# Patient Record
Sex: Male | Born: 1960 | Race: White | Hispanic: No | Marital: Single | Smoking: Former smoker
Health system: Southern US, Community
[De-identification: ages and names within clinical notes are randomized; demographics above are authoritative.]

## PROBLEM LIST (undated history)

## (undated) DIAGNOSIS — I4892 Unspecified atrial flutter: Secondary | ICD-10-CM

## (undated) DIAGNOSIS — C801 Malignant (primary) neoplasm, unspecified: Secondary | ICD-10-CM

## (undated) DIAGNOSIS — E039 Hypothyroidism, unspecified: Secondary | ICD-10-CM

## (undated) DIAGNOSIS — R0600 Dyspnea, unspecified: Secondary | ICD-10-CM

## (undated) DIAGNOSIS — E079 Disorder of thyroid, unspecified: Secondary | ICD-10-CM

## (undated) HISTORY — PX: HERNIA REPAIR: SHX51

## (undated) HISTORY — PX: APPENDECTOMY: SHX54

---

## 2005-10-11 ENCOUNTER — Ambulatory Visit: Payer: Self-pay | Admitting: Family Medicine

## 2005-10-25 ENCOUNTER — Ambulatory Visit: Payer: Self-pay | Admitting: Cardiology

## 2005-11-08 ENCOUNTER — Ambulatory Visit: Payer: Self-pay

## 2009-11-21 ENCOUNTER — Emergency Department (HOSPITAL_COMMUNITY): Admission: EM | Admit: 2009-11-21 | Discharge: 2009-11-22 | Payer: Self-pay | Admitting: Emergency Medicine

## 2010-07-24 LAB — DIFFERENTIAL
Basophils Relative: 0 % (ref 0–1)
Eosinophils Absolute: 0.4 10*3/uL (ref 0.0–0.7)
Eosinophils Relative: 4 % (ref 0–5)
Lymphocytes Relative: 20 % (ref 12–46)
Neutro Abs: 6.2 10*3/uL (ref 1.7–7.7)
Neutrophils Relative %: 68 % (ref 43–77)

## 2010-07-24 LAB — URINALYSIS, ROUTINE W REFLEX MICROSCOPIC
Bilirubin Urine: NEGATIVE
Glucose, UA: NEGATIVE mg/dL
Ketones, ur: NEGATIVE mg/dL
Protein, ur: NEGATIVE mg/dL
Specific Gravity, Urine: 1.025 (ref 1.005–1.030)

## 2010-07-24 LAB — CBC
MCH: 31.2 pg (ref 26.0–34.0)
MCHC: 34.4 g/dL (ref 30.0–36.0)
MCV: 90.7 fL (ref 78.0–100.0)
Platelets: 189 10*3/uL (ref 150–400)
RBC: 4.66 MIL/uL (ref 4.22–5.81)

## 2010-07-24 LAB — COMPREHENSIVE METABOLIC PANEL
AST: 19 U/L (ref 0–37)
BUN: 7 mg/dL (ref 6–23)
Calcium: 9.1 mg/dL (ref 8.4–10.5)
Chloride: 105 mEq/L (ref 96–112)
Creatinine, Ser: 0.94 mg/dL (ref 0.4–1.5)
Glucose, Bld: 112 mg/dL — ABNORMAL HIGH (ref 70–99)
Potassium: 3.5 mEq/L (ref 3.5–5.1)
Total Bilirubin: 0.4 mg/dL (ref 0.3–1.2)
Total Protein: 6.4 g/dL (ref 6.0–8.3)

## 2017-08-22 ENCOUNTER — Ambulatory Visit (HOSPITAL_COMMUNITY)
Admission: RE | Admit: 2017-08-22 | Discharge: 2017-08-22 | Disposition: A | Discharge status: Home / Self Care | Payer: Managed Care, Other (non HMO) | Source: Ambulatory Visit | Attending: Physician Assistant | Admitting: Physician Assistant

## 2017-08-22 ENCOUNTER — Other Ambulatory Visit (HOSPITAL_COMMUNITY): Payer: Self-pay | Admitting: Physician Assistant

## 2017-08-22 DIAGNOSIS — N50819 Testicular pain, unspecified: Secondary | ICD-10-CM

## 2017-08-22 DIAGNOSIS — N5089 Other specified disorders of the male genital organs: Secondary | ICD-10-CM

## 2017-08-24 ENCOUNTER — Other Ambulatory Visit: Payer: Self-pay

## 2017-08-24 ENCOUNTER — Encounter (HOSPITAL_COMMUNITY): Payer: Self-pay | Admitting: *Deleted

## 2017-08-24 ENCOUNTER — Emergency Department (HOSPITAL_COMMUNITY): Payer: Managed Care, Other (non HMO)

## 2017-08-24 ENCOUNTER — Observation Stay (HOSPITAL_COMMUNITY)
Admission: EM | Admit: 2017-08-24 | Discharge: 2017-08-28 | Disposition: A | Discharge status: Home / Self Care | Payer: Managed Care, Other (non HMO) | Attending: Urology | Admitting: Urology

## 2017-08-24 DIAGNOSIS — Z79899 Other long term (current) drug therapy: Secondary | ICD-10-CM | POA: Insufficient documentation

## 2017-08-24 DIAGNOSIS — L02215 Cutaneous abscess of perineum: Secondary | ICD-10-CM | POA: Diagnosis not present

## 2017-08-24 DIAGNOSIS — E039 Hypothyroidism, unspecified: Secondary | ICD-10-CM | POA: Diagnosis not present

## 2017-08-24 DIAGNOSIS — Z87891 Personal history of nicotine dependence: Secondary | ICD-10-CM | POA: Insufficient documentation

## 2017-08-24 DIAGNOSIS — B954 Other streptococcus as the cause of diseases classified elsewhere: Secondary | ICD-10-CM | POA: Diagnosis not present

## 2017-08-24 HISTORY — DX: Disorder of thyroid, unspecified: E07.9

## 2017-08-24 LAB — CBC WITH DIFFERENTIAL/PLATELET
BASOS ABS: 0 10*3/uL (ref 0.0–0.1)
BASOS PCT: 0 %
EOS ABS: 0.3 10*3/uL (ref 0.0–0.7)
EOS PCT: 2 %
HCT: 42.2 % (ref 39.0–52.0)
Hemoglobin: 14.2 g/dL (ref 13.0–17.0)
Lymphocytes Relative: 7 %
Lymphs Abs: 0.9 10*3/uL (ref 0.7–4.0)
MCH: 29.3 pg (ref 26.0–34.0)
MCHC: 33.6 g/dL (ref 30.0–36.0)
MCV: 87.2 fL (ref 78.0–100.0)
MONO ABS: 1.7 10*3/uL — AB (ref 0.1–1.0)
Monocytes Relative: 13 %
Neutro Abs: 10.6 10*3/uL — ABNORMAL HIGH (ref 1.7–7.7)
Neutrophils Relative %: 78 %
PLATELETS: 263 10*3/uL (ref 150–400)
RBC: 4.84 MIL/uL (ref 4.22–5.81)
RDW: 12.8 % (ref 11.5–15.5)
WBC: 13.5 10*3/uL — AB (ref 4.0–10.5)

## 2017-08-24 LAB — COMPREHENSIVE METABOLIC PANEL
ALBUMIN: 3.1 g/dL — AB (ref 3.5–5.0)
ALT: 43 U/L (ref 17–63)
AST: 25 U/L (ref 15–41)
Alkaline Phosphatase: 88 U/L (ref 38–126)
Anion gap: 12 (ref 5–15)
BUN: 15 mg/dL (ref 6–20)
CHLORIDE: 96 mmol/L — AB (ref 101–111)
CO2: 28 mmol/L (ref 22–32)
Calcium: 9 mg/dL (ref 8.9–10.3)
Creatinine, Ser: 0.95 mg/dL (ref 0.61–1.24)
GFR calc Af Amer: >60 mL/min (ref >60)
Glucose, Bld: 144 mg/dL — ABNORMAL HIGH (ref 65–99)
POTASSIUM: 4 mmol/L (ref 3.5–5.1)
SODIUM: 136 mmol/L (ref 135–145)
Total Bilirubin: 0.6 mg/dL (ref 0.3–1.2)
Total Protein: 7.6 g/dL (ref 6.5–8.1)

## 2017-08-24 MED ORDER — VANCOMYCIN HCL 1000 MG IV SOLR
INTRAVENOUS | Status: AC
  Filled 2017-08-24: qty 2000

## 2017-08-24 MED ORDER — MORPHINE SULFATE (PF) 4 MG/ML IV SOLN
4.0000 mg | Freq: Once | INTRAVENOUS | Status: AC
  Administered 2017-08-24: 4 mg via INTRAVENOUS
  Filled 2017-08-24: qty 1

## 2017-08-24 MED ORDER — SODIUM CHLORIDE 0.9 % IV BOLUS
1000.0000 mL | Freq: Once | INTRAVENOUS | Status: AC
  Administered 2017-08-24: 1000 mL via INTRAVENOUS

## 2017-08-24 MED ORDER — ONDANSETRON HCL 4 MG/2ML IJ SOLN
4.0000 mg | Freq: Once | INTRAMUSCULAR | Status: AC
  Administered 2017-08-24: 4 mg via INTRAVENOUS
  Filled 2017-08-24: qty 2

## 2017-08-24 MED ORDER — VANCOMYCIN HCL 10 G IV SOLR
1750.0000 mg | Freq: Once | INTRAVENOUS | Status: AC
  Administered 2017-08-24: 1750 mg via INTRAVENOUS
  Filled 2017-08-24: qty 1750

## 2017-08-24 MED ORDER — IOPAMIDOL (ISOVUE-300) INJECTION 61%
100.0000 mL | Freq: Once | INTRAVENOUS | Status: AC | PRN
  Administered 2017-08-24: 100 mL via INTRAVENOUS

## 2017-08-24 MED ORDER — VANCOMYCIN HCL 10 G IV SOLR
INTRAVENOUS | Status: AC
  Filled 2017-08-24: qty 1500

## 2017-08-24 MED ORDER — SODIUM CHLORIDE 0.9 % IV SOLN
1250.0000 mg | Freq: Two times a day (BID) | INTRAVENOUS | Status: DC
  Filled 2017-08-24: qty 1250

## 2017-08-24 MED ORDER — VANCOMYCIN HCL 500 MG IV SOLR
INTRAVENOUS | Status: AC
  Filled 2017-08-24: qty 500

## 2017-08-24 NOTE — Progress Notes (Signed)
[facility]  Abscess extending from base of penis along the scrotum and inferior to the prostate. This is more related to the penis/ scrotum and not really related to the anus/ rectum based on this CT.  Spoke with the radiologist. Spoke to the ED. Personally reviewed the images.   Curlene Labrum, MD [facility] [address] [place], [place] [address] [phone] (office)

## 2017-08-24 NOTE — Progress Notes (Signed)
[facility]  Called by ED. Patient with what appears to me to be a base of the penis and the testicles are not even on the film.   Need to see the testicles and the rectum, all of which are not on the film.  Spoke with the CT tech who is going to complete the film and call me back regarding the images. The images will be added to the prior CT and will be addendum from radiology.  Will speak with ED following this completion.   Curlene Labrum, MD [facility] [address] [place], [place] [address] [phone] (office)

## 2017-08-24 NOTE — ED Notes (Signed)
Pt transported back to ct,

## 2017-08-24 NOTE — ED Notes (Signed)
Pt returned from xray,  

## 2017-08-24 NOTE — ED Triage Notes (Signed)
Groin pain, onset a week  Ago, has been diagnosed with a hernia

## 2017-08-24 NOTE — Progress Notes (Signed)
Pharmacy Antibiotic Note  Paul Mclean is a 57 y.o. male admitted on 08/24/2017 with cellulitis and abscess.  Pharmacy has been consulted for Vancomycin dosing.  Plan: Vancomycin 1750 mg IV x 1 dose Vancomycin 1250 mg IV every 12 hours.  Goal trough 15-20 mcg/mL.  Monitor labs, c/s, and vanco trough as indicated.  Height: 5\' 7"  (170.2 cm) Weight: 210 lb (95.3 kg) IBW/kg (Calculated) : 66.1  Temp (24hrs), Avg:98.5 F (36.9 C), Min:98.5 F (36.9 C), Max:98.5 F (36.9 C)  Recent Labs  Lab 08/24/17 1634  WBC 13.5*  CREATININE 0.95    Estimated Creatinine Clearance: 96.7 mL/min (by C-G formula based on SCr of 0.95 mg/dL).    No Known Allergies  Antimicrobials this admission: Vanco 4/18 >>      Dose adjustments this admission: N/A  Microbiology results: No pending micro results  Thank you for allowing pharmacy to be a part of this patient's care.  Ramond Craver 08/24/2017 9:48 PM

## 2017-08-25 ENCOUNTER — Other Ambulatory Visit: Payer: Self-pay

## 2017-08-25 ENCOUNTER — Encounter (HOSPITAL_COMMUNITY): Payer: Self-pay | Admitting: *Deleted

## 2017-08-25 ENCOUNTER — Encounter (HOSPITAL_COMMUNITY)
Admission: EM | Disposition: A | Discharge status: Home / Self Care | Payer: Self-pay | Source: Home / Self Care | Attending: Emergency Medicine

## 2017-08-25 ENCOUNTER — Observation Stay (HOSPITAL_COMMUNITY): Payer: Managed Care, Other (non HMO) | Admitting: Certified Registered Nurse Anesthetist

## 2017-08-25 DIAGNOSIS — Z79899 Other long term (current) drug therapy: Secondary | ICD-10-CM | POA: Diagnosis not present

## 2017-08-25 DIAGNOSIS — L02215 Cutaneous abscess of perineum: Secondary | ICD-10-CM | POA: Diagnosis present

## 2017-08-25 DIAGNOSIS — E039 Hypothyroidism, unspecified: Secondary | ICD-10-CM | POA: Diagnosis not present

## 2017-08-25 DIAGNOSIS — B954 Other streptococcus as the cause of diseases classified elsewhere: Secondary | ICD-10-CM | POA: Diagnosis not present

## 2017-08-25 DIAGNOSIS — Z87891 Personal history of nicotine dependence: Secondary | ICD-10-CM | POA: Diagnosis not present

## 2017-08-25 HISTORY — PX: INCISION AND DRAINAGE ABSCESS: SHX5864

## 2017-08-25 LAB — URINALYSIS, ROUTINE W REFLEX MICROSCOPIC
BILIRUBIN URINE: NEGATIVE
Glucose, UA: NEGATIVE mg/dL
HGB URINE DIPSTICK: NEGATIVE
Ketones, ur: NEGATIVE mg/dL
Leukocytes, UA: NEGATIVE
NITRITE: NEGATIVE
PROTEIN: NEGATIVE mg/dL
pH: 5 (ref 5.0–8.0)

## 2017-08-25 SURGERY — INCISION AND DRAINAGE, ABSCESS
Anesthesia: General | Site: Perineum

## 2017-08-25 MED ORDER — DEXTROSE-NACL 5-0.45 % IV SOLN: INTRAVENOUS | Status: DC

## 2017-08-25 MED ORDER — FENTANYL CITRATE (PF) 100 MCG/2ML IJ SOLN
INTRAMUSCULAR | Status: AC
  Filled 2017-08-25: qty 2

## 2017-08-25 MED ORDER — VANCOMYCIN HCL IN DEXTROSE 1-5 GM/200ML-% IV SOLN
1000.0000 mg | Freq: Two times a day (BID) | INTRAVENOUS | Status: DC
  Administered 2017-08-25: 1000 mg via INTRAVENOUS
  Filled 2017-08-25: qty 200

## 2017-08-25 MED ORDER — OXYCODONE HCL 5 MG/5ML PO SOLN
5.0000 mg | Freq: Once | ORAL | Status: DC | PRN
  Filled 2017-08-25: qty 5

## 2017-08-25 MED ORDER — LACTATED RINGERS IV SOLN: INTRAVENOUS | Status: DC

## 2017-08-25 MED ORDER — PROPOFOL 10 MG/ML IV BOLUS
INTRAVENOUS | Status: AC
  Filled 2017-08-25: qty 20

## 2017-08-25 MED ORDER — MIDAZOLAM HCL 2 MG/2ML IJ SOLN
INTRAMUSCULAR | Status: AC
  Filled 2017-08-25: qty 2

## 2017-08-25 MED ORDER — PROMETHAZINE HCL 25 MG/ML IJ SOLN: 6.2500 mg | INTRAMUSCULAR | Status: DC | PRN

## 2017-08-25 MED ORDER — FENTANYL CITRATE (PF) 100 MCG/2ML IJ SOLN
25.0000 ug | INTRAMUSCULAR | Status: DC | PRN
  Administered 2017-08-25: 25 ug via INTRAVENOUS

## 2017-08-25 MED ORDER — SUCCINYLCHOLINE CHLORIDE 200 MG/10ML IV SOSY
PREFILLED_SYRINGE | INTRAVENOUS | Status: DC | PRN
  Administered 2017-08-25: 120 mg via INTRAVENOUS

## 2017-08-25 MED ORDER — MIDAZOLAM HCL 5 MG/5ML IJ SOLN
INTRAMUSCULAR | Status: DC | PRN
  Administered 2017-08-25: 2 mg via INTRAVENOUS

## 2017-08-25 MED ORDER — ONDANSETRON HCL 4 MG/2ML IJ SOLN: 4.0000 mg | INTRAMUSCULAR | Status: DC | PRN

## 2017-08-25 MED ORDER — SCOPOLAMINE 1 MG/3DAYS TD PT72
MEDICATED_PATCH | TRANSDERMAL | Status: AC
  Filled 2017-08-25: qty 1

## 2017-08-25 MED ORDER — DOCUSATE SODIUM 100 MG PO CAPS
100.0000 mg | ORAL_CAPSULE | Freq: Two times a day (BID) | ORAL | Status: DC
  Administered 2017-08-25 – 2017-08-27 (×6): 100 mg via ORAL
  Filled 2017-08-25 (×7): qty 1

## 2017-08-25 MED ORDER — LIDOCAINE 2% (20 MG/ML) 5 ML SYRINGE
INTRAMUSCULAR | Status: DC | PRN
  Administered 2017-08-25: 80 mg via INTRAVENOUS

## 2017-08-25 MED ORDER — LACTATED RINGERS IV SOLN
INTRAVENOUS | Status: DC | PRN
  Administered 2017-08-25: 05:00:00 via INTRAVENOUS

## 2017-08-25 MED ORDER — MORPHINE SULFATE (PF) 2 MG/ML IV SOLN
2.0000 mg | INTRAVENOUS | Status: DC | PRN
  Administered 2017-08-25 – 2017-08-27 (×3): 2 mg via INTRAVENOUS
  Filled 2017-08-25 (×3): qty 1

## 2017-08-25 MED ORDER — LIDOCAINE 2% (20 MG/ML) 5 ML SYRINGE
INTRAMUSCULAR | Status: AC
  Filled 2017-08-25: qty 5

## 2017-08-25 MED ORDER — ONDANSETRON HCL 4 MG/2ML IJ SOLN
INTRAMUSCULAR | Status: AC
  Filled 2017-08-25: qty 2

## 2017-08-25 MED ORDER — ACETAMINOPHEN 325 MG PO TABS: 650.0000 mg | ORAL_TABLET | ORAL | Status: DC | PRN

## 2017-08-25 MED ORDER — OXYCODONE HCL 5 MG PO TABS: 5.0000 mg | ORAL_TABLET | Freq: Once | ORAL | Status: DC | PRN

## 2017-08-25 MED ORDER — FENTANYL CITRATE (PF) 100 MCG/2ML IJ SOLN
INTRAMUSCULAR | Status: DC | PRN
  Administered 2017-08-25: 100 ug via INTRAVENOUS
  Administered 2017-08-25: 50 ug via INTRAVENOUS

## 2017-08-25 MED ORDER — PIPERACILLIN-TAZOBACTAM 3.375 G IVPB
3.3750 g | Freq: Three times a day (TID) | INTRAVENOUS | Status: DC
  Administered 2017-08-25 – 2017-08-28 (×8): 3.375 g via INTRAVENOUS
  Filled 2017-08-25 (×8): qty 50

## 2017-08-25 MED ORDER — SCOPOLAMINE 1 MG/3DAYS TD PT72
MEDICATED_PATCH | TRANSDERMAL | Status: DC | PRN
  Administered 2017-08-25: 1 via TRANSDERMAL

## 2017-08-25 MED ORDER — ONDANSETRON HCL 4 MG/2ML IJ SOLN
INTRAMUSCULAR | Status: DC | PRN
  Administered 2017-08-25: 4 mg via INTRAVENOUS

## 2017-08-25 MED ORDER — 0.9 % SODIUM CHLORIDE (POUR BTL) OPTIME
TOPICAL | Status: DC | PRN
  Administered 2017-08-25: 1000 mL

## 2017-08-25 MED ORDER — OXYCODONE HCL 5 MG PO TABS
5.0000 mg | ORAL_TABLET | ORAL | Status: DC | PRN
  Administered 2017-08-25 – 2017-08-28 (×3): 5 mg via ORAL
  Filled 2017-08-25 (×3): qty 1

## 2017-08-25 MED ORDER — SENNA 8.6 MG PO TABS
1.0000 | ORAL_TABLET | Freq: Two times a day (BID) | ORAL | Status: DC
  Administered 2017-08-25 – 2017-08-28 (×7): 8.6 mg via ORAL
  Filled 2017-08-25 (×7): qty 1

## 2017-08-25 MED ORDER — VANCOMYCIN HCL IN DEXTROSE 750-5 MG/150ML-% IV SOLN
750.0000 mg | Freq: Two times a day (BID) | INTRAVENOUS | Status: DC
  Administered 2017-08-25 – 2017-08-27 (×5): 750 mg via INTRAVENOUS
  Filled 2017-08-25 (×6): qty 150

## 2017-08-25 MED ORDER — PROPOFOL 10 MG/ML IV BOLUS
INTRAVENOUS | Status: DC | PRN
  Administered 2017-08-25: 150 mg via INTRAVENOUS
  Administered 2017-08-25: 50 mg via INTRAVENOUS

## 2017-08-25 SURGICAL SUPPLY — 23 items
BLADE HEX COATED 2.75 (ELECTRODE) ×2 IMPLANT
BLADE SURG 15 STRL LF DISP TIS (BLADE) ×1 IMPLANT
BLADE SURG 15 STRL SS (BLADE) ×2
COVER SURGICAL LIGHT HANDLE (MISCELLANEOUS) ×2 IMPLANT
ELECT PENCIL ROCKER SW 15FT (MISCELLANEOUS) ×2 IMPLANT
ELECT REM PT RETURN 15FT ADLT (MISCELLANEOUS) ×2 IMPLANT
GAUZE SPONGE 4X4 12PLY STRL (GAUZE/BANDAGES/DRESSINGS) ×2 IMPLANT
GAUZE SPONGE 4X4 16PLY XRAY LF (GAUZE/BANDAGES/DRESSINGS) ×2 IMPLANT
GLOVE BIOGEL PI IND STRL 7.0 (GLOVE) ×1 IMPLANT
GLOVE BIOGEL PI INDICATOR 7.0 (GLOVE) ×1
GOWN STRL REUS W/TWL LRG LVL3 (GOWN DISPOSABLE) ×2 IMPLANT
GOWN STRL REUS W/TWL XL LVL3 (GOWN DISPOSABLE) ×4 IMPLANT
KIT BASIN OR (CUSTOM PROCEDURE TRAY) ×2 IMPLANT
LUBRICANT JELLY K Y 4OZ (MISCELLANEOUS) IMPLANT
NEEDLE HYPO 25X1 1.5 SAFETY (NEEDLE) IMPLANT
PACK LITHOTOMY IV (CUSTOM PROCEDURE TRAY) ×2 IMPLANT
PAD ABD 7.5X8 STRL (GAUZE/BANDAGES/DRESSINGS) ×2 IMPLANT
SOL PREP PROV IODINE SCRUB 4OZ (MISCELLANEOUS) ×2 IMPLANT
SWAB COLLECTION DEVICE MRSA (MISCELLANEOUS) IMPLANT
SYR CONTROL 10ML LL (SYRINGE) IMPLANT
TOWEL OR 17X26 10 PK STRL BLUE (TOWEL DISPOSABLE) ×2 IMPLANT
UNDERPAD 30X30 (UNDERPADS AND DIAPERS) ×2 IMPLANT
YANKAUER SUCT BULB TIP 10FT TU (MISCELLANEOUS) ×2 IMPLANT

## 2017-08-25 NOTE — Progress Notes (Signed)
Day of Surgery Subjective: Patient reports mild perineal pain. Fever 101 overnight.   Objective: Vital signs in last 24 hours: Temp:  [97.9 F (36.6 C)-101.1 F (38.4 C)] 98.4 F (36.9 C) (04/19 1429) Pulse Rate:  [70-100] 70 (04/19 1429) Resp:  [16-26] 16 (04/19 1429) BP: (111-145)/(63-93) 111/63 (04/19 1429) SpO2:  [91 %-98 %] 98 % (04/19 1429)  Intake/Output from previous day: 04/18 0701 - 04/19 0700 In: 2750 [I.V.:1750; IV Piggyback:1000] Out: 1400 [Urine:1350; Blood:50] Intake/Output this shift: No intake/output data recorded.  Physical Exam:  General:alert, cooperative and appears stated age GI: soft, non tender, normal bowel sounds, no palpable masses, no organomegaly, no inguinal hernia Male genitalia: not done Extremities: extremities normal, atraumatic, no cyanosis or edema  Lab Results: Recent Labs    08/24/17 1634  HGB 14.2  HCT 42.2   BMET Recent Labs    08/24/17 1634  NA 136  K 4.0  CL 96*  CO2 28  GLUCOSE 144*  BUN 15  CREATININE 0.95  CALCIUM 9.0   No results for input(s): LABPT, INR in the last 72 hours. No results for input(s): LABURIN in the last 72 hours. Results for orders placed or performed during the hospital encounter of 08/24/17  Aerobic/Anaerobic Culture (surgical/deep wound)     Status: None (Preliminary result)   Collection Time: 08/25/17  5:34 AM  Result Value Ref Range Status   Specimen Description   Final    ABSCESS SCROTAL Performed at [facility] [address], [place], [place] [address]    Special Requests   Final    PATIENT ON FOLLOWING VANC/ZOSYN Performed at [facility] [address] [place], [place] [address]    Gram Stain   Final    ABUNDANT WBC PRESENT, PREDOMINANTLY PMN ABUNDANT GRAM NEGATIVE COCCOBACILLI ABUNDANT GRAM POSITIVE COCCI IN PAIRS IN CHAINS Performed at [facility], [place] [address], [place], [place] [address]    Culture PENDING  Incomplete    Report Status PENDING  Incomplete    Studies/Results: Ct Abdomen Pelvis W Contrast  Addendum Date: 08/24/2017   ADDENDUM REPORT: 08/24/2017 23:42 ADDENDUM: Additional images of the lower pelvis and scrotum were performed. These demonstrate that the abscess measures approximately 6.1 cm in craniocaudal dimension, extending along the upper aspect of the scrotum. The underlying testes appear grossly intact. Mild scrotal wall edema is noted. Electronically Signed   By: Garald Balding M.D.   On: 08/24/2017 23:42   Result Date: 08/24/2017 CLINICAL DATA:  57 y/o  M; suspected perineal abscess. EXAM: CT ABDOMEN AND PELVIS WITH CONTRAST TECHNIQUE: Multidetector CT imaging of the abdomen and pelvis was performed using the standard protocol following bolus administration of intravenous contrast. CONTRAST:  163mL ISOVUE-300 IOPAMIDOL (ISOVUE-300) INJECTION 61% COMPARISON:  None. FINDINGS: Lower chest: No acute abnormality. Hepatobiliary: No focal liver abnormality is seen. No gallstones, gallbladder wall thickening, or biliary dilatation. Pancreas: Unremarkable. No pancreatic ductal dilatation or surrounding inflammatory changes. Spleen: Normal in size without focal abnormality. Adrenals/Urinary Tract: Adrenal glands are unremarkable. Subcentimeter cysts within the upper pole of right kidney. Otherwise kidneys are normal, without renal calculi, focal lesion, or hydronephrosis. Bladder is unremarkable. Stomach/Bowel: Stomach is within normal limits. Appendix not identified, no pericecal inflammation. No evidence of bowel wall thickening, distention, or inflammatory changes. Vascular/Lymphatic: Aortic atherosclerosis. No enlarged abdominal or pelvic lymph nodes. Reproductive: Prostate is unremarkable. Other: Partially visualize multiloculated rim enhancing lesion within the left base of penis measuring 8.5 x 2.8 cm (AP by  ML series 2, image 95). Musculoskeletal: Minor scarring sutures within the right lower quadrant  anterior abdominal wall. Multiple endplate Schmorl's nodes of the spine. No acute fracture. IMPRESSION: 1. Partially visualized multiloculated rim enhancing lesion, likely abscess, within the left base of penis measuring 8.5 x 2.8 cm in axial plane. No intraperitoneal extension of inflammatory change. 2. Otherwise unremarkable CT of abdomen and pelvis. Electronically Signed: By: Kristine Garbe M.D. On: 08/24/2017 21:45    Assessment/Plan: 57yo with perineal abscess s/p I&D  1. Continue daily dressing changes 2. Voiding trial tomorrow  3. Await culture results prior to discharge   LOS: 0 days   Nicolette Bang 08/25/2017, 7:47 PM

## 2017-08-25 NOTE — Progress Notes (Signed)
Patient arrived to the floor at 0230 via [facility]. Admitting doctor was paged.

## 2017-08-25 NOTE — ED Notes (Signed)
[facility] here to transport pt,

## 2017-08-25 NOTE — Progress Notes (Signed)
RN received report from [place], [place] about patient.

## 2017-08-25 NOTE — ED Notes (Signed)
ED Provider at bedside. 

## 2017-08-25 NOTE — Transfer of Care (Signed)
Immediate Anesthesia Transfer of Care Note  Patient: Paul Mclean  Procedure(s) Performed: INCISION AND DRAINAGE perineal  ABSCESS (N/A Perineum)  Patient Location: PACU  Anesthesia Type:General  Level of Consciousness: drowsy and patient cooperative  Airway & Oxygen Therapy: Patient Spontanous Breathing and Patient connected to face mask oxygen  Post-op Assessment: Report given to RN and Post -op Vital signs reviewed and stable  Post vital signs: Reviewed and stable  Last Vitals:  Vitals Value Taken Time  BP 138/86 08/25/2017  6:04 AM  Temp    Pulse 90 08/25/2017  6:05 AM  Resp 31 08/25/2017  6:05 AM  SpO2 100 % 08/25/2017  6:05 AM  Vitals shown include unvalidated device data.  Last Pain:  Vitals:   08/25/17 0239  TempSrc: Oral  PainSc:          Complications: No apparent anesthesia complications

## 2017-08-25 NOTE — ED Provider Notes (Signed)
[facility] EMERGENCY DEPARTMENT Provider Note   CSN: [id number] Arrival date & time: 08/24/17  1527     History   Chief Complaint Chief Complaint  Patient presents with  . Inguinal Hernia    HPI Paul Mclean is a 57 y.o. male.  Level 5 caveat for urgent need for intervention.  Patient reports perineal tenderness and swelling for 1 week, getting worse.  No fever, sweats, chills, dysuria, hematuria, anterior abdominal pain.  An ultrasound was obtained approximately 1 week ago which was equivocal.  He was placed on doxycycline at that time.     History reviewed. No pertinent past medical history.  Patient Active Problem List   Diagnosis Date Noted  . Perineal abscess 08/25/2017    Past Surgical History:  Procedure Laterality Date  . APPENDECTOMY    . HERNIA REPAIR          Home Medications    Prior to Admission medications   Medication Sig Start Date End Date Taking? Authorizing Provider  acetaminophen (TYLENOL) 500 MG tablet Take 1,000 mg by mouth every 6 (six) hours as needed.   Yes [provider]  doxycycline (VIBRAMYCIN) 100 MG capsule Take 100 mg by mouth 2 (two) times daily. 10 day course starting on 08/21/2017 08/21/17  Yes [provider]  hydrOXYzine (ATARAX/VISTARIL) 10 MG tablet Take 10 mg by mouth daily. 08/10/17  Yes [provider]  ibuprofen (ADVIL,MOTRIN) 200 MG tablet Take 400 mg by mouth every 6 (six) hours as needed for moderate pain.   Yes [provider]  levothyroxine (SYNTHROID, LEVOTHROID) 100 MCG tablet Take 100 mcg by mouth daily. 06/22/17  Yes [provider]    Family History No family history on file.  Social History Social History   Tobacco Use  . Smoking status: Former Research scientist (life sciences)  . Smokeless tobacco: Never Used  Substance Use Topics  . Alcohol use: Yes  . Drug use: Never     Allergies   Patient has no known allergies.   Review of Systems Review of Systems  All other systems  reviewed and are negative.    Physical Exam Updated Vital Signs BP 135/90   Pulse 95   Temp 98.5 F (36.9 C) (Oral)   Resp 20   Ht 5\' 7"  (1.702 m)   Wt 95.3 kg (210 lb)   SpO2 96%   BMI 32.89 kg/m   Physical Exam  Constitutional: He is oriented to person, place, and time. He appears well-developed and well-nourished.  HENT:  Head: Normocephalic and atraumatic.  Eyes: Conjunctivae are normal.  Neck: Neck supple.  Cardiovascular: Normal rate and regular rhythm.  Pulmonary/Chest: Effort normal and breath sounds normal.  Abdominal: Soft. Bowel sounds are normal.  Genitourinary:  Genitourinary Comments: Firm indurated fluctuant area in the perineum adjacent to the scrotum.  No obvious testicular or scrotal involvement.  Musculoskeletal: Normal range of motion.  Neurological: He is alert and oriented to person, place, and time.  Skin: Skin is warm and dry.  Psychiatric: He has a normal mood and affect. His behavior is normal.  Nursing note and vitals reviewed.    ED Treatments / Results  Labs (all labs ordered are listed, but only abnormal results are displayed) Labs Reviewed  CBC WITH DIFFERENTIAL/PLATELET - Abnormal; Notable for the following components:      Result Value   WBC 13.5 (*)    Neutro Abs 10.6 (*)    Monocytes Absolute 1.7 (*)    All other components within normal  limits  COMPREHENSIVE METABOLIC PANEL - Abnormal; Notable for the following components:   Chloride 96 (*)    Glucose, Bld 144 (*)    Albumin 3.1 (*)    All other components within normal limits  URINALYSIS, ROUTINE W REFLEX MICROSCOPIC    EKG None  Radiology Ct Abdomen Pelvis W Contrast  Addendum Date: 08/24/2017   ADDENDUM REPORT: 08/24/2017 23:42 ADDENDUM: Additional images of the lower pelvis and scrotum were performed. These demonstrate that the abscess measures approximately 6.1 cm in craniocaudal dimension, extending along the upper aspect of the scrotum. The underlying testes appear  grossly intact. Mild scrotal wall edema is noted. Electronically Signed   By: Garald Balding M.D.   On: 08/24/2017 23:42   Result Date: 08/24/2017 CLINICAL DATA:  57 y/o  M; suspected perineal abscess. EXAM: CT ABDOMEN AND PELVIS WITH CONTRAST TECHNIQUE: Multidetector CT imaging of the abdomen and pelvis was performed using the standard protocol following bolus administration of intravenous contrast. CONTRAST:  141mL ISOVUE-300 IOPAMIDOL (ISOVUE-300) INJECTION 61% COMPARISON:  None. FINDINGS: Lower chest: No acute abnormality. Hepatobiliary: No focal liver abnormality is seen. No gallstones, gallbladder wall thickening, or biliary dilatation. Pancreas: Unremarkable. No pancreatic ductal dilatation or surrounding inflammatory changes. Spleen: Normal in size without focal abnormality. Adrenals/Urinary Tract: Adrenal glands are unremarkable. Subcentimeter cysts within the upper pole of right kidney. Otherwise kidneys are normal, without renal calculi, focal lesion, or hydronephrosis. Bladder is unremarkable. Stomach/Bowel: Stomach is within normal limits. Appendix not identified, no pericecal inflammation. No evidence of bowel wall thickening, distention, or inflammatory changes. Vascular/Lymphatic: Aortic atherosclerosis. No enlarged abdominal or pelvic lymph nodes. Reproductive: Prostate is unremarkable. Other: Partially visualize multiloculated rim enhancing lesion within the left base of penis measuring 8.5 x 2.8 cm (AP by ML series 2, image 95). Musculoskeletal: Minor scarring sutures within the right lower quadrant anterior abdominal wall. Multiple endplate Schmorl's nodes of the spine. No acute fracture. IMPRESSION: 1. Partially visualized multiloculated rim enhancing lesion, likely abscess, within the left base of penis measuring 8.5 x 2.8 cm in axial plane. No intraperitoneal extension of inflammatory change. 2. Otherwise unremarkable CT of abdomen and pelvis. Electronically Signed: By: Kristine Garbe M.D. On: 08/24/2017 21:45    Procedures Procedures (including critical care time)  Medications Ordered in ED Medications  vancomycin (VANCOCIN) 1,750 mg in sodium chloride 0.9 % 500 mL IVPB (1,750 mg Intravenous New Bag/Given 08/24/17 2209)  vancomycin (VANCOCIN) 1,250 mg in sodium chloride 0.9 % 250 mL IVPB (has no administration in time range)  ondansetron (ZOFRAN) injection 4 mg (4 mg Intravenous Given 08/24/17 2101)  sodium chloride 0.9 % bolus 1,000 mL (0 mLs Intravenous Stopped 08/24/17 2221)  morphine 4 MG/ML injection 4 mg (4 mg Intravenous Given 08/24/17 2101)  iopamidol (ISOVUE-300) 61 % injection 100 mL (100 mLs Intravenous Contrast Given 08/24/17 2121)  morphine 4 MG/ML injection 4 mg (4 mg Intravenous Given 08/24/17 2348)     Initial Impression / Assessment and Plan / ED Course  I have reviewed the triage vital signs and the nursing notes.  Pertinent labs & imaging results that were available during my care of the patient were reviewed by me and considered in my medical decision making (see chart for details).     Patient presents with perineal tenderness and inflammation.  CT scan reveals a perineal abscess.  IV vancomycin and pain management.  Discussed with both urology [Dr Eskridge] and general surgery [Dr Bridges].  Will admit to the urology service and transfer to  [facility].   CRITICAL CARE Performed by: Nat Christen Total critical care time: 40 minutes Critical care time was exclusive of separately billable procedures and treating other patients. Critical care was necessary to treat or prevent imminent or life-threatening deterioration. Critical care was time spent personally by me on the following activities: development of treatment plan with patient and/or surrogate as well as nursing, discussions with consultants, evaluation of patient's response to treatment, examination of patient, obtaining history from patient or surrogate, ordering and  performing treatments and interventions, ordering and review of laboratory studies, ordering and review of radiographic studies, pulse oximetry and re-evaluation of patient's condition.  Final Clinical Impressions(s) / ED Diagnoses   Final diagnoses:  Perineal abscess    ED Discharge Orders    None       Nat Christen, MD 08/25/17 [phone]

## 2017-08-25 NOTE — Anesthesia Procedure Notes (Signed)
Procedure Name: Intubation Date/Time: 08/25/2017 5:16 AM Performed by: Montel Clock, CRNA Pre-anesthesia Checklist: Patient identified, Emergency Drugs available, Suction available, Patient being monitored and Timeout performed Patient Re-evaluated:Patient Re-evaluated prior to induction Oxygen Delivery Method: Circle system utilized Preoxygenation: Pre-oxygenation with 100% oxygen Induction Type: IV induction, Rapid sequence and Cricoid Pressure applied Laryngoscope Size: Mac and 3 Grade View: Grade I Tube type: Oral Tube size: 7.5 mm Number of attempts: 1 Airway Equipment and Method: Stylet Placement Confirmation: ETT inserted through vocal cords under direct vision,  positive ETCO2 and breath sounds checked- equal and bilateral Secured at: 23 cm Tube secured with: Tape Dental Injury: Teeth and Oropharynx as per pre-operative assessment

## 2017-08-25 NOTE — ED Notes (Signed)
Pt's wife Kenney Houseman notified of pt's transfer Contact information  [phone]

## 2017-08-25 NOTE — Progress Notes (Signed)
Pharmacy Antibiotic Note  Paul Mclean is a 57 y.o. male presented to the ED on 08/24/2017 with c/o groin pain. He was found to have perineal abscess and subsequently underwent I&D on 4/19.  Vancomycin and zosyn started on admission for infection.  Today, 08/25/2017: - Day #1 abx - Tmax 101.1, wbc 13.5 on 4/18 -  scr 0.95 (crcl~95)   Plan: - continue  Zosyn 3.375 gm IV q8h (infuse over 4 hours).  With good renal function, will sign off for zosyn - Adjust Vancomycin to 750 mg IV q12h for est AUC 411 (goal 400-500) - monitor renal function closely - f/u cx  ______________________________________  Height: 5\' 7"  (170.2 cm) Weight: 210 lb (95.3 kg) IBW/kg (Calculated) : 66.1  Temp (24hrs), Avg:99 F (37.2 C), Min:97.9 F (36.6 C), Max:101.1 F (38.4 C)  Recent Labs  Lab 08/24/17 1634  WBC 13.5*  CREATININE 0.95    Estimated Creatinine Clearance: 96.7 mL/min (by C-G formula based on SCr of 0.95 mg/dL).    No Known Allergies  Antimicrobials this admission: 4/18 vancomycin >>  4/18 zosyn >>   Microbiology results: 4/19 abscess cx:   Thank you for allowing pharmacy to be a part of this patient's care.  Lynelle Doctor 08/25/2017 11:01 AM

## 2017-08-25 NOTE — Progress Notes (Signed)
Pharmacy Antibiotic Note  Paul Mclean is a 57 y.o. male admitted on 08/24/2017 with cellulitis and abscess.  Pharmacy has been consulted for Vancomycin and Zosyn.   Plan: ZEI Vancomycin 1750 mg IV x 1 dose Vancomycin 1 gm IV q12h for est AUC = 547 Goal AUC = 400-500  Monitor labs, c/s, and vanco trough as indicated. Daily Scr  Height: 5\' 7"  (170.2 cm) Weight: 210 lb (95.3 kg) IBW/kg (Calculated) : 66.1  Temp (24hrs), Avg:98.7 F (37.1 C), Min:98.4 F (36.9 C), Max:99.1 F (37.3 C)  Recent Labs  Lab 08/24/17 1634  WBC 13.5*  CREATININE 0.95    Estimated Creatinine Clearance: 96.7 mL/min (by C-G formula based on SCr of 0.95 mg/dL).    No Known Allergies  Antimicrobials this admission: Vanco 4/18 >>  Zosyn 4/17 >>     Dose adjustments this admission: N/A  Microbiology results: No pending micro results  Thank you for allowing pharmacy to be a part of this patient's care.  Dorrene German 08/25/2017 4:58 AM

## 2017-08-25 NOTE — Progress Notes (Signed)
Emotional and tearful wife of patient called inquiring about patient status. She states if possible she would like a home health nurse the first couple of days when he comes home to assist and teach dressing changes. Wife also would like doctors to keep her in the loop as the patient is "groggy" and not telling her how he is doing.

## 2017-08-25 NOTE — Progress Notes (Addendum)
Nutrition Brief Note  Patient identified on the Malnutrition Screening Tool (MST) Report. Pt reports having a good appetite currently but that his appetite was poor the day prior to admission due to pain. Pt states he thinks he has lost weight and reports UBW as 210 lbs. Pt unsure when he last weighed 210 lbs.  Pt declines any oral nutrition supplements at this time. Pt is eager to eat.  Addendum: NFPE performed. No subcutaneous fat or muscle wasting noted.  Wt Readings from Last 15 Encounters:  08/24/17 210 lb (95.3 kg)    Body mass index is 32.89 kg/m. Patient meets criteria for Obesity Class I based on current BMI.   Current diet order is NPO s/p I&D of perineal abscess. Labs and medications reviewed.   No nutrition interventions warranted at this time. If nutrition issues arise, please consult RD.   Gaynell Face, MS, RD, LDN Pager: [phone] Weekend/After Hours: [phone]

## 2017-08-25 NOTE — H&P (Signed)
Urology H+P Note   Admitting Attending: Dr. Junious Silk  Chief Complaint:  Perineal abscess  HPI: Paul Mclean is a 57 y.o. male who presents for evaluation of perineal pain.  This began about a week ago, at which point he noted swelling and tenderness in his perineum.  No active drainage or redness noted.  He presented to his PCP, who obtained a scrotal ultrasound, which revealed a possible hernia, but no fluid collection.  He was prescribed doxycycline, but his symptoms progressed until today, where he had worsening of his pain, swelling, and noted spreading to his scrotum.  No fevers, chills, nausea, vomiting.  No difficulty with bowel movements, though does endorse some straining with urination recently.  No baseline urinary symptoms.  He presented to the ED, where he was found to have normal labs and vital signs.  A CT was performed, which revealed a 6.1 cm abscess in the perineum, involving the posterior scrotum and proximal penis.  Surgery was called, who evaluated the patient, and did not believe that his rectum was involved.  The patient received vancomycin in the ED, and was transferred to [facility] for urologic evaluation.   Past Medical History: Past Medical History:  Diagnosis Date  . Thyroid disease     Past Surgical History:  Past Surgical History:  Procedure Laterality Date  . APPENDECTOMY    . HERNIA REPAIR      Medication: Current Facility-Administered Medications  Medication Dose Route Frequency Provider Last Rate Last Dose  . acetaminophen (TYLENOL) tablet 650 mg  650 mg Oral Q4H PRN Stasia Cavalier, MD      . dextrose 5 %-0.45 % sodium chloride infusion   Intravenous Continuous Stasia Cavalier, MD      . docusate sodium (COLACE) capsule 100 mg  100 mg Oral BID Stasia Cavalier, MD      . morphine 2 MG/ML injection 2 mg  2 mg Intravenous Q3H PRN Stasia Cavalier, MD      . ondansetron [facility]) injection 4 mg  4 mg Intravenous Q4H PRN  Stasia Cavalier, MD      . oxyCODONE (Oxy IR/ROXICODONE) immediate release tablet 5 mg  5 mg Oral Q4H PRN Stasia Cavalier, MD      . piperacillin-tazobactam (ZOSYN) IVPB 3.375 g  3.375 g Intravenous Q8H Stasia Cavalier, MD      . senna [facility]) tablet 8.6 mg  1 tablet Oral BID Stasia Cavalier, MD      . vancomycin (VANCOCIN) 1,250 mg in sodium chloride 0.9 % 250 mL IVPB  1,250 mg Intravenous Q12H Nat Christen, MD        Allergies: No Known Allergies  Social History: Social History   Tobacco Use  . Smoking status: Former Research scientist (life sciences)  . Smokeless tobacco: Never Used  Substance Use Topics  . Alcohol use: Yes  . Drug use: Never    Family History Family History  Problem Relation Age of Onset  . Cancer Mother     Review of Systems 10 systems were reviewed and are negative except as noted specifically in the HPI.  Objective   Vital signs in last 24 hours: BP (!) 145/79 (BP Location: Right Arm)   Pulse 94   Temp 98.4 F (36.9 C) (Oral)   Resp 20   Ht 5\' 7"  (1.702 m)   Wt 95.3 kg (210 lb)   SpO2 94%   BMI 32.89 kg/m   Physical Exam General: NAD, A&O, resting, appropriate HEENT:  [place]/AT, EOMI, MMM Pulmonary: Normal work of breathing Cardiovascular: HDS, adequate peripheral perfusion Abdomen: Soft, NTTP, nondistended. GU: Normal circumcised penis.  Scrotal wall edema present, more significantly posteriorly.  The perineum, there is a large, fluctuant, tender fluid collection, which does not appear to involve the rectum.  No drainage noted actively. Extremities: warm and well perfused Neuro: Appropriate, no focal neurological deficits  Most Recent Labs: Lab Results  Component Value Date   WBC 13.5 (H) 08/24/2017   HGB 14.2 08/24/2017   HCT 42.2 08/24/2017   PLT 263 08/24/2017    Lab Results  Component Value Date   NA 136 08/24/2017   K 4.0 08/24/2017   CL 96 (L) 08/24/2017   CO2 28 08/24/2017   BUN 15 08/24/2017   CREATININE 0.95 08/24/2017    CALCIUM 9.0 08/24/2017    No results found for: INR, APTT   IMAGING: Ct Abdomen Pelvis W Contrast  Addendum Date: 08/24/2017   ADDENDUM REPORT: 08/24/2017 23:42 ADDENDUM: Additional images of the lower pelvis and scrotum were performed. These demonstrate that the abscess measures approximately 6.1 cm in craniocaudal dimension, extending along the upper aspect of the scrotum. The underlying testes appear grossly intact. Mild scrotal wall edema is noted. Electronically Signed   By: Garald Balding M.D.   On: 08/24/2017 23:42   Result Date: 08/24/2017 CLINICAL DATA:  57 y/o  M; suspected perineal abscess. EXAM: CT ABDOMEN AND PELVIS WITH CONTRAST TECHNIQUE: Multidetector CT imaging of the abdomen and pelvis was performed using the standard protocol following bolus administration of intravenous contrast. CONTRAST:  166mL ISOVUE-300 IOPAMIDOL (ISOVUE-300) INJECTION 61% COMPARISON:  None. FINDINGS: Lower chest: No acute abnormality. Hepatobiliary: No focal liver abnormality is seen. No gallstones, gallbladder wall thickening, or biliary dilatation. Pancreas: Unremarkable. No pancreatic ductal dilatation or surrounding inflammatory changes. Spleen: Normal in size without focal abnormality. Adrenals/Urinary Tract: Adrenal glands are unremarkable. Subcentimeter cysts within the upper pole of right kidney. Otherwise kidneys are normal, without renal calculi, focal lesion, or hydronephrosis. Bladder is unremarkable. Stomach/Bowel: Stomach is within normal limits. Appendix not identified, no pericecal inflammation. No evidence of bowel wall thickening, distention, or inflammatory changes. Vascular/Lymphatic: Aortic atherosclerosis. No enlarged abdominal or pelvic lymph nodes. Reproductive: Prostate is unremarkable. Other: Partially visualize multiloculated rim enhancing lesion within the left base of penis measuring 8.5 x 2.8 cm (AP by ML series 2, image 95). Musculoskeletal: Minor scarring sutures within the right  lower quadrant anterior abdominal wall. Multiple endplate Schmorl's nodes of the spine. No acute fracture. IMPRESSION: 1. Partially visualized multiloculated rim enhancing lesion, likely abscess, within the left base of penis measuring 8.5 x 2.8 cm in axial plane. No intraperitoneal extension of inflammatory change. 2. Otherwise unremarkable CT of abdomen and pelvis. Electronically Signed: By: Kristine Garbe M.D. On: 08/24/2017 21:45    ------  Assessment:  57 y.o. male with a 6cm perineal abscess involving the posterior scrotum and base of penis, developing for about 1 week despite doxycycline.  I discussed with the patient the etiology, natural history, and treatment options for this abscess.  We discussed the need for drainage.  We discussed how this is done and the risks of the procedure including bleeding, re-formation of the abscess, and injury to other structures in the perineum including the urethra.  The patient understands the risks and is eager to proceed.  We discussed the need for packing of the abscess cavity postoperatively, and he believes his wife will be able to do this.   Plan: - NPO  for I+D of perineal abscess - Continue vancomycin and Zosyn postop. Transition to PO abx depending on cultures.

## 2017-08-25 NOTE — Op Note (Addendum)
Preoperative Diagnosis: Perineal abscess  Postoperative Diagnosis: Same  Procedure(s): INCISION AND DRAINAGE PERINEAL ABSCESS  Surgeon:  Surgeon(s): Festus Aloe, MD  Resident Surgeon: Stasia Cavalier, MD  Anesthesia:  General  Fluids:  See anesthesia record  Estimated blood loss:  Minimal   Specimens:   ID Type Source Tests Collected by Time Destination  A : scrotal abcess aerobic anaerobic culture gram stain Abscess Abscess GRAM STAIN, AEROBIC/ANAEROBIC CULTURE (SURGICAL/DEEP WOUND) Festus Aloe, MD 08/25/2017 0534     Drains: 56 French two-way Foley catheter  Complications:  None  Indications:  This is a 57 y.o. patient with a 8.5 cm perineal abscess, appearing on CT to involve the perineum and posterior scrotum and track along the bulbar urethra/corpora. After discussion of the risks & benefits and alternatives to surgical approach, the patient wishes to proceed with I+D of this abscess. Risks & benefits of the procedure discussed with the patient, who wishes to proceed.   Findings: 3 cm midline perineal incision made, with evacuation of about 100 cc of purulent fluid, sent for culture.  Wound thoroughly explored about 1 cm inferior and 5 cm superiorly to the left and right of the bulb of the penis. The tissues were viable and healthy. No necrotizing infection. The cavity was evacuated and thoroughly irrigated. Did not appear to involve the scrotum, rectum, or urethra. The scrotum was spared with only simple edema. Wound packed with Kerlix.  Procedure:  The patient was brought to the OR, and general anesthesia was induced.  He was placed in the dorsal lithotomy position, and prepped and draped in the normal sterile fashion.  A timeout was performed, and a 16 French catheter was placed with 10 cc in the balloon.Marland Kitchen  He was given vancomycin and Zosyn as perioperative antibiotics.  A 3 cm midline perineal incision was made, and we dissected down through subcutaneous  tissue until we encountered the abscess cavity.  We evacuated about 100 cc of purulent fluid, which was sent for culture.  We thoroughly explored the abscess cavity.  The abscess did not appear to involve the rectum, urethra, or scrotum.  No evidence of necrosis within the abscess cavity to indicate a necrotizing infection. The bulbospongiosus appeared normal and the catheter was palpable. The wound was thoroughly irrigated with about 1 L of normal saline.  The cavity was packed with moist Kerlix, ensuring to thoroughly fill the wound.  This was covered with an ABD pad and mesh underwear.  His catheter was placed to a drainage bag.  There were no apparent intraoperative complications.  Hemostasis was excellent at the end of the case.  The patient tolerated the procedure well, and was extubated in the OR.  He was transported to the PACU in stable condition.

## 2017-08-25 NOTE — ED Notes (Signed)
Bladder scan performed, results of >976ml shown, pt was able to urinate, emptied 400cc from bladder, repeat bladder scan revealed 750 ml.

## 2017-08-25 NOTE — ED Notes (Signed)
Patient transported to CT 

## 2017-08-25 NOTE — Anesthesia Preprocedure Evaluation (Addendum)
Anesthesia Evaluation  Patient identified by MRN, date of birth, ID band Patient awake    Reviewed: Allergy & Precautions, NPO status , Patient's Chart, lab work & pertinent test results  Airway Mallampati: II   Neck ROM: Full    Dental  (+) Dental Advisory Given, Teeth Intact   Pulmonary former smoker,    breath sounds clear to auscultation       Cardiovascular negative cardio ROS   Rhythm:Regular Rate:Tachycardia     Neuro/Psych negative neurological ROS  negative psych ROS   GI/Hepatic negative GI ROS, Neg liver ROS,   Endo/Other  Hypothyroidism Obesity  Renal/GU negative Renal ROS  negative genitourinary   Musculoskeletal negative musculoskeletal ROS (+)   Abdominal   Peds  Hematology negative hematology ROS (+)   Anesthesia Other Findings   Reproductive/Obstetrics                            Anesthesia Physical Anesthesia Plan  ASA: II and emergent  Anesthesia Plan: General   Post-op Pain Management:    Induction: Intravenous, Rapid sequence and Cricoid pressure planned  PONV Risk Score and Plan: 2 and Treatment may vary due to age or medical condition, Ondansetron, Midazolam and Scopolamine patch - Pre-op  Airway Management Planned: Oral ETT  Additional Equipment: None  Intra-op Plan:   Post-operative Plan: Extubation in OR  Informed Consent: I have reviewed the patients History and Physical, chart, labs and discussed the procedure including the risks, benefits and alternatives for the proposed anesthesia with the patient or authorized representative who has indicated his/her understanding and acceptance.   Dental advisory given  Plan Discussed with: CRNA and Anesthesiologist  Anesthesia Plan Comments: (Will place OGT to suction stomach after intubation given recent ingestion)        Anesthesia Quick Evaluation

## 2017-08-26 LAB — CBC
HCT: 35.5 % — ABNORMAL LOW (ref 39.0–52.0)
Hemoglobin: 11.8 g/dL — ABNORMAL LOW (ref 13.0–17.0)
MCH: 29.4 pg (ref 26.0–34.0)
MCHC: 33.2 g/dL (ref 30.0–36.0)
MCV: 88.3 fL (ref 78.0–100.0)
PLATELETS: 278 10*3/uL (ref 150–400)
RBC: 4.02 MIL/uL — AB (ref 4.22–5.81)
RDW: 13.2 % (ref 11.5–15.5)
WBC: 10.7 10*3/uL — ABNORMAL HIGH (ref 4.0–10.5)

## 2017-08-26 LAB — BASIC METABOLIC PANEL
Anion gap: 10 (ref 5–15)
BUN: 10 mg/dL (ref 6–20)
CALCIUM: 8.4 mg/dL — AB (ref 8.9–10.3)
CO2: 28 mmol/L (ref 22–32)
CREATININE: 1.03 mg/dL (ref 0.61–1.24)
Chloride: 99 mmol/L — ABNORMAL LOW (ref 101–111)
GLUCOSE: 115 mg/dL — AB (ref 65–99)
Potassium: 4.1 mmol/L (ref 3.5–5.1)
Sodium: 137 mmol/L (ref 135–145)

## 2017-08-26 LAB — HIV ANTIBODY (ROUTINE TESTING W REFLEX): HIV Screen 4th Generation wRfx: NONREACTIVE

## 2017-08-26 NOTE — Progress Notes (Signed)
1 Day Post-Op Subjective: No acute events overnight.  Pain improved this morning.  Tolerating dressing change last night.  He has been afebrile over the past shift.  Pain with ambulation and needs assistance.   Objective: Vital signs in last 24 hours: Temp:  [97.7 F (36.5 C)-98.4 F (36.9 C)] 97.7 F (36.5 C) (04/20 0520) Pulse Rate:  [66-70] 69 (04/20 0520) Resp:  [16] 16 (04/20 0520) BP: (109-131)/(59-78) 109/59 (04/20 0520) SpO2:  [94 %-98 %] 94 % (04/20 0520)  Intake/Output from previous day: 04/19 0701 - 04/20 0700 In: 490 [P.O.:240; IV Piggyback:250] Out: 4100 [Urine:4100]  Intake/Output this shift: No intake/output data recorded.  Physical Exam:  General: Alert and oriented CV: RRR, palpable distal pulses Lungs: CTAB, equal chest rise Abdomen: Soft, NTND, no rebound or guarding GU: perineal wound packed with intact dressings.  No evidence of spreading erythema, cellulitis or necrosis. Ext: NT, No erythema  Lab Results: Recent Labs    08/24/17 1634 08/26/17 0357  HGB 14.2 11.8*  HCT 42.2 35.5*   BMET Recent Labs    08/24/17 1634 08/26/17 0357  NA 136 137  K 4.0 4.1  CL 96* 99*  CO2 28 28  GLUCOSE 144* 115*  BUN 15 10  CREATININE 0.95 1.03  CALCIUM 9.0 8.4*     Studies/Results: Ct Abdomen Pelvis W Contrast  Addendum Date: 08/24/2017   ADDENDUM REPORT: 08/24/2017 23:42 ADDENDUM: Additional images of the lower pelvis and scrotum were performed. These demonstrate that the abscess measures approximately 6.1 cm in craniocaudal dimension, extending along the upper aspect of the scrotum. The underlying testes appear grossly intact. Mild scrotal wall edema is noted. Electronically Signed   By: Garald Balding M.D.   On: 08/24/2017 23:42   Result Date: 08/24/2017 CLINICAL DATA:  57 y/o  M; suspected perineal abscess. EXAM: CT ABDOMEN AND PELVIS WITH CONTRAST TECHNIQUE: Multidetector CT imaging of the abdomen and pelvis was performed using the standard protocol  following bolus administration of intravenous contrast. CONTRAST:  163mL ISOVUE-300 IOPAMIDOL (ISOVUE-300) INJECTION 61% COMPARISON:  None. FINDINGS: Lower chest: No acute abnormality. Hepatobiliary: No focal liver abnormality is seen. No gallstones, gallbladder wall thickening, or biliary dilatation. Pancreas: Unremarkable. No pancreatic ductal dilatation or surrounding inflammatory changes. Spleen: Normal in size without focal abnormality. Adrenals/Urinary Tract: Adrenal glands are unremarkable. Subcentimeter cysts within the upper pole of right kidney. Otherwise kidneys are normal, without renal calculi, focal lesion, or hydronephrosis. Bladder is unremarkable. Stomach/Bowel: Stomach is within normal limits. Appendix not identified, no pericecal inflammation. No evidence of bowel wall thickening, distention, or inflammatory changes. Vascular/Lymphatic: Aortic atherosclerosis. No enlarged abdominal or pelvic lymph nodes. Reproductive: Prostate is unremarkable. Other: Partially visualize multiloculated rim enhancing lesion within the left base of penis measuring 8.5 x 2.8 cm (AP by ML series 2, image 95). Musculoskeletal: Minor scarring sutures within the right lower quadrant anterior abdominal wall. Multiple endplate Schmorl's nodes of the spine. No acute fracture. IMPRESSION: 1. Partially visualized multiloculated rim enhancing lesion, likely abscess, within the left base of penis measuring 8.5 x 2.8 cm in axial plane. No intraperitoneal extension of inflammatory change. 2. Otherwise unremarkable CT of abdomen and pelvis. Electronically Signed: By: Kristine Garbe M.D. On: 08/24/2017 21:45    Assessment/Plan:  POD1 s/p perineal abscess I&D  -Wound cxs pending-- continue vanc and zosyn.  Improving clinically -Continue daily dressing changes/wound packing -Keep Foley in place until the patient is more mobile. -H/H consulted for wound care at home -Will continue to monitor  [facility]  Lovena Neighbours,  [facility] Pager: [phone]  08/26/2017, 10:23 AM

## 2017-08-27 LAB — CBC
HCT: 40.1 % (ref 39.0–52.0)
Hemoglobin: 13.4 g/dL (ref 13.0–17.0)
MCH: 29.3 pg (ref 26.0–34.0)
MCHC: 33.4 g/dL (ref 30.0–36.0)
MCV: 87.7 fL (ref 78.0–100.0)
PLATELETS: 359 10*3/uL (ref 150–400)
RBC: 4.57 MIL/uL (ref 4.22–5.81)
RDW: 12.9 % (ref 11.5–15.5)
WBC: 8 10*3/uL (ref 4.0–10.5)

## 2017-08-27 LAB — BASIC METABOLIC PANEL
Anion gap: 11 (ref 5–15)
BUN: 9 mg/dL (ref 6–20)
CALCIUM: 8.6 mg/dL — AB (ref 8.9–10.3)
CHLORIDE: 100 mmol/L — AB (ref 101–111)
CO2: 26 mmol/L (ref 22–32)
CREATININE: 0.89 mg/dL (ref 0.61–1.24)
Glucose, Bld: 117 mg/dL — ABNORMAL HIGH (ref 65–99)
Potassium: 3.8 mmol/L (ref 3.5–5.1)
SODIUM: 137 mmol/L (ref 135–145)

## 2017-08-27 NOTE — Anesthesia Postprocedure Evaluation (Signed)
Anesthesia Post Note  Patient: Paul Mclean  Procedure(s) Performed: INCISION AND DRAINAGE perineal  ABSCESS (N/A Perineum)     Patient location during evaluation: PACU Anesthesia Type: General Level of consciousness: awake and alert Pain management: pain level controlled Vital Signs Assessment: post-procedure vital signs reviewed and stable Respiratory status: spontaneous breathing, nonlabored ventilation and respiratory function stable Cardiovascular status: blood pressure returned to baseline and stable Postop Assessment: no apparent nausea or vomiting Anesthetic complications: no    Last Vitals:  Vitals:   08/27/17 0513 08/27/17 1441  BP: 115/75 136/69  Pulse: 70 68  Resp: 14   Temp: 36.7 C 36.7 C  SpO2: 95% 99%    Last Pain:  Vitals:   08/27/17 1613  TempSrc:   PainSc: 0-No pain                 Audry Pili

## 2017-08-27 NOTE — Progress Notes (Signed)
2 Days Post-Op Subjective: No acute events over the past shift.  Perineal pain improved.    Objective: Vital signs in last 24 hours: Temp:  [97.5 F (36.4 C)-98.3 F (36.8 C)] 98 F (36.7 C) (04/21 0513) Pulse Rate:  [66-72] 70 (04/21 0513) Resp:  [14-16] 14 (04/21 0513) BP: (115-124)/(75-82) 115/75 (04/21 0513) SpO2:  [95 %-96 %] 95 % (04/21 0513)  Intake/Output from previous day: 04/20 0701 - 04/21 0700 In: 860 [P.O.:860] Out: 3350 [Urine:3350]  Intake/Output this shift: Total I/O In: 740 [P.O.:240; IV Piggyback:500] Out: 600 [Urine:600]  Physical Exam:  General: Alert and oriented CV: RRR, palpable distal pulses Lungs: CTAB, equal chest rise Abdomen: Soft, NTND, no rebound or guarding GU: perineal wound packed with intact dressings.  No evidence of spreading erythema, cellulitis or necrosis. Ext: NT, No erythema    Lab Results: Recent Labs    08/24/17 1634 08/26/17 0357 08/27/17 0449  HGB 14.2 11.8* 13.4  HCT 42.2 35.5* 40.1   BMET Recent Labs    08/26/17 0357 08/27/17 0449  NA 137 137  K 4.1 3.8  CL 99* 100*  CO2 28 26  GLUCOSE 115* 117*  BUN 10 9  CREATININE 1.03 0.89  CALCIUM 8.4* 8.6*     Studies/Results: No results found.  Assessment/Plan: POD2 s/p perineal abscess I&D  -d/c Foley for VT  -Wound cxs pending-- continue vanc and zosyn.  Improving clinically -Continue daily dressing changes/wound packing -Keep Foley in place until the patient is more mobile. -Home health consulted for wound care at home -Will continue to monitor    LOS: 0 days   Ellison Hughs, MD [facility] Pager: [phone]  08/27/2017, 11:24 AM

## 2017-08-27 NOTE — Plan of Care (Signed)
Plan of care discussed.   

## 2017-08-28 LAB — BASIC METABOLIC PANEL
Anion gap: 9 (ref 5–15)
BUN: 11 mg/dL (ref 6–20)
CHLORIDE: 104 mmol/L (ref 101–111)
CO2: 29 mmol/L (ref 22–32)
CREATININE: 0.97 mg/dL (ref 0.61–1.24)
Calcium: 8.7 mg/dL — ABNORMAL LOW (ref 8.9–10.3)
GFR calc non Af Amer: >60 mL/min (ref >60)
Glucose, Bld: 113 mg/dL — ABNORMAL HIGH (ref 65–99)
POTASSIUM: 3.9 mmol/L (ref 3.5–5.1)
SODIUM: 142 mmol/L (ref 135–145)

## 2017-08-28 LAB — CBC
HEMATOCRIT: 37.1 % — AB (ref 39.0–52.0)
Hemoglobin: 12.2 g/dL — ABNORMAL LOW (ref 13.0–17.0)
MCH: 29 pg (ref 26.0–34.0)
MCHC: 32.9 g/dL (ref 30.0–36.0)
MCV: 88.3 fL (ref 78.0–100.0)
PLATELETS: 342 10*3/uL (ref 150–400)
RBC: 4.2 MIL/uL — AB (ref 4.22–5.81)
RDW: 13 % (ref 11.5–15.5)
WBC: 6.1 10*3/uL (ref 4.0–10.5)

## 2017-08-28 MED ORDER — TRAMADOL HCL 50 MG PO TABS: 50.0000 mg | ORAL_TABLET | Freq: Four times a day (QID) | ORAL | 0 refills | Status: DC | PRN

## 2017-08-28 MED ORDER — AMOXICILLIN-POT CLAVULANATE 875-125 MG PO TABS: 1.0000 | ORAL_TABLET | Freq: Two times a day (BID) | ORAL | 0 refills | Status: AC

## 2017-08-28 NOTE — Discharge Instructions (Signed)
Wound Care:  What to Expect at [place] your dressing once a day or more often if the gauze is drying out.  As the wound heals, you should not need as much gauze or packing gauze. Removing the Old Dressing Follow these steps to remove your dressing: Wash your hands thoroughly with soap and warm water before and after each dressing change.  Put on a pair of non-sterile gloves.  Carefully remove the tape.  Remove the old dressing. If it is sticking to your skin, wet it with warm water to loosen it.  Remove the gauze pads or packing tape from inside your wound.  Put the old dressing, packing material, and your gloves in a plastic bag. Set the bag aside.  Cleaning Your Wound Follow these steps to clean your wound: Put on a new pair of non-sterile gloves.  Use a clean, soft washcloth to gently clean your wound with warm water and soap. Your wound should not bleed much when you are cleaning it. A small amount of blood is OK.  Rinse your wound with water. Gently pat it dry with a clean towel. DO NOT rub it dry. In some cases, you can even rinse the wound while showering.  Check the wound for increased redness, swelling, or a bad odor.  Pay attention to the color and amount of drainage from your wound. Look for drainage that has become darker or thicker.  After cleaning your wound, remove your gloves and put them in the plastic bag with the old dressing and gloves.  Wash your hands again.  Changing Your Dressing Follow these steps to put a new dressing on: Put on a new pair of non-sterile gloves.  Pour saline into a clean bowl. Place gauze pads and any packing tape you will use in the bowl.  Squeeze the saline from the gauze pads or packing tape until it is no longer dripping.  Place the gauze pads or packing tape in your wound. Carefully fill in the wound and any spaces under the skin.  Cover the wet gauze or packing tape with a large dry dressing pad. Use tape or rolled gauze to hold this  dressing in place.  Put all used supplies in the plastic bag. Close it securely, then put it in a second plastic bag, and close that bag securely. Put it in the trash.  Wash your hands again when you are finished.  When to Call the Doctor Call your doctor if you have any of these changes around your wound: Worsening redness  More pain  Swelling  Bleeding  It is larger or deeper  It looks dried out or dark  The drainage is increasing  The drainage has a bad smell  Also call your doctor if: Your temperature is 100.69F (38C), or higher   Drainage is coming from or around the wound  Drainage is not decreasing after 3 to 5 days  Drainage is increasing  Drainage becomes thick, tan, yellow, or smells bad

## 2017-08-28 NOTE — Progress Notes (Signed)
Discharge planning, spoke with patient at beside. Chose [facility] for [facility] services, RN to assist with wound care. Wife will be teachable caregiver. Contacted [facility] for referral.  [phone]

## 2017-08-28 NOTE — Progress Notes (Signed)
Discharge instructions reviewed with patient. All questions answered. Wound care taught to patient, dressing supplies given to patient. All questions answered. Patient ambulated to vehicle with belongings by nurse tech.

## 2017-08-28 NOTE — Discharge Summary (Signed)
Physician Discharge Summary  Patient ID: Paul Mclean MRN: [id number] DOB/AGE: January 11, 1961 57 y.o.  Admit date: 08/24/2017 Discharge date: 08/28/2017  Admission Diagnoses: Perineal abscess  Discharge Diagnoses:  Active Problems:   Perineal abscess   Discharged Condition: good  Hospital Course: Patient was admitted following incision and drainage of about an 8 cm perineal abscess.  He is undergone wet-to-dry dressing changes and remained afebrile with stable vital signs.  The wound is contracting appropriately and he is ready for discharge.  Consults: None  Significant Diagnostic Studies: none  Treatments: surgery: incision and drainage perineal abscess  Discharge Exam: Blood pressure (!) 137/99, pulse 70, temperature 98.2 F (36.8 C), temperature source Oral, resp. rate 13, height 5\' 7"  (1.702 m), weight 95.3 kg (210 lb), SpO2 98 %. No acute distress-he is watching TV Neurologic- no focal deficits Respiratory-regular effort and depth Abdomen-soft and nontender Extremities-no swelling or calf pain GU- penis normal without mass or lesion.  Mild scrotal edema.  Perineal incision is clean with wet gauze.  Look at the edges shows healthy granulation tissue.  No fluctuance about the penis, scrotum or perineum.  No erythema.  Disposition: Discharge disposition: 01-Home or Self Care        Allergies as of 08/28/2017   No Known Allergies     Medication List    STOP taking these medications   doxycycline 100 MG capsule Commonly known as:  VIBRAMYCIN     TAKE these medications   acetaminophen 500 MG tablet Commonly known as:  TYLENOL Take 1,000 mg by mouth every 6 (six) hours as needed.   amoxicillin-clavulanate 875-125 MG tablet Commonly known as:  AUGMENTIN Take 1 tablet by mouth 2 (two) times daily for 7 days.   hydrOXYzine 10 MG tablet Commonly known as:  ATARAX/VISTARIL Take 10 mg by mouth daily.   ibuprofen 200 MG tablet Commonly known as:  ADVIL,MOTRIN Take  400 mg by mouth every 6 (six) hours as needed for moderate pain.   levothyroxine 100 MCG tablet Commonly known as:  SYNTHROID, LEVOTHROID Take 100 mcg by mouth daily.   traMADol 50 MG tablet Commonly known as:  ULTRAM Take 1 tablet (50 mg total) by mouth every 6 (six) hours as needed.      Follow-up Information    Festus Aloe, MD Follow up in 1 week(s).   Specialty:  Urology Contact information: [place] [address] [phone]           Signed: Festus Aloe 08/28/2017, 8:55 AM

## 2017-08-29 LAB — AEROBIC/ANAEROBIC CULTURE W GRAM STAIN (SURGICAL/DEEP WOUND)

## 2017-08-29 LAB — AEROBIC/ANAEROBIC CULTURE (SURGICAL/DEEP WOUND)

## 2017-09-05 ENCOUNTER — Ambulatory Visit: Payer: Managed Care, Other (non HMO) | Admitting: General Surgery

## 2018-04-26 ENCOUNTER — Encounter: Payer: Self-pay | Admitting: General Surgery

## 2018-04-26 ENCOUNTER — Ambulatory Visit: Payer: Managed Care, Other (non HMO) | Admitting: General Surgery

## 2018-04-26 VITALS — BP 149/92 | HR 67 | Temp 98.2°F | Resp 18 | Wt 208.0 lb

## 2018-04-26 DIAGNOSIS — K4090 Unilateral inguinal hernia, without obstruction or gangrene, not specified as recurrent: Secondary | ICD-10-CM

## 2018-04-26 NOTE — Progress Notes (Signed)
Paul Mclean; [id number]; [birth year]   HPI Patient is a 57 year old white male who was referred to my care by Lars Mage for evaluation treatment of a possible left inguinal hernia.  Patient states that this was found on physical examination several months ago.  He denies any pain or swelling in the left inguinal region.  His only complaint is that his left testicle seems somewhat numb.  He does not have numbness in the left inner thigh or scrotum.  He also states that the testicles do not seem to be level.  His left testicle is slightly more elevated than his right testicle.  Is not made worse with straining.  He denies any nausea or vomiting.  He has 0 out of 10 pain in the left groin region.  It is not made worse with straining.  He is status post left inguinal herniorrhaphy over 25 years ago. Past Medical History:  Diagnosis Date  . Thyroid disease     Past Surgical History:  Procedure Laterality Date  . APPENDECTOMY    . HERNIA REPAIR    . INCISION AND DRAINAGE ABSCESS N/A 08/25/2017   Procedure: INCISION AND DRAINAGE perineal  ABSCESS;  Surgeon: Festus Aloe, MD;  Location: [facility];  Service: Urology;  Laterality: N/A;    Family History  Problem Relation Age of Onset  . Cancer Mother     Current Outpatient Medications on File Prior to Visit  Medication Sig Dispense Refill  . levothyroxine (SYNTHROID, LEVOTHROID) 100 MCG tablet Take 100 mcg by mouth daily.     No current facility-administered medications on file prior to visit.     No Known Allergies  Social History   Substance and Sexual Activity  Alcohol Use Yes    Social History   Tobacco Use  Smoking Status Former Smoker  Smokeless Tobacco Never Used    Review of Systems  Constitutional: Negative.   HENT: Negative.   Eyes: Negative.   Respiratory: Negative.   Cardiovascular: Negative.   Gastrointestinal: Negative.   Genitourinary: Negative.   Musculoskeletal: Negative.   Skin: Negative.    Neurological: Negative.   Endo/Heme/Allergies: Negative.   Psychiatric/Behavioral: Negative.     Objective   Vitals:   04/26/18 0941  BP: (!) 149/92  Pulse: 67  Resp: 18  Temp: 98.2 F (36.8 C)    Physical Exam Vitals signs reviewed.  Constitutional:      Appearance: Normal appearance. He is normal weight. He is not ill-appearing.  HENT:     Head: Normocephalic and atraumatic.  Cardiovascular:     Rate and Rhythm: Normal rate and regular rhythm.     Heart sounds: Normal heart sounds. No murmur. No gallop.   Pulmonary:     Effort: Pulmonary effort is normal. No respiratory distress.     Breath sounds: Normal breath sounds. No stridor. No wheezing, rhonchi or rales.  Abdominal:     General: Abdomen is flat. Bowel sounds are normal. There is no distension.     Palpations: Abdomen is soft. There is no mass.     Tenderness: There is no abdominal tenderness. There is no guarding.     Hernia: A hernia is present.     Comments: Surgical scar present in the left groin region.  Small left inguinal hernia present at pubic tubercle.  Easily reducible.  Genitourinary:    Penis: Normal.      Scrotum/Testes: Normal.  Skin:    General: Skin is warm and dry.  Neurological:  Mental Status: He is alert and oriented to person, place, and time.    Primary care notes reviewed Assessment  Recurrent left inguinal hernia, asymptomatic. I did explain to the patient that his symptoms of numbness in the left testicle are probably not related to the hernia.  This would be more explained by age and scarring from the previous surgery.  The testicles are otherwise normal. Plan   No need for surgical intervention at this time.  The signs and symptoms of incarceration were explained to the patient.  He was instructed to follow-up with my office should he develop the symptoms.  Follow-up here as needed.

## 2018-04-26 NOTE — Patient Instructions (Signed)

## 2020-08-10 ENCOUNTER — Encounter (HOSPITAL_COMMUNITY): Payer: Self-pay | Admitting: Emergency Medicine

## 2020-08-10 ENCOUNTER — Other Ambulatory Visit: Payer: Self-pay

## 2020-08-10 ENCOUNTER — Emergency Department (HOSPITAL_COMMUNITY): Payer: Managed Care, Other (non HMO)

## 2020-08-10 ENCOUNTER — Emergency Department (HOSPITAL_COMMUNITY)
Admission: EM | Admit: 2020-08-10 | Discharge: 2020-08-11 | Disposition: A | Discharge status: Home / Self Care | Payer: Managed Care, Other (non HMO) | Attending: Emergency Medicine | Admitting: Emergency Medicine

## 2020-08-10 DIAGNOSIS — J189 Pneumonia, unspecified organism: Secondary | ICD-10-CM | POA: Insufficient documentation

## 2020-08-10 DIAGNOSIS — Z87891 Personal history of nicotine dependence: Secondary | ICD-10-CM | POA: Insufficient documentation

## 2020-08-10 DIAGNOSIS — R0789 Other chest pain: Secondary | ICD-10-CM

## 2020-08-10 DIAGNOSIS — R079 Chest pain, unspecified: Secondary | ICD-10-CM | POA: Diagnosis present

## 2020-08-10 LAB — BASIC METABOLIC PANEL
Anion gap: 10 (ref 5–15)
BUN: 14 mg/dL (ref 6–20)
CO2: 23 mmol/L (ref 22–32)
Calcium: 8.3 mg/dL — ABNORMAL LOW (ref 8.9–10.3)
Chloride: 100 mmol/L (ref 98–111)
Creatinine, Ser: 0.86 mg/dL (ref 0.61–1.24)
GFR, Estimated: >60 mL/min (ref >60)
Glucose, Bld: 99 mg/dL (ref 70–99)
Potassium: 3.4 mmol/L — ABNORMAL LOW (ref 3.5–5.1)
Sodium: 133 mmol/L — ABNORMAL LOW (ref 135–145)

## 2020-08-10 LAB — CBC
HCT: 40.7 % (ref 39.0–52.0)
Hemoglobin: 13.6 g/dL (ref 13.0–17.0)
MCH: 29.8 pg (ref 26.0–34.0)
MCHC: 33.4 g/dL (ref 30.0–36.0)
MCV: 89.1 fL (ref 80.0–100.0)
Platelets: 216 10*3/uL (ref 150–400)
RBC: 4.57 MIL/uL (ref 4.22–5.81)
RDW: 12.3 % (ref 11.5–15.5)
WBC: 10.6 10*3/uL — ABNORMAL HIGH (ref 4.0–10.5)
nRBC: 0 % (ref 0.0–0.2)

## 2020-08-10 LAB — TROPONIN I (HIGH SENSITIVITY): Troponin I (High Sensitivity): 24 ng/L — ABNORMAL HIGH (ref <18)

## 2020-08-10 LAB — D-DIMER, QUANTITATIVE: D-Dimer, Quant: 0.42 ug/mL-FEU (ref 0.00–0.50)

## 2020-08-10 MED ORDER — DOXYCYCLINE HYCLATE 100 MG PO TABS
100.0000 mg | ORAL_TABLET | Freq: Once | ORAL | Status: AC
  Administered 2020-08-11: 100 mg via ORAL
  Filled 2020-08-10: qty 1

## 2020-08-10 MED ORDER — IOHEXOL 350 MG/ML SOLN
100.0000 mL | Freq: Once | INTRAVENOUS | Status: AC | PRN
  Administered 2020-08-10: 100 mL via INTRAVENOUS

## 2020-08-10 MED ORDER — MORPHINE SULFATE (PF) 4 MG/ML IV SOLN
4.0000 mg | Freq: Once | INTRAVENOUS | Status: AC
  Administered 2020-08-10: 4 mg via INTRAVENOUS
  Filled 2020-08-10: qty 1

## 2020-08-10 MED ORDER — ONDANSETRON HCL 4 MG/2ML IJ SOLN
4.0000 mg | Freq: Once | INTRAMUSCULAR | Status: AC
  Administered 2020-08-10: 4 mg via INTRAVENOUS
  Filled 2020-08-10: qty 2

## 2020-08-10 MED ORDER — KETOROLAC TROMETHAMINE 30 MG/ML IJ SOLN
30.0000 mg | Freq: Once | INTRAMUSCULAR | Status: AC
  Administered 2020-08-10: 30 mg via INTRAVENOUS
  Filled 2020-08-10: qty 1

## 2020-08-10 NOTE — ED Notes (Signed)
Pt transported to CT at this time.

## 2020-08-10 NOTE — ED Notes (Signed)
Pt returned from CT °

## 2020-08-10 NOTE — ED Provider Notes (Addendum)
[facility] EMERGENCY DEPARTMENT Provider Note   CSN: [id number] Arrival date & time: 08/10/20  1909     History Chief Complaint  Patient presents with  . Chest Pain    Paul Mclean is a 60 y.o. male.  Pt presents to the ED today with cp.  The pt said his pain is when he takes a deep breath.  Sx started today around 2 pm.  He was given asa and nitro by EMS and said it did not change his sx.        Past Medical History:  Diagnosis Date  . Thyroid disease     Patient Active Problem List   Diagnosis Date Noted  . Perineal abscess 08/25/2017    Past Surgical History:  Procedure Laterality Date  . APPENDECTOMY    . HERNIA REPAIR    . INCISION AND DRAINAGE ABSCESS N/A 08/25/2017   Procedure: INCISION AND DRAINAGE perineal  ABSCESS;  Surgeon: Festus Aloe, MD;  Location: [facility];  Service: Urology;  Laterality: N/A;       Family History  Problem Relation Age of Onset  . Cancer Mother     Social History   Tobacco Use  . Smoking status: Former Research scientist (life sciences)  . Smokeless tobacco: Never Used  Vaping Use  . Vaping Use: Never used  Substance Use Topics  . Alcohol use: Yes    Comment: few beers everyday  . Drug use: Never    Home Medications Prior to Admission medications   Medication Sig Start Date End Date Taking? Authorizing Provider  doxycycline (VIBRAMYCIN) 100 MG capsule Take 1 capsule (100 mg total) by mouth 2 (two) times daily. 08/11/20  Yes Isla Pence, MD  HYDROcodone-acetaminophen (NORCO/VICODIN) 5-325 MG tablet Take 1 tablet by mouth every 4 (four) hours as needed. 08/11/20  Yes Isla Pence, MD  levothyroxine (SYNTHROID, LEVOTHROID) 100 MCG tablet Take 100 mcg by mouth daily. 06/22/17   [provider]    Allergies    Patient has no known allergies.  Review of Systems   Review of Systems  Respiratory: Positive for shortness of breath.   Cardiovascular: Positive for chest pain.  All other systems reviewed and are negative.   Physical  Exam Updated Vital Signs BP 112/90 (BP Location: Left Arm)   Pulse 79   Temp 98.4 F (36.9 C) (Oral)   Resp 18   Ht 5\' 8"  (1.727 m)   Wt 89.8 kg   SpO2 95%   BMI 30.11 kg/m   Physical Exam Vitals and nursing note reviewed.  Constitutional:      Appearance: He is well-developed.  HENT:     Head: Normocephalic and atraumatic.  Eyes:     Extraocular Movements: Extraocular movements intact.     Pupils: Pupils are equal, round, and reactive to light.  Cardiovascular:     Rate and Rhythm: Normal rate and regular rhythm.     Heart sounds: Normal heart sounds.  Pulmonary:     Effort: Pulmonary effort is normal.     Breath sounds: Normal breath sounds.  Abdominal:     General: Bowel sounds are normal.     Palpations: Abdomen is soft.  Musculoskeletal:        General: Normal range of motion.     Cervical back: Normal range of motion and neck supple.  Skin:    General: Skin is warm.     Capillary Refill: Capillary refill takes less than 2 seconds.  Neurological:     General: No focal  deficit present.     Mental Status: He is alert. He is disoriented.     ED Results / Procedures / Treatments   Labs (all labs ordered are listed, but only abnormal results are displayed) Labs Reviewed  BASIC METABOLIC PANEL - Abnormal; Notable for the following components:      Result Value   Sodium 133 (*)    Potassium 3.4 (*)    Calcium 8.3 (*)    All other components within normal limits  CBC - Abnormal; Notable for the following components:   WBC 10.6 (*)    All other components within normal limits  TROPONIN I (HIGH SENSITIVITY) - Abnormal; Notable for the following components:   Troponin I (High Sensitivity) 24 (*)    All other components within normal limits  D-DIMER, QUANTITATIVE  TROPONIN I (HIGH SENSITIVITY)    EKG EKG Interpretation  Date/Time:  Monday August 10 2020 19:19:59 EDT Ventricular Rate:  74 PR Interval:  154 QRS Duration: 93 QT Interval:  399 QTC  Calculation: 443 R Axis:   73 Text Interpretation: Sinus rhythm ST elevation, consider anterior injury No significant change since last tracing Confirmed by Isla Pence [phone]) on 08/10/2020 8:28:27 PM   Radiology CT Angio Chest PE W and/or Wo Contrast  Result Date: 08/10/2020 CLINICAL DATA:  Chest pain EXAM: CT ANGIOGRAPHY CHEST WITH CONTRAST TECHNIQUE: Multidetector CT imaging of the chest was performed using the standard protocol during bolus administration of intravenous contrast. Multiplanar CT image reconstructions and MIPs were obtained to evaluate the vascular anatomy. CONTRAST:  147mL OMNIPAQUE IOHEXOL 350 MG/ML SOLN COMPARISON:  None. FINDINGS: Cardiovascular: There is a optimal opacification of the pulmonary arteries. There is no central,segmental, or subsegmental filling defects within the pulmonary arteries. There is mild cardiomegaly. No pericardial effusion or thickening. No evidence right heart strain. There is normal three-vessel brachiocephalic anatomy without proximal stenosis. The thoracic aorta is normal in appearance. Mediastinum/Nodes: Ill-defined mass seen within the right hila at the right upper lobe anteriorly measuring approximately 6.2 x 2.7 cm. The mass obstructs the anterior right upper lobe bronchials Lungs/Pleura: As described above anterior right hilar/upper lobe numbness is seen. There is thickening along the right minor fissure. A small right pleural effusion is seen. There appears to be adjacent reticulonodular opacities and interlobular septal thickening within the right upper lobe. Upper Abdomen: No acute abnormalities present in the visualized portions of the upper abdomen. Musculoskeletal: No chest wall abnormality. No acute or significant osseous findings. Review of the MIP images confirms the above findings. IMPRESSION: 1. No central, segmental, or subsegmental pulmonary embolism. 2. Anterior right upper lobe mass extending into the hilum measuring 6.2 x 2.7 cm  concerning for a pneumonia, and cannot exclude underlying pulmonary neoplasm. 3. Adjacent reticulonodular opacities which could be due to postobstructive infectious or inflammatory process 4. Small right pleural effusion Electronically Signed   By: Prudencio Pair M.D.   On: 08/10/2020 23:44   DG Chest Port 1 View  Result Date: 08/10/2020 CLINICAL DATA:  Shortness of breath and chest pain EXAM: PORTABLE CHEST 1 VIEW COMPARISON:  None. FINDINGS: The heart size and mediastinal contours are within normal limits. Prominence of the central pulmonary vasculature is seen. The visualized skeletal structures are unremarkable. IMPRESSION: Mild pulmonary vascular congestion Electronically Signed   By: Prudencio Pair M.D.   On: 08/10/2020 20:07    Procedures Procedures   Medications Ordered in ED Medications  doxycycline (VIBRA-TABS) tablet 100 mg (has no administration in time range)  morphine  4 MG/ML injection 4 mg (4 mg Intravenous Given 08/10/20 1941)  ondansetron (ZOFRAN) injection 4 mg (4 mg Intravenous Given 08/10/20 1941)  ketorolac (TORADOL) 30 MG/ML injection 30 mg (30 mg Intravenous Given 08/10/20 2317)  iohexol (OMNIPAQUE) 350 MG/ML injection 100 mL (100 mLs Intravenous Contrast Given 08/10/20 2325)    ED Course  I have reviewed the triage vital signs and the nursing notes.  Pertinent labs & imaging results that were available during my care of the patient were reviewed by me and considered in my medical decision making (see chart for details).    MDM Rules/Calculators/A&P                          CP is only when he takes a deep breath.  CXR does show some mild vascular congestion.  CT chest shows a pneumonia vs lung mass.  Pt does have a hx of smoking, but quit 10 years ago.  Pt will be referred to pulmonology.  Final Clinical Impression(s) / ED Diagnoses Final diagnoses:  Atypical chest pain  Community acquired pneumonia of right lung, unspecified part of lung    Rx / DC Orders ED Discharge  Orders         Ordered    Ambulatory referral to Pulmonology        08/11/20 0001    doxycycline (VIBRAMYCIN) 100 MG capsule  2 times daily        08/11/20 0003    HYDROcodone-acetaminophen (NORCO/VICODIN) 5-325 MG tablet  Every 4 hours PRN        08/11/20 0003           Isla Pence, MD 08/10/20 1191    Isla Pence, MD 08/11/20 0003

## 2020-08-10 NOTE — ED Triage Notes (Signed)
Pt has had gradual chest pain all day with sob. Ems gave pt 324mg  aspirin and 2 sl nitro pain has not changed. Ems placed pt on o2 at 4 lpm due to o2 sat 90% at home.

## 2020-08-11 LAB — TROPONIN I (HIGH SENSITIVITY): Troponin I (High Sensitivity): 17 ng/L (ref <18)

## 2020-08-11 MED ORDER — DOXYCYCLINE HYCLATE 100 MG PO CAPS: 100.0000 mg | ORAL_CAPSULE | Freq: Two times a day (BID) | ORAL | 0 refills | Status: DC

## 2020-08-11 MED ORDER — HYDROCODONE-ACETAMINOPHEN 5-325 MG PO TABS: 1.0000 | ORAL_TABLET | ORAL | 0 refills | Status: DC | PRN

## 2020-08-20 ENCOUNTER — Telehealth: Payer: Self-pay | Admitting: Pulmonary Disease

## 2020-08-20 DIAGNOSIS — R918 Other nonspecific abnormal finding of lung field: Secondary | ICD-10-CM

## 2020-08-20 NOTE — Telephone Encounter (Signed)
I have scheduled the following & the patient has been made aware:  PET @ [facility] 4/18 @ 12, check in by 11:30 COVID Test 4/22 @ [facility] in [place] EBUS @ [facility] - 4/26 @ 1 PM, check in by 10:30.

## 2020-08-20 NOTE — Telephone Encounter (Signed)
PCCM:  I called and spoke with patient regarding CT results.   We will plan for PET imaging and biopsy of the lesion.   I have ordered the PET, hopefully to be completed next week.   Tentative bronchoscopy for 09/01/2020.  [facility] 08/20/2020 11:20 AM

## 2020-08-24 ENCOUNTER — Ambulatory Visit (HOSPITAL_COMMUNITY): Payer: Managed Care, Other (non HMO)

## 2020-08-24 ENCOUNTER — Telehealth: Payer: Self-pay | Admitting: Pulmonary Disease

## 2020-08-24 NOTE — Telephone Encounter (Signed)
Pt's PET scan has been rescheduled to 4/27 for his insurance prior auth.  This was originally scheduled for 4/18.  Pt's EBUS was scheduled for 4/26 @ 1.  Pt has asked me to call Crista Luria at [phone] to discuss movement of appointments.  I had to leave VM.     Dr. Valeta Harms - does the EBUS need to be moved out to after the PET scan?

## 2020-08-25 NOTE — Telephone Encounter (Signed)
LVM on Paul Mclean' VM.

## 2020-08-25 NOTE — Telephone Encounter (Signed)
Okay to keep EBUS as ordered.  Discussed with Icard  Thanks, Erskine Emery

## 2020-08-25 NOTE — Telephone Encounter (Signed)
Provided all updates to pt & Crista Luria.  Nothing further needed at this time.

## 2020-08-28 ENCOUNTER — Other Ambulatory Visit (HOSPITAL_COMMUNITY)
Admission: RE | Admit: 2020-08-28 | Discharge: 2020-08-28 | Disposition: A | Discharge status: Home / Self Care | Payer: Managed Care, Other (non HMO) | Source: Ambulatory Visit | Attending: Pulmonary Disease | Admitting: Pulmonary Disease

## 2020-08-28 DIAGNOSIS — Z20822 Contact with and (suspected) exposure to covid-19: Secondary | ICD-10-CM | POA: Insufficient documentation

## 2020-08-28 DIAGNOSIS — Z01812 Encounter for preprocedural laboratory examination: Secondary | ICD-10-CM | POA: Insufficient documentation

## 2020-08-29 LAB — SARS CORONAVIRUS 2 (TAT 6-24 HRS): SARS Coronavirus 2: NEGATIVE

## 2020-08-30 ENCOUNTER — Encounter (HOSPITAL_COMMUNITY): Payer: Self-pay | Admitting: Pulmonary Disease

## 2020-08-30 NOTE — Progress Notes (Signed)
Pre-op instructions given to pt. Npo @ midnight.Denies chest/sob.

## 2020-09-01 ENCOUNTER — Other Ambulatory Visit: Payer: Self-pay

## 2020-09-01 ENCOUNTER — Ambulatory Visit (HOSPITAL_COMMUNITY)
Admission: RE | Admit: 2020-09-01 | Discharge: 2020-09-01 | Disposition: A | Discharge status: Home / Self Care | Payer: Managed Care, Other (non HMO) | Attending: Pulmonary Disease | Admitting: Pulmonary Disease

## 2020-09-01 ENCOUNTER — Ambulatory Visit (HOSPITAL_COMMUNITY): Payer: Managed Care, Other (non HMO) | Admitting: Anesthesiology

## 2020-09-01 ENCOUNTER — Ambulatory Visit (HOSPITAL_COMMUNITY): Payer: Managed Care, Other (non HMO)

## 2020-09-01 ENCOUNTER — Encounter (HOSPITAL_COMMUNITY): Payer: Self-pay | Admitting: Pulmonary Disease

## 2020-09-01 ENCOUNTER — Encounter (HOSPITAL_COMMUNITY)
Admission: RE | Disposition: A | Discharge status: Home / Self Care | Payer: Self-pay | Source: Home / Self Care | Attending: Pulmonary Disease

## 2020-09-01 DIAGNOSIS — R599 Enlarged lymph nodes, unspecified: Secondary | ICD-10-CM | POA: Diagnosis not present

## 2020-09-01 DIAGNOSIS — R918 Other nonspecific abnormal finding of lung field: Secondary | ICD-10-CM | POA: Diagnosis present

## 2020-09-01 DIAGNOSIS — C801 Malignant (primary) neoplasm, unspecified: Secondary | ICD-10-CM | POA: Diagnosis not present

## 2020-09-01 DIAGNOSIS — Z87891 Personal history of nicotine dependence: Secondary | ICD-10-CM | POA: Diagnosis not present

## 2020-09-01 DIAGNOSIS — Z9889 Other specified postprocedural states: Secondary | ICD-10-CM

## 2020-09-01 DIAGNOSIS — C771 Secondary and unspecified malignant neoplasm of intrathoracic lymph nodes: Secondary | ICD-10-CM | POA: Insufficient documentation

## 2020-09-01 HISTORY — PX: VIDEO BRONCHOSCOPY WITH ENDOBRONCHIAL ULTRASOUND: SHX6177

## 2020-09-01 HISTORY — PX: BRONCHIAL BRUSHINGS: SHX5108

## 2020-09-01 HISTORY — PX: BRONCHIAL BIOPSY: SHX5109

## 2020-09-01 HISTORY — DX: Hypothyroidism, unspecified: E03.9

## 2020-09-01 HISTORY — PX: BRONCHIAL NEEDLE ASPIRATION BIOPSY: SHX5106

## 2020-09-01 HISTORY — PX: BRONCHIAL WASHINGS: SHX5105

## 2020-09-01 SURGERY — BRONCHOSCOPY, WITH EBUS
Anesthesia: General

## 2020-09-01 MED ORDER — FENTANYL CITRATE (PF) 100 MCG/2ML IJ SOLN
INTRAMUSCULAR | Status: DC | PRN
  Administered 2020-09-01: 100 ug via INTRAVENOUS

## 2020-09-01 MED ORDER — OXYCODONE HCL 5 MG/5ML PO SOLN: 5.0000 mg | Freq: Once | ORAL | Status: DC | PRN

## 2020-09-01 MED ORDER — OXYCODONE HCL 5 MG PO TABS: 5.0000 mg | ORAL_TABLET | Freq: Once | ORAL | Status: DC | PRN

## 2020-09-01 MED ORDER — PHENYLEPHRINE 40 MCG/ML (10ML) SYRINGE FOR IV PUSH (FOR BLOOD PRESSURE SUPPORT)
PREFILLED_SYRINGE | INTRAVENOUS | Status: DC | PRN
  Administered 2020-09-01: 80 ug via INTRAVENOUS

## 2020-09-01 MED ORDER — AMISULPRIDE (ANTIEMETIC) 5 MG/2ML IV SOLN: 10.0000 mg | Freq: Once | INTRAVENOUS | Status: DC | PRN

## 2020-09-01 MED ORDER — LIDOCAINE 2% (20 MG/ML) 5 ML SYRINGE
INTRAMUSCULAR | Status: DC | PRN
  Administered 2020-09-01: 100 mg via INTRAVENOUS

## 2020-09-01 MED ORDER — LACTATED RINGERS IV SOLN: INTRAVENOUS | Status: DC

## 2020-09-01 MED ORDER — ROCURONIUM BROMIDE 10 MG/ML (PF) SYRINGE
PREFILLED_SYRINGE | INTRAVENOUS | Status: DC | PRN
  Administered 2020-09-01: 60 mg via INTRAVENOUS

## 2020-09-01 MED ORDER — CHLORHEXIDINE GLUCONATE 0.12 % MT SOLN
OROMUCOSAL | Status: AC
  Administered 2020-09-01: 15 mL via OROMUCOSAL
  Filled 2020-09-01: qty 15

## 2020-09-01 MED ORDER — ONDANSETRON HCL 4 MG/2ML IJ SOLN
INTRAMUSCULAR | Status: DC | PRN
  Administered 2020-09-01: 4 mg via INTRAVENOUS

## 2020-09-01 MED ORDER — CHLORHEXIDINE GLUCONATE 0.12 % MT SOLN: 15.0000 mL | Freq: Once | OROMUCOSAL | Status: AC

## 2020-09-01 MED ORDER — DEXAMETHASONE SODIUM PHOSPHATE 10 MG/ML IJ SOLN
INTRAMUSCULAR | Status: DC | PRN
  Administered 2020-09-01: 10 mg via INTRAVENOUS

## 2020-09-01 MED ORDER — PROPOFOL 10 MG/ML IV BOLUS
INTRAVENOUS | Status: DC | PRN
  Administered 2020-09-01: 200 mg via INTRAVENOUS

## 2020-09-01 MED ORDER — ONDANSETRON HCL 4 MG/2ML IJ SOLN: 4.0000 mg | Freq: Once | INTRAMUSCULAR | Status: DC | PRN

## 2020-09-01 MED ORDER — SUGAMMADEX SODIUM 200 MG/2ML IV SOLN
INTRAVENOUS | Status: DC | PRN
  Administered 2020-09-01: 200 mg via INTRAVENOUS

## 2020-09-01 MED ORDER — MIDAZOLAM HCL 5 MG/5ML IJ SOLN
INTRAMUSCULAR | Status: DC | PRN
  Administered 2020-09-01: 2 mg via INTRAVENOUS

## 2020-09-01 MED ORDER — FENTANYL CITRATE (PF) 100 MCG/2ML IJ SOLN: 25.0000 ug | INTRAMUSCULAR | Status: DC | PRN

## 2020-09-01 SURGICAL SUPPLY — 29 items
BRUSH CYTOL CELLEBRITY 1.5X140 (MISCELLANEOUS) IMPLANT
CANISTER SUCT 3000ML PPV (MISCELLANEOUS) ×3 IMPLANT
CONT SPEC 4OZ CLIKSEAL STRL BL (MISCELLANEOUS) ×3 IMPLANT
COVER BACK TABLE 60X90IN (DRAPES) ×3 IMPLANT
COVER DOME SNAP 22 D (MISCELLANEOUS) ×3 IMPLANT
FORCEPS BIOP RJ4 1.8 (CUTTING FORCEPS) IMPLANT
GAUZE SPONGE 4X4 12PLY STRL (GAUZE/BANDAGES/DRESSINGS) ×3 IMPLANT
GLOVE BIO SURGEON STRL SZ7.5 (GLOVE) ×3 IMPLANT
GOWN STRL REUS W/ TWL LRG LVL3 (GOWN DISPOSABLE) ×2 IMPLANT
GOWN STRL REUS W/TWL LRG LVL3 (GOWN DISPOSABLE) ×3
KIT CLEAN ENDO COMPLIANCE (KITS) ×6 IMPLANT
KIT TURNOVER KIT B (KITS) ×3 IMPLANT
MARKER SKIN DUAL TIP RULER LAB (MISCELLANEOUS) ×3 IMPLANT
NEEDLE EBUS SONO TIP PENTAX (NEEDLE) ×3 IMPLANT
NS IRRIG 1000ML POUR BTL (IV SOLUTION) ×3 IMPLANT
OIL SILICONE PENTAX (PARTS (SERVICE/REPAIRS)) ×3 IMPLANT
PAD ARMBOARD 7.5X6 YLW CONV (MISCELLANEOUS) ×6 IMPLANT
SOL ANTI FOG 6CC (MISCELLANEOUS) ×2 IMPLANT
SOLUTION ANTI FOG 6CC (MISCELLANEOUS) ×1
SYR 20CC LL (SYRINGE) ×6 IMPLANT
SYR 20ML ECCENTRIC (SYRINGE) ×6 IMPLANT
SYR 50ML SLIP (SYRINGE) IMPLANT
SYR 5ML LUER SLIP (SYRINGE) ×3 IMPLANT
TOWEL OR 17X24 6PK STRL BLUE (TOWEL DISPOSABLE) ×3 IMPLANT
TRAP SPECIMEN MUCOUS 40CC (MISCELLANEOUS) IMPLANT
TUBE CONNECTING 20X1/4 (TUBING) ×6 IMPLANT
UNDERPAD 30X30 (UNDERPADS AND DIAPERS) ×3 IMPLANT
VALVE DISPOSABLE (MISCELLANEOUS) ×3 IMPLANT
WATER STERILE IRR 1000ML POUR (IV SOLUTION) ×3 IMPLANT

## 2020-09-01 NOTE — Op Note (Signed)
Video Bronchoscopy with Endobronchial Ultrasound Procedure Note Video bronchoscopy with endobronchial biopsies and brushing procedure note  Date of Operation: 09/01/2020  Pre-op Diagnosis: Right upper lobe lung mass, adenopathy  Post-op Diagnosis: Right upper lobe lung mass, adenopathy  Surgeon: Garner Nash, DO   Assistants: None   Anesthesia: General endotracheal anesthesia  Operation: Flexible video fiberoptic bronchoscopy with endobronchial ultrasound and biopsies.  Estimated Blood Loss: Minimal  Complications: None   Indications and History: Paul Mclean is a 60 y.o. male with right upper lobe lung mass, adenopathy.  The risks, benefits, complications, treatment options and expected outcomes were discussed with the patient.  The possibilities of pneumothorax, pneumonia, reaction to medication, pulmonary aspiration, perforation of a viscus, bleeding, failure to diagnose a condition and creating a complication requiring transfusion or operation were discussed with the patient who freely signed the consent.    Description of Procedure: The patient was examined in the preoperative area and history and data from the preprocedure consultation were reviewed. It was deemed appropriate to proceed.  The patient was taken to [facility] endoscopy room 2, identified as [name] and the procedure verified as Flexible Video Fiberoptic Bronchoscopy.  A Time Out was held and the above information confirmed. After being taken to the operating room general anesthesia was initiated and the patient  was orally intubated. The video fiberoptic bronchoscope was introduced via the endotracheal tube and a general inspection was performed which showed normal right and left lung anatomy, there was visible mounding irregular mucosa and submucosal tumor infiltration coming from the right upper lobe distal airway bifurcations between the anterior and posterior subsegments.  Tumor was visualized under narrowband  imaging. The standard scope was then withdrawn and the endobronchial ultrasound was used to identify and characterize the peritracheal, hilar and bronchial lymph nodes. Inspection showed enlarged station 10 R hilar lymph node. Using real-time ultrasound guidance Wang needle biopsies were take from Station 10 R nodes and were sent for cytology.  The endobronchial ultrasound component of the procedure we transition back to the therapeutic bronchoscope.  Using the therapeutic bronchoscope we passed cytology brushings into the right upper lobe endobronchial tumor visible within the right upper lobe.  We also used 2.8 mm [employer] forceps to obtain endobronchial biopsies.  Saline was used for irrigation and hemostasis and clot removal.  We then completed a large-volume right upper lobe BAL to be sent for cytology.  Using the standard therapeutic bronchoscope we completed aspiration of the bilateral mainstem's remove any remaining debris and secretions.  All distal subsegments were patent except for the right upper lobe anterior segment which had table clot formation. The patient tolerated the procedure well without apparent complications. There was no significant blood loss. The bronchoscope was withdrawn. Anesthesia was reversed and the patient was taken to the PACU for recovery.   Samples: 1. Wang needle biopsies from station 10R/Right hilar node 2.  Right upper lobe cytology brushings 3.  Right upper lobe endobronchial biopsies  Plans:  The patient will be discharged from the PACU to home when recovered from anesthesia. We will review the cytology, pathology results with the patient when they become available. Outpatient followup will be with Garner Nash, DO.   Garner Nash, DO [facility] 09/01/2020 2:24 PM

## 2020-09-01 NOTE — Consult Note (Signed)
Synopsis: Referred in April 2022 for lung mass by Dr. Earlie Server  Subjective:   PATIENT ID: Paul Paul GENDER: male DOB: [birth year], MRN: [id number]  Chief complaint: Abnormal CT imaging lung mass  This is a 60 year old gentleman past medical history of tobacco abuse, hypothyroidism patient initially seen in the emergency department on 08/10/2020 pulmonary was consulted for recommendations and management.  Patient was initially referred to thoracic surgery.  Called and spoke with patient regarding CT imaging and recommendations for tissue biopsy.  Patient had CT scan on 08/10/2020 which revealed a 6 x 2 cm right hilar mass concerning for malignancy.     Past Medical History:  Diagnosis Date  . Hypothyroidism   . Thyroid disease      Family History  Problem Relation Age of Onset  . Cancer Mother      Past Surgical History:  Procedure Laterality Date  . APPENDECTOMY    . HERNIA REPAIR    . INCISION AND DRAINAGE ABSCESS N/A 08/25/2017   Procedure: INCISION AND DRAINAGE perineal  ABSCESS;  Surgeon: Festus Aloe, MD;  Location: [facility];  Service: Urology;  Laterality: N/A;    Social History   Socioeconomic History  . Marital status: Single    Spouse name: Not on file  . Number of children: Not on file  . Years of education: Not on file  . Highest education level: Not on file  Occupational History  . Not on file  Tobacco Use  . Smoking status: Former Smoker    Packs/day: 2.00    Years: 30.00    Pack years: 60.00    Types: Cigarettes  . Smokeless tobacco: Never Used  Vaping Use  . Vaping Use: Never used  Substance and Sexual Activity  . Alcohol use: Yes    Alcohol/week: 1.0 - 2.0 standard drink    Types: 1 - 2 Cans of beer per week    Comment: few beers everyday  . Drug use: Never  . Sexual activity: Not on file  Other Topics Concern  . Not on file  Social History Narrative  . Not on file   Social Determinants of Health   Financial Resource Strain: Not on file   Food Insecurity: Not on file  Transportation Needs: Not on file  Physical Activity: Not on file  Stress: Not on file  Social Connections: Not on file  Intimate Partner Violence: Not on file     No Known Allergies    Review of Systems  Constitutional: Negative for chills, fever, malaise/fatigue and weight loss.  HENT: Negative for hearing loss, sore throat and tinnitus.   Eyes: Negative for blurred vision and double vision.  Respiratory: Positive for cough and shortness of breath. Negative for hemoptysis, sputum production, wheezing and stridor.   Cardiovascular: Negative for chest pain, palpitations, orthopnea, leg swelling and PND.  Gastrointestinal: Negative for abdominal pain, constipation, diarrhea, heartburn, nausea and vomiting.  Genitourinary: Negative for dysuria, hematuria and urgency.  Musculoskeletal: Negative for joint pain and myalgias.  Skin: Negative for itching and rash.  Neurological: Negative for dizziness, tingling, weakness and headaches.  Endo/Heme/Allergies: Negative for environmental allergies. Does not bruise/bleed easily.  Psychiatric/Behavioral: Negative for depression. The patient is not nervous/anxious and does not have insomnia.   All other systems reviewed and are negative.    Objective:  Physical Exam Vitals reviewed.  Constitutional:      General: He is not in acute distress.    Appearance: He is well-developed.  HENT:  Head: Normocephalic and atraumatic.  Eyes:     General: No scleral icterus.    Conjunctiva/sclera: Conjunctivae normal.     Pupils: Pupils are equal, round, and reactive to light.  Neck:     Vascular: No JVD.     Trachea: No tracheal deviation.  Cardiovascular:     Rate and Rhythm: Normal rate and regular rhythm.     Heart sounds: Normal heart sounds. No murmur heard.   Pulmonary:     Effort: Pulmonary effort is normal. No tachypnea, accessory muscle usage or respiratory distress.     Breath sounds: No stridor. No  wheezing, rhonchi or rales.  Abdominal:     General: Bowel sounds are normal. There is no distension.     Palpations: Abdomen is soft.     Tenderness: There is no abdominal tenderness.  Musculoskeletal:        General: No tenderness.     Cervical back: Neck supple.  Lymphadenopathy:     Cervical: No cervical adenopathy.  Skin:    General: Skin is warm and dry.     Capillary Refill: Capillary refill takes less than 2 seconds.     Findings: No rash.  Neurological:     Mental Status: He is alert and oriented to person, place, and time.  Psychiatric:        Behavior: Behavior normal.     Vitals:   08/30/20 0949 09/01/20 1055  BP:  (!) 185/110  Pulse:  70  Resp:  18  Temp:  97.7 F (36.5 C)  TempSrc:  Oral  SpO2:  97%  Weight: 86.2 kg 87.1 kg  Height: 5\' 9"  (1.753 m) 5\' 9"  (1.753 m)   97% on RA BMI Readings from Last 3 Encounters:  09/01/20 28.35 kg/m  08/10/20 30.11 kg/m  04/26/18 32.58 kg/m   Wt Readings from Last 3 Encounters:  09/01/20 87.1 kg  08/10/20 89.8 kg  04/26/18 94.3 kg     CBC    Component Value Date/Time   WBC 10.6 (H) 08/10/2020 2103   RBC 4.57 08/10/2020 2103   HGB 13.6 08/10/2020 2103   HCT 40.7 08/10/2020 2103   PLT 216 08/10/2020 2103   MCV 89.1 08/10/2020 2103   MCH 29.8 08/10/2020 2103   MCHC 33.4 08/10/2020 2103   RDW 12.3 08/10/2020 2103   LYMPHSABS 0.9 08/24/2017 1634   MONOABS 1.7 (H) 08/24/2017 1634   EOSABS 0.3 08/24/2017 1634   BASOSABS 0.0 08/24/2017 1634    Chest Imaging:  08/10/2020: Right upper lobe anterior lung mass. The patient's images have been independently reviewed by me.    Pulmonary Functions Testing Results: No flowsheet data found.  FeNO: na  Pathology: na  Echocardiogram: na  Heart Catheterization: na    Assessment & Plan:   Right Upper Lobe Mass Hilar mass lung mass  Concerned for an advanced staged lung cancer  Tobacco abuse   Plan:  Risk-benefit alternatives of proceeding with video  bronchoscopy with endobronchial ultrasound. Patient is agreeable to proceed. We discussed risk of bleeding and pneumothorax. We will ensure appropriate oncology follow-up once tissue diagnosis obtained.   Current Facility-Administered Medications:  .  lactated ringers infusion, , Intravenous, Continuous, Witman, Burnis Medin, MD   Garner Nash, DO [facility] 09/01/2020 11:19 AM

## 2020-09-01 NOTE — Anesthesia Procedure Notes (Signed)
Procedure Name: Intubation Date/Time: 09/01/2020 1:45 PM Performed by: Jenne Campus, CRNA Pre-anesthesia Checklist: Patient identified, Emergency Drugs available, Suction available and Patient being monitored Patient Re-evaluated:Patient Re-evaluated prior to induction Oxygen Delivery Method: Circle System Utilized Preoxygenation: Pre-oxygenation with 100% oxygen Induction Type: IV induction Ventilation: Mask ventilation without difficulty Laryngoscope Size: Miller and 3 Grade View: Grade I Tube type: Oral Tube size: 8.5 mm Number of attempts: 1 Airway Equipment and Method: Stylet and Oral airway Placement Confirmation: ETT inserted through vocal cords under direct vision,  positive ETCO2 and breath sounds checked- equal and bilateral Secured at: 21 cm Tube secured with: Tape Dental Injury: Teeth and Oropharynx as per pre-operative assessment

## 2020-09-01 NOTE — Interval H&P Note (Signed)
History and Physical Interval Note:  09/01/2020 2:33 PM  [name]  has presented today for surgery, with the diagnosis of lung mass.  The various methods of treatment have been discussed with the patient and family. After consideration of risks, benefits and other options for treatment, the patient has consented to  Procedure(s): VIDEO BRONCHOSCOPY WITH ENDOBRONCHIAL ULTRASOUND (N/A) BRONCHIAL NEEDLE ASPIRATION BIOPSIES BRONCHIAL WASHINGS BRONCHIAL BRUSHINGS BRONCHIAL BIOPSIES as a surgical intervention.  The patient's history has been reviewed, patient examined, no change in status, stable for surgery.  I have reviewed the patient's chart and labs.  Questions were answered to the patient's satisfaction.     Lu Verne

## 2020-09-01 NOTE — Anesthesia Preprocedure Evaluation (Addendum)
Anesthesia Evaluation  Patient identified by MRN, date of birth, ID band Patient awake    Reviewed: Allergy & Precautions, NPO status , Patient's Chart, lab work & pertinent test results  History of Anesthesia Complications Negative for: history of anesthetic complications  Airway Mallampati: II  TM Distance: >3 FB Neck ROM: Full    Dental  (+) Edentulous Upper, Dental Advisory Given   Pulmonary former smoker,  Lung mass   Pulmonary exam normal        Cardiovascular negative cardio ROS Normal cardiovascular exam     Neuro/Psych negative neurological ROS     GI/Hepatic negative GI ROS, Neg liver ROS,   Endo/Other  Hypothyroidism   Renal/GU negative Renal ROS  negative genitourinary   Musculoskeletal negative musculoskeletal ROS (+)   Abdominal   Peds  Hematology negative hematology ROS (+)   Anesthesia Other Findings   Reproductive/Obstetrics                          Anesthesia Physical Anesthesia Plan  ASA: II  Anesthesia Plan: General   Post-op Pain Management:    Induction: Intravenous  PONV Risk Score and Plan: 2 and Ondansetron, Dexamethasone, Treatment may vary due to age or medical condition and Midazolam  Airway Management Planned: Oral ETT  Additional Equipment: None  Intra-op Plan:   Post-operative Plan: Extubation in OR  Informed Consent: I have reviewed the patients History and Physical, chart, labs and discussed the procedure including the risks, benefits and alternatives for the proposed anesthesia with the patient or authorized representative who has indicated his/her understanding and acceptance.     Dental advisory given  Plan Discussed with:   Anesthesia Plan Comments:         Anesthesia Quick Evaluation

## 2020-09-01 NOTE — H&P (View-Only) (Signed)
Synopsis: Referred in April 2022 for lung mass by Dr. Earlie Server  Subjective:   PATIENT ID: [name] GENDER: male DOB: 1960/12/19, MRN: [id number]  Chief complaint: Abnormal CT imaging lung mass  This is a 60 year old gentleman past medical history of tobacco abuse, hypothyroidism patient initially seen in the emergency department on 08/10/2020 pulmonary was consulted for recommendations and management.  Patient was initially referred to thoracic surgery.  Called and spoke with patient regarding CT imaging and recommendations for tissue biopsy.  Patient had CT scan on 08/10/2020 which revealed a 6 x 2 cm right hilar mass concerning for malignancy.     Past Medical History:  Diagnosis Date  . Hypothyroidism   . Thyroid disease      Family History  Problem Relation Age of Onset  . Cancer Mother      Past Surgical History:  Procedure Laterality Date  . APPENDECTOMY    . HERNIA REPAIR    . INCISION AND DRAINAGE ABSCESS N/A 08/25/2017   Procedure: INCISION AND DRAINAGE perineal  ABSCESS;  Surgeon: Festus Aloe, MD;  Location: [facility];  Service: Urology;  Laterality: N/A;    Social History   Socioeconomic History  . Marital status: Single    Spouse name: Not on file  . Number of children: Not on file  . Years of education: Not on file  . Highest education level: Not on file  Occupational History  . Not on file  Tobacco Use  . Smoking status: Former Smoker    Packs/day: 2.00    Years: 30.00    Pack years: 60.00    Types: Cigarettes  . Smokeless tobacco: Never Used  Vaping Use  . Vaping Use: Never used  Substance and Sexual Activity  . Alcohol use: Yes    Alcohol/week: 1.0 - 2.0 standard drink    Types: 1 - 2 Cans of beer per week    Comment: few beers everyday  . Drug use: Never  . Sexual activity: Not on file  Other Topics Concern  . Not on file  Social History Narrative  . Not on file   Social Determinants of Health   Financial Resource Strain: Not on file   Food Insecurity: Not on file  Transportation Needs: Not on file  Physical Activity: Not on file  Stress: Not on file  Social Connections: Not on file  Intimate Partner Violence: Not on file     No Known Allergies    Review of Systems  Constitutional: Negative for chills, fever, malaise/fatigue and weight loss.  HENT: Negative for hearing loss, sore throat and tinnitus.   Eyes: Negative for blurred vision and double vision.  Respiratory: Positive for cough and shortness of breath. Negative for hemoptysis, sputum production, wheezing and stridor.   Cardiovascular: Negative for chest pain, palpitations, orthopnea, leg swelling and PND.  Gastrointestinal: Negative for abdominal pain, constipation, diarrhea, heartburn, nausea and vomiting.  Genitourinary: Negative for dysuria, hematuria and urgency.  Musculoskeletal: Negative for joint pain and myalgias.  Skin: Negative for itching and rash.  Neurological: Negative for dizziness, tingling, weakness and headaches.  Endo/Heme/Allergies: Negative for environmental allergies. Does not bruise/bleed easily.  Psychiatric/Behavioral: Negative for depression. The patient is not nervous/anxious and does not have insomnia.   All other systems reviewed and are negative.    Objective:  Physical Exam Vitals reviewed.  Constitutional:      General: He is not in acute distress.    Appearance: He is well-developed.  HENT:  Head: Normocephalic and atraumatic.  Eyes:     General: No scleral icterus.    Conjunctiva/sclera: Conjunctivae normal.     Pupils: Pupils are equal, round, and reactive to light.  Neck:     Vascular: No JVD.     Trachea: No tracheal deviation.  Cardiovascular:     Rate and Rhythm: Normal rate and regular rhythm.     Heart sounds: Normal heart sounds. No murmur heard.   Pulmonary:     Effort: Pulmonary effort is normal. No tachypnea, accessory muscle usage or respiratory distress.     Breath sounds: No stridor. No  wheezing, rhonchi or rales.  Abdominal:     General: Bowel sounds are normal. There is no distension.     Palpations: Abdomen is soft.     Tenderness: There is no abdominal tenderness.  Musculoskeletal:        General: No tenderness.     Cervical back: Neck supple.  Lymphadenopathy:     Cervical: No cervical adenopathy.  Skin:    General: Skin is warm and dry.     Capillary Refill: Capillary refill takes less than 2 seconds.     Findings: No rash.  Neurological:     Mental Status: He is alert and oriented to person, place, and time.  Psychiatric:        Behavior: Behavior normal.     Vitals:   08/30/20 0949 09/01/20 1055  BP:  (!) 185/110  Pulse:  70  Resp:  18  Temp:  97.7 F (36.5 C)  TempSrc:  Oral  SpO2:  97%  Weight: 86.2 kg 87.1 kg  Height: 5\' 9"  (1.753 m) 5\' 9"  (1.753 m)   97% on RA BMI Readings from Last 3 Encounters:  09/01/20 28.35 kg/m  08/10/20 30.11 kg/m  04/26/18 32.58 kg/m   Wt Readings from Last 3 Encounters:  09/01/20 87.1 kg  08/10/20 89.8 kg  04/26/18 94.3 kg     CBC    Component Value Date/Time   WBC 10.6 (H) 08/10/2020 2103   RBC 4.57 08/10/2020 2103   HGB 13.6 08/10/2020 2103   HCT 40.7 08/10/2020 2103   PLT 216 08/10/2020 2103   MCV 89.1 08/10/2020 2103   MCH 29.8 08/10/2020 2103   MCHC 33.4 08/10/2020 2103   RDW 12.3 08/10/2020 2103   LYMPHSABS 0.9 08/24/2017 1634   MONOABS 1.7 (H) 08/24/2017 1634   EOSABS 0.3 08/24/2017 1634   BASOSABS 0.0 08/24/2017 1634    Chest Imaging:  08/10/2020: Right upper lobe anterior lung mass. The patient's images have been independently reviewed by me.    Pulmonary Functions Testing Results: No flowsheet data found.  FeNO: na  Pathology: na  Echocardiogram: na  Heart Catheterization: na    Assessment & Plan:   Right Upper Lobe Mass Hilar mass lung mass  Concerned for an advanced staged lung cancer  Tobacco abuse   Plan:  Risk-benefit alternatives of proceeding with video  bronchoscopy with endobronchial ultrasound. Patient is agreeable to proceed. We discussed risk of bleeding and pneumothorax. We will ensure appropriate oncology follow-up once tissue diagnosis obtained.   Current Facility-Administered Medications:  .  lactated ringers infusion, , Intravenous, Continuous, Witman, Burnis Medin, MD   Garner Nash, DO [facility] 09/01/2020 11:19 AM

## 2020-09-01 NOTE — Discharge Instructions (Signed)
Flexible Bronchoscopy, Care After This sheet gives you information about how to care for yourself after your test. Your doctor may also give you more specific instructions. If you have problems or questions, contact your doctor. Follow these instructions at home: Eating and drinking  Do not eat or drink anything (not even water) for 2 hours after your test, or until your numbing medicine (local anesthetic) wears off.  When your numbness is gone and your cough and gag reflexes have come back, you may: ? Eat only soft foods. ? Slowly drink liquids.  The day after the test, go back to your normal diet. Driving  Do not drive for 24 hours if you were given a medicine to help you relax (sedative).  Do not drive or use heavy machinery while taking prescription pain medicine. General instructions   Take over-the-counter and prescription medicines only as told by your doctor.  Return to your normal activities as told. Ask what activities are safe for you.  Do not use any products that have nicotine or tobacco in them. This includes cigarettes and e-cigarettes. If you need help quitting, ask your doctor.  Keep all follow-up visits as told by your doctor. This is important. It is very important if you had a tissue sample (biopsy) taken. Get help right away if:  You have shortness of breath that gets worse.  You get light-headed.  You feel like you are going to pass out (faint).  You have chest pain.  You cough up: ? More than a little blood. ? More blood than before. Summary  Do not eat or drink anything (not even water) for 2 hours after your test, or until your numbing medicine wears off.  Do not use cigarettes. Do not use e-cigarettes.  Get help right away if you have chest pain.   This information is not intended to replace advice given to you by your health care provider. Make sure you discuss any questions you have with your health care provider. Document Released:  02/20/2009 Document Revised: 04/07/2017 Document Reviewed: 05/13/2016 Elsevier Patient Education  2020 [employer].

## 2020-09-01 NOTE — Anesthesia Postprocedure Evaluation (Signed)
Anesthesia Post Note  Patient: [name]  Procedure(s) Performed: VIDEO BRONCHOSCOPY WITH ENDOBRONCHIAL ULTRASOUND (N/A ) BRONCHIAL NEEDLE ASPIRATION BIOPSIES BRONCHIAL WASHINGS BRONCHIAL BRUSHINGS BRONCHIAL BIOPSIES     Patient location during evaluation: PACU Anesthesia Type: General Level of consciousness: awake and alert Pain management: pain level controlled Vital Signs Assessment: post-procedure vital signs reviewed and stable Respiratory status: spontaneous breathing, nonlabored ventilation and respiratory function stable Cardiovascular status: blood pressure returned to baseline and stable Postop Assessment: no apparent nausea or vomiting Anesthetic complications: no   No complications documented.  Last Vitals:  Vitals:   09/01/20 1533 09/01/20 1544  BP: (!) 143/87 (!) 137/96  Pulse: 71 70  Resp: 19 17  Temp:  36.7 C  SpO2: 94% 95%    Last Pain:  Vitals:   09/01/20 1544  TempSrc:   PainSc: 0-No pain                 Lidia Collum

## 2020-09-01 NOTE — Transfer of Care (Signed)
Immediate Anesthesia Transfer of Care Note  Patient: [name]  Procedure(s) Performed: VIDEO BRONCHOSCOPY WITH ENDOBRONCHIAL ULTRASOUND (N/A ) BRONCHIAL NEEDLE ASPIRATION BIOPSIES BRONCHIAL WASHINGS BRONCHIAL BRUSHINGS BRONCHIAL BIOPSIES  Patient Location: PACU  Anesthesia Type:General  Level of Consciousness: awake, oriented and patient cooperative  Airway & Oxygen Therapy: Patient Spontanous Breathing and Patient connected to nasal cannula oxygen  Post-op Assessment: Report given to RN and Post -op Vital signs reviewed and stable  Post vital signs: Reviewed  Last Vitals:  Vitals Value Taken Time  BP 138/85 09/01/20 1434  Temp    Pulse 84 09/01/20 1434  Resp 20 09/01/20 1434  SpO2 93 % 09/01/20 1434  Vitals shown include unvalidated device data.  Last Pain:  Vitals:   09/01/20 1102  TempSrc:   PainSc: 0-No pain      Patients Stated Pain Goal: 6 (76/22/63 [id number])  Complications: No complications documented.

## 2020-09-02 ENCOUNTER — Encounter (HOSPITAL_COMMUNITY): Payer: Self-pay | Admitting: Pulmonary Disease

## 2020-09-02 ENCOUNTER — Telehealth: Payer: Self-pay | Admitting: Internal Medicine

## 2020-09-02 ENCOUNTER — Encounter: Payer: Self-pay | Admitting: *Deleted

## 2020-09-02 ENCOUNTER — Ambulatory Visit (HOSPITAL_COMMUNITY)
Admission: RE | Admit: 2020-09-02 | Discharge: 2020-09-02 | Disposition: A | Discharge status: Home / Self Care | Payer: Managed Care, Other (non HMO) | Source: Ambulatory Visit | Attending: Pulmonary Disease | Admitting: Pulmonary Disease

## 2020-09-02 DIAGNOSIS — R918 Other nonspecific abnormal finding of lung field: Secondary | ICD-10-CM | POA: Diagnosis present

## 2020-09-02 LAB — CYTOLOGY - NON PAP

## 2020-09-02 LAB — GLUCOSE, CAPILLARY: Glucose-Capillary: 100 mg/dL — ABNORMAL HIGH (ref 70–99)

## 2020-09-02 MED ORDER — FLUDEOXYGLUCOSE F - 18 (FDG) INJECTION
9.5000 | Freq: Once | INTRAVENOUS | Status: AC
  Administered 2020-09-02: 9.85 via INTRAVENOUS

## 2020-09-02 NOTE — Progress Notes (Signed)
I received referral on Paul Mclean yesterday while the patient was in the hospital.  Today, new patient coordinator got a call and he would like an appt. I update her to call him for an appt with Dr. Julien Nordmann on 5/3 with labs.

## 2020-09-02 NOTE — Telephone Encounter (Signed)
Received a new pt referral from Dr. Valeta Harms for lung mass. Paul Mclean has been scheduled to see Dr. Julien Nordmann on 5/3 at 2:15pm w/labs at 1:45pm. I cld and lft the appt date and time on the pt's vm.

## 2020-09-08 ENCOUNTER — Inpatient Hospital Stay (HOSPITAL_BASED_OUTPATIENT_CLINIC_OR_DEPARTMENT_OTHER): Payer: Managed Care, Other (non HMO) | Admitting: Internal Medicine

## 2020-09-08 ENCOUNTER — Other Ambulatory Visit: Payer: Self-pay

## 2020-09-08 ENCOUNTER — Inpatient Hospital Stay: Payer: Managed Care, Other (non HMO)

## 2020-09-08 DIAGNOSIS — Z510 Encounter for antineoplastic radiation therapy: Secondary | ICD-10-CM | POA: Diagnosis present

## 2020-09-08 DIAGNOSIS — Z87891 Personal history of nicotine dependence: Secondary | ICD-10-CM | POA: Insufficient documentation

## 2020-09-08 DIAGNOSIS — C3411 Malignant neoplasm of upper lobe, right bronchus or lung: Secondary | ICD-10-CM | POA: Insufficient documentation

## 2020-09-08 DIAGNOSIS — Z5111 Encounter for antineoplastic chemotherapy: Secondary | ICD-10-CM | POA: Insufficient documentation

## 2020-09-08 DIAGNOSIS — C3490 Malignant neoplasm of unspecified part of unspecified bronchus or lung: Secondary | ICD-10-CM | POA: Diagnosis not present

## 2020-09-08 DIAGNOSIS — R918 Other nonspecific abnormal finding of lung field: Secondary | ICD-10-CM

## 2020-09-08 DIAGNOSIS — E039 Hypothyroidism, unspecified: Secondary | ICD-10-CM | POA: Insufficient documentation

## 2020-09-08 DIAGNOSIS — C3491 Malignant neoplasm of unspecified part of right bronchus or lung: Secondary | ICD-10-CM

## 2020-09-08 LAB — CBC WITH DIFFERENTIAL (CANCER CENTER ONLY)
Abs Immature Granulocytes: 0.03 10*3/uL (ref 0.00–0.07)
Basophils Absolute: 0 10*3/uL (ref 0.0–0.1)
Basophils Relative: 1 %
Eosinophils Absolute: 0.2 10*3/uL (ref 0.0–0.5)
Eosinophils Relative: 3 %
HCT: 42.7 % (ref 39.0–52.0)
Hemoglobin: 14.3 g/dL (ref 13.0–17.0)
Immature Granulocytes: 0 %
Lymphocytes Relative: 22 %
Lymphs Abs: 1.6 10*3/uL (ref 0.7–4.0)
MCH: 29.1 pg (ref 26.0–34.0)
MCHC: 33.5 g/dL (ref 30.0–36.0)
MCV: 87 fL (ref 80.0–100.0)
Monocytes Absolute: 0.7 10*3/uL (ref 0.1–1.0)
Monocytes Relative: 10 %
Neutro Abs: 4.6 10*3/uL (ref 1.7–7.7)
Neutrophils Relative %: 64 %
Platelet Count: 234 10*3/uL (ref 150–400)
RBC: 4.91 MIL/uL (ref 4.22–5.81)
RDW: 12.4 % (ref 11.5–15.5)
WBC Count: 7.2 10*3/uL (ref 4.0–10.5)
nRBC: 0 % (ref 0.0–0.2)

## 2020-09-08 LAB — BASIC METABOLIC PANEL - CANCER CENTER ONLY
Anion gap: 11 (ref 5–15)
BUN: 10 mg/dL (ref 6–20)
CO2: 26 mmol/L (ref 22–32)
Calcium: 9.4 mg/dL (ref 8.9–10.3)
Chloride: 103 mmol/L (ref 98–111)
Creatinine: 0.83 mg/dL (ref 0.61–1.24)
GFR, Estimated: >60 mL/min (ref >60)
Glucose, Bld: 102 mg/dL — ABNORMAL HIGH (ref 70–99)
Potassium: 4.4 mmol/L (ref 3.5–5.1)
Sodium: 140 mmol/L (ref 135–145)

## 2020-09-08 MED ORDER — PROCHLORPERAZINE MALEATE 10 MG PO TABS: 10.0000 mg | ORAL_TABLET | Freq: Four times a day (QID) | ORAL | 0 refills | Status: DC | PRN

## 2020-09-08 NOTE — Progress Notes (Signed)

## 2020-09-08 NOTE — Progress Notes (Signed)
[facility] [phone]   [phone]  CONSULT NOTE  REFERRING PHYSICIAN: Dr. Leory Plowman Icard  REASON FOR CONSULTATION:  61 years old white male recently diagnosed with lung cancer.  HPI Paul Mclean is a 60 y.o. male with past medical history significant for hypothyroidism and history of smoking but quit 10 years ago.  The patient mentioned that in early April 2022 he was complaining of chest pain with deep breaths.  He presented to the emergency department and had CT angiogram of the chest performed on 08/10/2020 and that showed no central, segmental or subsegmental pulmonary embolism but there was anterior right upper lobe mass extending into the hilum and measuring 6.2 x 2.7 cm concerning for pneumonia but underlying pulmonary neoplasm could not be excluded.  The patient was referred to Dr. Valeta Harms and on September 01, 2020 he underwent video bronchoscopy with endobronchial ultrasound procedure and biopsies.  The final pathology ([id number]) from the fine-needle aspiration of the 10 R lymph node as well as the brushing of the right upper lobe showed malignant cells consistent with non-small cell carcinoma, the morphology is consistent with squamous cell carcinoma. The patient had a PET scan on 09/02/2020 and it showed the right upper lobe perihilar lung mass was hypermetabolic with SUV max of 79.0.  There is also a small lower right paratracheal lymph node measuring 0.56 cm with SUV max of 3.28 and 0.8 cm subcarinal lymph node with SUV max of 3.88.  The scan also showed progressive postobstructive pneumonitis within the anterior and anteromedial right upper lobe and within this area there is a new nodular density measuring 1.2 cm with SUV max of 3.74.  Dr. Valeta Harms kindly referred the patient to me today for evaluation and recommendation regarding treatment of his condition. When seen today he continues to complain of the chest congestion with productive cough and mild  chest pain but no significant shortness of breath or hemoptysis.  He denied having any nausea, vomiting, diarrhea or constipation.  He denied having any fever or chills.  He has no headache or visual changes. Family history significant for mother with breast cancer and father died from old age at age 87. The patient is single and has 3 children.  He was accompanied today by his fiance Paul Mclean.  He works as a Futures trader.  He has a history of smoking up to 3 packs/day for around 30 years but quit 10 years ago.  He also drinks around 6 beers every day and no history of drug abuse.   HPI  Past Medical History:  Diagnosis Date  . Hypothyroidism   . Thyroid disease     Past Surgical History:  Procedure Laterality Date  . APPENDECTOMY    . BRONCHIAL BIOPSY  09/01/2020   Procedure: BRONCHIAL BIOPSIES;  Surgeon: Garner Nash, DO;  Location: [facility];  Service: Pulmonary;;  . BRONCHIAL BRUSHINGS  09/01/2020   Procedure: BRONCHIAL BRUSHINGS;  Surgeon: Garner Nash, DO;  Location: [facility];  Service: Pulmonary;;  . BRONCHIAL NEEDLE ASPIRATION BIOPSY  09/01/2020   Procedure: BRONCHIAL NEEDLE ASPIRATION BIOPSIES;  Surgeon: Garner Nash, DO;  Location: [facility];  Service: Pulmonary;;  . BRONCHIAL WASHINGS  09/01/2020   Procedure: BRONCHIAL WASHINGS;  Surgeon: Garner Nash, DO;  Location: [facility];  Service: Pulmonary;;  . HERNIA REPAIR    . INCISION AND DRAINAGE ABSCESS N/A 08/25/2017   Procedure: INCISION AND DRAINAGE perineal  ABSCESS;  Surgeon: Festus Aloe, MD;  Location: [facility];  Service: Urology;  Laterality: N/A;  . VIDEO BRONCHOSCOPY WITH ENDOBRONCHIAL ULTRASOUND N/A 09/01/2020   Procedure: VIDEO BRONCHOSCOPY WITH ENDOBRONCHIAL ULTRASOUND;  Surgeon: Garner Nash, DO;  Location: [facility];  Service: Pulmonary;  Laterality: N/A;    Family History  Problem Relation Age of Onset  . Cancer Mother     Social History Social History   Tobacco  Use  . Smoking status: Former Smoker    Packs/day: 2.00    Years: 30.00    Pack years: 60.00    Types: Cigarettes  . Smokeless tobacco: Never Used  Vaping Use  . Vaping Use: Never used  Substance Use Topics  . Alcohol use: Yes    Alcohol/week: 1.0 - 2.0 standard drink    Types: 1 - 2 Cans of beer per week    Comment: few beers everyday  . Drug use: Never    No Known Allergies  Current Outpatient Medications  Medication Sig Dispense Refill  . levothyroxine (SYNTHROID) 112 MCG tablet Take 112 mcg by mouth in the morning.     No current facility-administered medications for this visit.    Review of Systems  Constitutional: positive for fatigue Eyes: negative Ears, nose, mouth, throat, and face: negative Respiratory: positive for cough, pleurisy/chest pain and sputum Cardiovascular: negative Gastrointestinal: negative Genitourinary:negative Integument/breast: negative Hematologic/lymphatic: negative Musculoskeletal:negative Neurological: negative Behavioral/Psych: negative Endocrine: negative Allergic/Immunologic: negative  Physical Exam  YJE:HUDJS, healthy, no distress, well nourished and well developed SKIN: skin color, texture, turgor are normal, no rashes or significant lesions HEAD: Normocephalic, No masses, lesions, tenderness or abnormalities EYES: normal, PERRLA, Conjunctiva are pink and non-injected EARS: External ears normal, Canals clear OROPHARYNX:no exudate, no erythema and lips, buccal mucosa, and tongue normal  NECK: supple, no adenopathy, no JVD LYMPH:  no palpable lymphadenopathy, no hepatosplenomegaly LUNGS: clear to auscultation , and palpation HEART: regular rate & rhythm, no murmurs and no gallops ABDOMEN:abdomen soft, non-tender, normal bowel sounds and no masses or organomegaly BACK: No CVA tenderness, Range of motion is normal EXTREMITIES:no joint deformities, effusion, or inflammation, no edema  NEURO: alert & oriented x 3 with fluent  speech, no focal motor/sensory deficits  PERFORMANCE STATUS: ECOG 1  LABORATORY DATA: Lab Results  Component Value Date   WBC 10.6 (H) 08/10/2020   HGB 13.6 08/10/2020   HCT 40.7 08/10/2020   MCV 89.1 08/10/2020   PLT 216 08/10/2020      Chemistry      Component Value Date/Time   NA 133 (L) 08/10/2020 2103   K 3.4 (L) 08/10/2020 2103   CL 100 08/10/2020 2103   CO2 23 08/10/2020 2103   BUN 14 08/10/2020 2103   CREATININE 0.86 08/10/2020 2103      Component Value Date/Time   CALCIUM 8.3 (L) 08/10/2020 2103   ALKPHOS 88 08/24/2017 1634   AST 25 08/24/2017 1634   ALT 43 08/24/2017 1634   BILITOT 0.6 08/24/2017 1634       RADIOGRAPHIC STUDIES: CT Angio Chest PE W and/or Wo Contrast  Result Date: 08/10/2020 CLINICAL DATA:  Chest pain EXAM: CT ANGIOGRAPHY CHEST WITH CONTRAST TECHNIQUE: Multidetector CT imaging of the chest was performed using the standard protocol during bolus administration of intravenous contrast. Multiplanar CT image reconstructions and MIPs were obtained to evaluate the vascular anatomy. CONTRAST:  167mL OMNIPAQUE IOHEXOL 350 MG/ML SOLN COMPARISON:  None. FINDINGS: Cardiovascular: There is a optimal opacification of the pulmonary arteries. There is no central,segmental, or subsegmental filling defects within the pulmonary  arteries. There is mild cardiomegaly. No pericardial effusion or thickening. No evidence right heart strain. There is normal three-vessel brachiocephalic anatomy without proximal stenosis. The thoracic aorta is normal in appearance. Mediastinum/Nodes: Ill-defined mass seen within the right hila at the right upper lobe anteriorly measuring approximately 6.2 x 2.7 cm. The mass obstructs the anterior right upper lobe bronchials Lungs/Pleura: As described above anterior right hilar/upper lobe numbness is seen. There is thickening along the right minor fissure. A small right pleural effusion is seen. There appears to be adjacent reticulonodular opacities  and interlobular septal thickening within the right upper lobe. Upper Abdomen: No acute abnormalities present in the visualized portions of the upper abdomen. Musculoskeletal: No chest wall abnormality. No acute or significant osseous findings. Review of the MIP images confirms the above findings. IMPRESSION: 1. No central, segmental, or subsegmental pulmonary embolism. 2. Anterior right upper lobe mass extending into the hilum measuring 6.2 x 2.7 cm concerning for a pneumonia, and cannot exclude underlying pulmonary neoplasm. 3. Adjacent reticulonodular opacities which could be due to postobstructive infectious or inflammatory process 4. Small right pleural effusion Electronically Signed   By: Prudencio Pair M.D.   On: 08/10/2020 23:44   NM PET Image Initial (PI) Skull Base To Thigh  Result Date: 09/03/2020 CLINICAL DATA:  Initial treatment strategy for non-small cell lung cancer. EXAM: NUCLEAR MEDICINE PET SKULL BASE TO THIGH TECHNIQUE: 9.85 mCi F-18 FDG was injected intravenously. Full-ring PET imaging was performed from the skull base to thigh after the radiotracer. CT data was obtained and used for attenuation correction and anatomic localization. Fasting blood glucose: 100 mg/dl COMPARISON:  CT angio chest 08/10/2020 FINDINGS: Mediastinal blood pool activity: SUV max 2.38 Liver activity: SUV max NA NECK: Asymmetric increased uptake localizing to the inferior pole of left lobe of thyroid gland may reflect FDG avid thyroid nodule. The SUV max is equal to 6.37. No FDG avid lymph nodes identified within the soft tissues of the neck. Incidental CT findings: none CHEST: Right upper lobe perihilar lung mass is FDG avid measuring 5.47 with SUV max of 14.4. Signs of postobstructive pneumonitis is noted with increased ground-glass attenuation in the anterior and anteromedial right upper lobe. Within this area there is a 1.2 cm nodule with SUV max of 3.74, image 26/8. 5.6 mm low right paratracheal lymph node has an SUV  max of 3.28, image 67/4. 8 mm subcarinal lymph node has an SUV max of 3.88, image 72/4. Incidental CT findings: Centrilobular emphysema. Aortic atherosclerosis. No pericardial effusion. ABDOMEN/PELVIS: No abnormal hypermetabolic activity within the liver, pancreas, adrenal glands, or spleen. No hypermetabolic lymph nodes in the abdomen or pelvis. Incidental CT findings: Aortic atherosclerosis. No aneurysm. Small fat containing umbilical hernia. SKELETON: No focal hypermetabolic activity to suggest skeletal metastasis. Incidental CT findings: none IMPRESSION: 1. Intense FDG uptake is associated with the right upper lobe perihilar lung mass compatible with primary bronchogenic carcinoma. 2. Progressive postobstructive pneumonitis within the anterior and anteromedial right upper lobe. Within this area there is a new nodular density measuring 1.2 cm within SUV max of 3.74. 3. Small, less than 1 cm, low right paratracheal and subcarinal lymph nodes exhibit mild FDG uptake just above blood pool activity. 4. Asymmetric FDG uptake localizes to the inferior pole of left lobe of thyroid gland. Cannot exclude FDG avid thyroid nodule. Recommend thyroid US (ref: J Am Coll Radiol. 2015 Feb;12(2): 143-50). 5. Aortic Atherosclerosis (ICD10-I70.0) and Emphysema (ICD10-J43.9). Electronically Signed   By: Kerby Moors M.D.   On:  09/03/2020 08:51   DG CHEST PORT 1 VIEW  Result Date: 09/01/2020 CLINICAL DATA:  Patient status post bronchoscopy.  Right hilar mass. EXAM: PORTABLE CHEST 1 VIEW COMPARISON:  CT chest 08/10/2020 FINDINGS: There is no pneumothorax after bronchoscopy. Right hilar mass lesion is again seen. The left lung is clear. No pleural fluid. Heart size is normal. IMPRESSION: Negative for pneumothorax after bronchoscopy. Right hilar mass as seen on prior exam. Electronically Signed   By: Inge Rise M.D.   On: 09/01/2020 15:32   DG Chest Port 1 View  Result Date: 08/10/2020 CLINICAL DATA:  Shortness of breath  and chest pain EXAM: PORTABLE CHEST 1 VIEW COMPARISON:  None. FINDINGS: The heart size and mediastinal contours are within normal limits. Prominence of the central pulmonary vasculature is seen. The visualized skeletal structures are unremarkable. IMPRESSION: Mild pulmonary vascular congestion Electronically Signed   By: Prudencio Pair M.D.   On: 08/10/2020 20:07    ASSESSMENT: This is a very pleasant 60 years old white male recently diagnosed with stage IIIb (T3, N2, M0) non-small cell lung cancer, squamous cell carcinoma presented with large right upper lobe perihilar mass with right hilar and subcarinal lymphadenopathy diagnosed in April 2022.   PLAN: I had a lengthy discussion with the patient and his fiance today about his current disease stage, prognosis and treatment options.  I personally and independently reviewed the PET scan images and discussed the result and showed the images to the patient and his fiance today. I recommended for the patient to complete the staging work-up by ordering MRI of the brain to rule out brain metastasis. I discussed with the patient his treatment options and recommended for him a course of concurrent chemoradiation with weekly carboplatin for AUC of 2 and paclitaxel 45 Mg/M2 for 6-7 weeks.  This will be followed by consolidation treatment with immunotherapy with Imfinzi if the patient has no evidence for disease progression after the induction phase. I discussed with the patient the adverse effect of this treatment including but not limited to alopecia, myelosuppression, nausea and vomiting, peripheral neuropathy, liver or renal dysfunction. He is expected to start the first cycle of this treatment on Sep 21, 2020. I will refer the patient to radiation oncology for evaluation and discussion of the radiotherapy option. I will also request his tissue block to be sent for PD-L1 expression. The patient will come back for follow-up visit with the start of the first  cycle of his treatment. He will have a chemotherapy education class before starting his treatment. I will call his pharmacy with prescription for Compazine 10 mg p.o. every 6 hours as needed for nausea. For the alcohol drinking, I strongly encouraged the patient to quit or decrease the amount of alcohol he drinks on regular basis The patient was advised to call immediately if he has any concerning symptoms in the interval.  The patient voices understanding of current disease status and treatment options and is in agreement with the current care plan.  All questions were answered. The patient knows to call the clinic with any problems, questions or concerns. We can certainly see the patient much sooner if necessary.  Thank you so much for allowing me to participate in the care of [employer] I will continue to follow up the patient with you and assist in his care.  The total time spent in the appointment was 90 minutes.  Disclaimer: This note was dictated with voice recognition software. Similar sounding words can inadvertently be  transcribed and may not be corrected upon review.   Eilleen Kempf Sep 08, 2020, 1:36 PM

## 2020-09-11 ENCOUNTER — Telehealth: Payer: Self-pay | Admitting: Internal Medicine

## 2020-09-11 NOTE — Telephone Encounter (Signed)
Scheduled per los, patient has been called and notified of upcoming appointments.

## 2020-09-15 ENCOUNTER — Institutional Professional Consult (permissible substitution): Payer: Managed Care, Other (non HMO) | Admitting: Pulmonary Disease

## 2020-09-15 NOTE — Progress Notes (Signed)
Pharmacist Chemotherapy Monitoring - Initial Assessment    Anticipated start date: 09/22/20  Regimen:  . Are orders appropriate based on the patient's diagnosis, regimen, and cycle? Yes . Does the plan date match the patient's scheduled date? Yes . Is the sequencing of drugs appropriate? Yes . Are the premedications appropriate for the patient's regimen? Yes . Prior Authorization for treatment is: Pending o If applicable, is the correct biosimilar selected based on the patient's insurance? not applicable  Organ Function and Labs: Marland Kitchen Are dose adjustments needed based on the patient's renal function, hepatic function, or hematologic function? No . Are appropriate labs ordered prior to the start of patient's treatment? Yes . Other organ system assessment, if indicated: N/A . The following baseline labs, if indicated, have been ordered: N/A  Dose Assessment: . Are the drug doses appropriate? Yes . Are the following correct: o Drug concentrations Yes o IV fluid compatible with drug Yes o Administration routes Yes o Timing of therapy Yes . If applicable, does the patient have documented access for treatment and/or plans for port-a-cath placement? no . If applicable, have lifetime cumulative doses been properly documented and assessed? yes Lifetime Dose Tracking  No doses have been documented on this patient for the following tracked chemicals: Doxorubicin, Epirubicin, Idarubicin, Daunorubicin, Mitoxantrone, Bleomycin, Oxaliplatin, Carboplatin, Liposomal Doxorubicin  o   Toxicity Monitoring/Prevention: . The patient has the following take home antiemetics prescribed: Prochlorperazine . The patient has the following take home medications prescribed: N/A . Medication allergies and previous infusion related reactions, if applicable, have been reviewed and addressed. Yes . The patient's current medication list has been assessed for drug-drug interactions with their chemotherapy regimen. no  significant drug-drug interactions were identified on review.  Order Review: . Are the treatment plan orders signed? No . Is the patient scheduled to see a provider prior to their treatment? No  I verify that I have reviewed each item in the above checklist and answered each question accordingly.   Kennith Center, Pharm.D., CPP 09/15/2020@11 :52 AM

## 2020-09-15 NOTE — Progress Notes (Signed)
Location of tumor and Histology per Pathology Report: [facility] lung 09/01/2020   Past/Anticipated interventions by surgeon, if any:  Video Bronchoscopy with Endobronchial Ultrasound Procedure Note Video bronchoscopy with endobronchial biopsies and brushing procedure note  Date of Operation: 09/01/2020 Surgeon: Garner Nash, DO   Past/Anticipated interventions by medical oncology, if any: Dr Julien Nordmann    Pain issues, if any:  no   SAFETY ISSUES:  Prior radiation? no  Pacemaker/ICD? no  Possible current pregnancy?no  Is the patient on methotrexate? no  Current Complaints / other details: mild nausea, began chemotherapy 09/22/2020     Vitals:   09/23/20 1441  BP: (!) 134/92  Pulse: 67  Resp: 20  Temp: 97.9 F (36.6 C)  SpO2: 97%  Weight: 198 lb 9.6 oz (90.1 kg)  Height: 5' 8.5" (1.74 m)

## 2020-09-16 ENCOUNTER — Other Ambulatory Visit: Payer: Self-pay

## 2020-09-16 ENCOUNTER — Inpatient Hospital Stay: Payer: Managed Care, Other (non HMO)

## 2020-09-16 ENCOUNTER — Ambulatory Visit (HOSPITAL_COMMUNITY)
Admission: RE | Admit: 2020-09-16 | Discharge: 2020-09-16 | Disposition: A | Discharge status: Home / Self Care | Payer: Managed Care, Other (non HMO) | Source: Ambulatory Visit | Attending: Internal Medicine | Admitting: Internal Medicine

## 2020-09-16 DIAGNOSIS — C3490 Malignant neoplasm of unspecified part of unspecified bronchus or lung: Secondary | ICD-10-CM | POA: Insufficient documentation

## 2020-09-16 MED ORDER — GADOBUTROL 1 MMOL/ML IV SOLN
9.0000 mL | Freq: Once | INTRAVENOUS | Status: AC | PRN
  Administered 2020-09-16: 9 mL via INTRAVENOUS

## 2020-09-16 NOTE — Progress Notes (Signed)
Pt became nauseated post contrast. This resolved with in about 3 minutes - no other complaints ; no complaints at end of exam

## 2020-09-17 ENCOUNTER — Ambulatory Visit (HOSPITAL_COMMUNITY): Admission: RE | Admit: 2020-09-17 | Payer: Managed Care, Other (non HMO) | Source: Ambulatory Visit

## 2020-09-17 ENCOUNTER — Ambulatory Visit (INDEPENDENT_AMBULATORY_CARE_PROVIDER_SITE_OTHER): Payer: Managed Care, Other (non HMO) | Admitting: Pulmonary Disease

## 2020-09-17 ENCOUNTER — Other Ambulatory Visit: Payer: Self-pay

## 2020-09-17 ENCOUNTER — Encounter: Payer: Self-pay | Admitting: Pulmonary Disease

## 2020-09-17 VITALS — BP 140/80 | HR 73 | Temp 98.6°F | Ht 68.5 in | Wt 194.4 lb

## 2020-09-17 DIAGNOSIS — J432 Centrilobular emphysema: Secondary | ICD-10-CM | POA: Diagnosis not present

## 2020-09-17 DIAGNOSIS — Z87891 Personal history of nicotine dependence: Secondary | ICD-10-CM

## 2020-09-17 DIAGNOSIS — C3491 Malignant neoplasm of unspecified part of right bronchus or lung: Secondary | ICD-10-CM | POA: Diagnosis not present

## 2020-09-17 MED ORDER — ANORO ELLIPTA 62.5-25 MCG/INH IN AEPB: 1.0000 | INHALATION_SPRAY | Freq: Every day | RESPIRATORY_TRACT | 6 refills | Status: DC

## 2020-09-17 MED ORDER — ANORO ELLIPTA 62.5-25 MCG/INH IN AEPB: 1.0000 | INHALATION_SPRAY | Freq: Every day | RESPIRATORY_TRACT | 0 refills | Status: DC

## 2020-09-17 NOTE — Patient Instructions (Signed)
Thank you for visiting Dr. Valeta Harms at [facility]. Today we recommend the following:  Anoro samples New prescription for Anoro and Albuterol inhaler  Return in about 6 months (around 03/20/2021).    Please do your part to reduce the spread of COVID-19.

## 2020-09-17 NOTE — Progress Notes (Signed)
Synopsis: Referred in May 2022 for s/p bronch PCP: by Paul Salts, PA-C  Subjective:   PATIENT ID: Paul Paul GENDER: male DOB: 06/29/1960, MRN: [id number]  Chief Complaint  Patient presents with  . Follow-up    Follow up after bronch on 04/26.  Pt stated he is doing well. Starts chemo next week.    This is a 60 year old gentleman past medical history of hypothyroidism, works as a Chief Strategy Officer.  He longstanding history of smoking.  Quit 10 years ago.  Patient initially presented to [facility] emergency department.  Had a CT scan on 08/10/2020 with lung mass.  Patient was brought into the hospital for evaluation consultation on 09/01/2020 followed by bronchoscopy for tissue sampling.  Patient had a 4 x 6 x 2 cm right upper lobe hilar mass concerning for malignancy.  Patient ultimately underwent a nuclear medicine PET scan on 06/05/2020.  This nuclear medicine pet image revealed a perihilar right upper lobe mass with concern for postobstructive pneumonia and a 1 cm low right paratracheal and subcarinal node.  Patient had pathology reports that came back positive for malignancy.  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  Patient here today with complaints of dyspnea on exertion and shortness of breath.  CT imaging does have evidence of centrilobular emphysema.  Patient likely has a diagnosis of COPD.  He is not on any current maintenance inhalers.   Past Medical History:  Diagnosis Date  . Hypothyroidism   . Thyroid disease      Family History  Problem Relation Age of Onset  . Cancer Mother      Past Surgical History:  Procedure Laterality Date  . APPENDECTOMY    . BRONCHIAL BIOPSY  09/01/2020   Procedure: BRONCHIAL BIOPSIES;  Surgeon: Paul Nash, DO;  Location: [place] [facility];  Service: Pulmonary;;  . BRONCHIAL BRUSHINGS  09/01/2020   Procedure: BRONCHIAL BRUSHINGS;   Surgeon: Paul Nash, DO;  Location: [place] [facility];  Service: Pulmonary;;  . BRONCHIAL NEEDLE ASPIRATION BIOPSY  09/01/2020   Procedure: BRONCHIAL NEEDLE ASPIRATION BIOPSIES;  Surgeon: Paul Nash, DO;  Location: [place] [facility];  Service: Pulmonary;;  . BRONCHIAL WASHINGS  09/01/2020   Procedure: BRONCHIAL WASHINGS;  Surgeon: Paul Nash, DO;  Location: [facility];  Service: Pulmonary;;  . HERNIA REPAIR    . INCISION AND DRAINAGE ABSCESS N/A 08/25/2017   Procedure: INCISION AND DRAINAGE perineal  ABSCESS;  Surgeon: Paul Aloe, MD;  Location: [facility];  Service: Urology;  Laterality: N/A;  . VIDEO BRONCHOSCOPY WITH ENDOBRONCHIAL ULTRASOUND N/A 09/01/2020   Procedure: VIDEO BRONCHOSCOPY WITH ENDOBRONCHIAL ULTRASOUND;  Surgeon: Paul Nash, DO;  Location: [place];  Service: Pulmonary;  Laterality: N/A;    Social History   Socioeconomic History  . Marital status: Single    Spouse name: Not on file  . Number of children: Not on file  . Years of education: Not on file  . Highest education level: Not on file  Occupational History  . Not on file  Tobacco Use  . Smoking status: Former Smoker    Packs/day: 2.00    Years: 30.00    Pack years: 60.00    Types: Cigarettes  . Smokeless tobacco: Never Used  Vaping Use  . Vaping Use: Never used  Substance and Sexual Activity  . Alcohol use: Yes    Alcohol/week: 1.0 - 2.0 standard drink  Types: 1 - 2 Cans of beer per week    Comment: few beers everyday  . Drug use: Never  . Sexual activity: Not on file  Other Topics Concern  . Not on file  Social History Narrative  . Not on file   Social Determinants of Health   Financial Resource Strain: Not on file  Food Insecurity: Not on file  Transportation Needs: Not on file  Physical Activity: Not on file  Stress: Not on file  Social Connections: Not on file  Intimate Partner Violence: Not on file     No Known Allergies   Outpatient Medications Prior to Visit   Medication Sig Dispense Refill  . levothyroxine (SYNTHROID) 112 MCG tablet Take 112 mcg by mouth in the morning.    . prochlorperazine (COMPAZINE) 10 MG tablet Take 1 tablet (10 mg total) by mouth every 6 (six) hours as needed for nausea or vomiting. (Patient not taking: Reported on 09/17/2020) 30 tablet 0   No facility-administered medications prior to visit.    Review of Systems  Constitutional: Negative for chills, fever, malaise/fatigue and weight loss.  HENT: Negative for hearing loss, sore throat and tinnitus.   Eyes: Negative for blurred vision and double vision.  Respiratory: Negative for cough, hemoptysis, sputum production, shortness of breath, wheezing and stridor.   Cardiovascular: Negative for chest pain, palpitations, orthopnea, leg swelling and PND.  Gastrointestinal: Negative for abdominal pain, constipation, diarrhea, heartburn, nausea and vomiting.  Genitourinary: Negative for dysuria, hematuria and urgency.  Musculoskeletal: Negative for joint pain and myalgias.  Skin: Negative for itching and rash.  Neurological: Negative for dizziness, tingling, weakness and headaches.  Endo/Heme/Allergies: Negative for environmental allergies. Does not bruise/bleed easily.  Psychiatric/Behavioral: Negative for depression. The patient is not nervous/anxious and does not have insomnia.   All other systems reviewed and are negative.    Objective:  Physical Exam Vitals reviewed.  Constitutional:      General: He is not in acute distress.    Appearance: He is well-developed.  HENT:     Head: Normocephalic and atraumatic.  Eyes:     General: No scleral icterus.    Conjunctiva/sclera: Conjunctivae normal.     Pupils: Pupils are equal, round, and reactive to light.  Neck:     Vascular: No JVD.     Trachea: No tracheal deviation.  Cardiovascular:     Rate and Rhythm: Normal rate and regular rhythm.     Heart sounds: Normal heart sounds. No murmur heard.   Pulmonary:      Effort: Pulmonary effort is normal. No tachypnea, accessory muscle usage or respiratory distress.     Breath sounds: No stridor. No wheezing, rhonchi or rales.     Comments: Diminished breath sounds bilaterally Abdominal:     General: Bowel sounds are normal. There is no distension.     Palpations: Abdomen is soft.     Tenderness: There is no abdominal tenderness.  Musculoskeletal:        General: No tenderness.     Cervical back: Neck supple.  Lymphadenopathy:     Cervical: No cervical adenopathy.  Skin:    General: Skin is warm and dry.     Capillary Refill: Capillary refill takes less than 2 seconds.     Findings: No rash.  Neurological:     Mental Status: He is alert and oriented to person, place, and time.  Psychiatric:        Behavior: Behavior normal.      Vitals:  09/17/20 1346  BP: 140/80  Pulse: 73  Temp: 98.6 F (37 C)  TempSrc: Tympanic  SpO2: 97%  Weight: 194 lb 6 oz (88.2 kg)  Height: 5' 8.5" (1.74 m)   97% on RA BMI Readings from Last 3 Encounters:  09/17/20 29.12 kg/m  09/08/20 28.50 kg/m  09/01/20 28.35 kg/m   Wt Readings from Last 3 Encounters:  09/17/20 194 lb 6 oz (88.2 kg)  09/08/20 193 lb (87.5 kg)  09/01/20 192 lb (87.1 kg)     CBC    Component Value Date/Time   WBC 7.2 09/08/2020 1323   WBC 10.6 (H) 08/10/2020 2103   RBC 4.91 09/08/2020 1323   HGB 14.3 09/08/2020 1323   HCT 42.7 09/08/2020 1323   PLT 234 09/08/2020 1323   MCV 87.0 09/08/2020 1323   MCH 29.1 09/08/2020 1323   MCHC 33.5 09/08/2020 1323   RDW 12.4 09/08/2020 1323   LYMPHSABS 1.6 09/08/2020 1323   MONOABS 0.7 09/08/2020 1323   EOSABS 0.2 09/08/2020 1323   BASOSABS 0.0 09/08/2020 1323    Chest Imaging:  CT chest 08/10/2020: 6 cm right upper lobe hilar lesion concerning for bronchogenic carcinoma with associated adenopathy. The patient's images have been independently reviewed by me.    09/02/2020 nuclear medicine pet imaging: PET avid right upper lobe hilar  lesion concerning for an advanced age bronchogenic carcinoma with associated adenopathy. The patient's images have been independently reviewed by me.    Pulmonary Functions Testing Results: No flowsheet data found.  FeNO:   Pathology:   Echocardiogram:   Heart Catheterization:     Assessment & Plan:     ICD-10-CM   1. 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 Centrilobular emphysema ([place])  J43.2   3. Former smoker  Z87.891     Discussion:  This is a 60 year old gentleman, recent diagnosis of stage III squamous cell carcinoma of the lung, former smoker quit approximately 10 years ago, associated centrilobular emphysema on CT imaging.  Currently undergoing chemotherapy which is planned for next week.  He likely has COPD.  Has not had full pulmonary function test in the past.  Would consider getting these in the future.  Plan: Start patient on Anoro Ellipta Patient given samples and a new prescription New albuterol prescription as well.   Current Outpatient Medications:  .  levothyroxine (SYNTHROID) 112 MCG tablet, Take 112 mcg by mouth in the morning., Disp: , Rfl:  .  prochlorperazine (COMPAZINE) 10 MG tablet, Take 1 tablet (10 mg total) by mouth every 6 (six) hours as needed for nausea or vomiting. (Patient not taking: Reported on 09/17/2020), Disp: 30 tablet, Rfl: 0  I spent 32 minutes dedicated to the care of this patient on the date of this encounter to include pre-visit review of records, face-to-face time with the patient discussing conditions above, post visit ordering of testing, clinical documentation with the electronic health record, making appropriate referrals as documented, and communicating necessary findings to members of the patients care team.   Paul Paul, [facility] 09/17/2020 1:59 PM

## 2020-09-18 ENCOUNTER — Encounter: Payer: Self-pay | Admitting: *Deleted

## 2020-09-18 NOTE — Progress Notes (Signed)
I followed up on Mr. Kissoon schedule per treatment plan.  He is set up for all appts with 1st treatment on 5/17.

## 2020-09-21 ENCOUNTER — Other Ambulatory Visit: Payer: Self-pay | Admitting: Physician Assistant

## 2020-09-21 DIAGNOSIS — C3491 Malignant neoplasm of unspecified part of right bronchus or lung: Secondary | ICD-10-CM

## 2020-09-22 ENCOUNTER — Other Ambulatory Visit: Payer: Self-pay

## 2020-09-22 ENCOUNTER — Other Ambulatory Visit: Payer: Self-pay | Admitting: Internal Medicine

## 2020-09-22 ENCOUNTER — Inpatient Hospital Stay: Payer: Managed Care, Other (non HMO)

## 2020-09-22 VITALS — BP 126/81 | HR 70 | Temp 98.5°F | Resp 16

## 2020-09-22 DIAGNOSIS — Z510 Encounter for antineoplastic radiation therapy: Secondary | ICD-10-CM | POA: Diagnosis not present

## 2020-09-22 DIAGNOSIS — C3491 Malignant neoplasm of unspecified part of right bronchus or lung: Secondary | ICD-10-CM

## 2020-09-22 LAB — CBC WITH DIFFERENTIAL (CANCER CENTER ONLY)
Abs Immature Granulocytes: 0.02 10*3/uL (ref 0.00–0.07)
Basophils Absolute: 0 10*3/uL (ref 0.0–0.1)
Basophils Relative: 1 %
Eosinophils Absolute: 0.3 10*3/uL (ref 0.0–0.5)
Eosinophils Relative: 6 %
HCT: 41 % (ref 39.0–52.0)
Hemoglobin: 14.5 g/dL (ref 13.0–17.0)
Immature Granulocytes: 0 %
Lymphocytes Relative: 22 %
Lymphs Abs: 1.2 10*3/uL (ref 0.7–4.0)
MCH: 29.6 pg (ref 26.0–34.0)
MCHC: 35.4 g/dL (ref 30.0–36.0)
MCV: 83.7 fL (ref 80.0–100.0)
Monocytes Absolute: 0.6 10*3/uL (ref 0.1–1.0)
Monocytes Relative: 11 %
Neutro Abs: 3.3 10*3/uL (ref 1.7–7.7)
Neutrophils Relative %: 60 %
Platelet Count: 233 10*3/uL (ref 150–400)
RBC: 4.9 MIL/uL (ref 4.22–5.81)
RDW: 12.4 % (ref 11.5–15.5)
WBC Count: 5.5 10*3/uL (ref 4.0–10.5)
nRBC: 0 % (ref 0.0–0.2)

## 2020-09-22 LAB — CMP (CANCER CENTER ONLY)
ALT: 19 U/L (ref 0–44)
AST: 16 U/L (ref 15–41)
Albumin: 3.5 g/dL (ref 3.5–5.0)
Alkaline Phosphatase: 80 U/L (ref 38–126)
Anion gap: 7 (ref 5–15)
BUN: 16 mg/dL (ref 6–20)
CO2: 25 mmol/L (ref 22–32)
Calcium: 9.3 mg/dL (ref 8.9–10.3)
Chloride: 107 mmol/L (ref 98–111)
Creatinine: 0.81 mg/dL (ref 0.61–1.24)
GFR, Estimated: >60 mL/min (ref >60)
Glucose, Bld: 127 mg/dL — ABNORMAL HIGH (ref 70–99)
Potassium: 4.2 mmol/L (ref 3.5–5.1)
Sodium: 139 mmol/L (ref 135–145)
Total Bilirubin: 0.4 mg/dL (ref 0.3–1.2)
Total Protein: 7.9 g/dL (ref 6.5–8.1)

## 2020-09-22 MED ORDER — SODIUM CHLORIDE 0.9 % IV SOLN
287.2000 mg | Freq: Once | INTRAVENOUS | Status: AC
  Administered 2020-09-22: 290 mg via INTRAVENOUS
  Filled 2020-09-22: qty 29

## 2020-09-22 MED ORDER — DIPHENHYDRAMINE HCL 50 MG/ML IJ SOLN
INTRAMUSCULAR | Status: AC
  Filled 2020-09-22: qty 1

## 2020-09-22 MED ORDER — PALONOSETRON HCL INJECTION 0.25 MG/5ML
INTRAVENOUS | Status: AC
  Filled 2020-09-22: qty 5

## 2020-09-22 MED ORDER — FAMOTIDINE 20 MG IN NS 100 ML IVPB
20.0000 mg | Freq: Once | INTRAVENOUS | Status: AC
  Administered 2020-09-22: 20 mg via INTRAVENOUS

## 2020-09-22 MED ORDER — PALONOSETRON HCL INJECTION 0.25 MG/5ML
0.2500 mg | Freq: Once | INTRAVENOUS | Status: AC
  Administered 2020-09-22: 0.25 mg via INTRAVENOUS

## 2020-09-22 MED ORDER — SODIUM CHLORIDE 0.9 % IV SOLN
45.0000 mg/m2 | Freq: Once | INTRAVENOUS | Status: AC
  Administered 2020-09-22: 90 mg via INTRAVENOUS
  Filled 2020-09-22: qty 15

## 2020-09-22 MED ORDER — FAMOTIDINE 20 MG IN NS 100 ML IVPB
INTRAVENOUS | Status: AC
  Filled 2020-09-22: qty 100

## 2020-09-22 MED ORDER — DIPHENHYDRAMINE HCL 50 MG/ML IJ SOLN
50.0000 mg | Freq: Once | INTRAMUSCULAR | Status: AC
  Administered 2020-09-22: 50 mg via INTRAVENOUS

## 2020-09-22 MED ORDER — DEXAMETHASONE SODIUM PHOSPHATE 100 MG/10ML IJ SOLN
20.0000 mg | Freq: Once | INTRAMUSCULAR | Status: AC
  Administered 2020-09-22: 20 mg via INTRAVENOUS
  Filled 2020-09-22: qty 20

## 2020-09-22 MED ORDER — SODIUM CHLORIDE 0.9 % IV SOLN
Freq: Once | INTRAVENOUS | Status: AC
  Filled 2020-09-22: qty 250

## 2020-09-22 NOTE — Patient Instructions (Addendum)
[facility]    Discharge Instructions: Thank you for choosing [facility] to provide your oncology and hematology care.   If you have a lab appointment with the [facility], please go directly to the [facility] and check in at the registration area.   Wear comfortable clothing and clothing appropriate for easy access to any Portacath or PICC line.   We strive to give you quality time with your provider. You may need to reschedule your appointment if you arrive late (15 or more minutes).  Arriving late affects you and other patients whose appointments are after yours.  Also, if you miss three or more appointments without notifying the office, you may be dismissed from the clinic at the provider's discretion.      For prescription refill requests, have your pharmacy contact our office and allow 72 hours for refills to be completed.    Today you received the following chemotherapy and/or immunotherapy agents: paclitaxel and carboplatin.    To help prevent nausea and vomiting after your treatment, we encourage you to take your nausea medication as directed.  BELOW ARE SYMPTOMS THAT SHOULD BE REPORTED IMMEDIATELY: . *FEVER GREATER THAN 100.4 F (38 C) OR HIGHER . *CHILLS OR SWEATING . *NAUSEA AND VOMITING THAT IS NOT CONTROLLED WITH YOUR NAUSEA MEDICATION . *UNUSUAL SHORTNESS OF BREATH . *UNUSUAL BRUISING OR BLEEDING . *URINARY PROBLEMS (pain or burning when urinating, or frequent urination) . *BOWEL PROBLEMS (unusual diarrhea, constipation, pain near the anus) . TENDERNESS IN MOUTH AND THROAT WITH OR WITHOUT PRESENCE OF ULCERS (sore throat, sores in mouth, or a toothache) . UNUSUAL RASH, SWELLING OR PAIN  . UNUSUAL VAGINAL DISCHARGE OR ITCHING   Items with * indicate a potential emergency and should be followed up as soon as possible or go to the Emergency Department if any problems should occur.  Please show the CHEMOTHERAPY ALERT CARD or  IMMUNOTHERAPY ALERT CARD at check-in to the Emergency Department and triage nurse.  Should you have questions after your visit or need to cancel or reschedule your appointment, please contact [facility]  Dept: [phone]  and follow the prompts.  Office hours are 8:00 a.m. to 4:30 p.m. Monday - Friday. Please note that voicemails left after 4:00 p.m. may not be returned until the following business day.  We are closed weekends and major holidays. You have access to a nurse at all times for urgent questions. Please call the main number to the clinic Dept: [phone] and follow the prompts.   For any non-urgent questions, you may also contact your provider using MyChart. We now offer e-Visits for anyone 34 and older to request care online for non-urgent symptoms. For details visit [email or web address].   Also download the MyChart app! Go to the app store, search "MyChart", open the app, select [facility], and log in with your MyChart username and password.  Due to Covid, a mask is required upon entering the hospital/clinic. If you do not have a mask, one will be given to you upon arrival. For doctor visits, patients may have 1 support person aged 36 or older with them. For treatment visits, patients cannot have anyone with them due to current Covid guidelines and our immunocompromised population.   Paclitaxel injection What is this medicine? PACLITAXEL (PAK li TAX el) is a chemotherapy drug. It targets fast dividing cells, like cancer cells, and causes these cells to die. This medicine is used to treat ovarian cancer,  breast cancer, lung cancer, Kaposi's sarcoma, and other cancers. This medicine may be used for other purposes; ask your health care provider or pharmacist if you have questions. COMMON BRAND NAME(S): Onxol, Taxol What should I tell my health care provider before I take this medicine? They need to know if you have any of these conditions:  history  of irregular heartbeat  liver disease  low blood counts, like low white cell, platelet, or red cell counts  lung or breathing disease, like asthma  tingling of the fingers or toes, or other nerve disorder  an unusual or allergic reaction to paclitaxel, alcohol, polyoxyethylated castor oil, other chemotherapy, other medicines, foods, dyes, or preservatives  pregnant or trying to get pregnant  breast-feeding How should I use this medicine? This drug is given as an infusion into a vein. It is administered in a hospital or clinic by a specially trained health care professional. Talk to your pediatrician regarding the use of this medicine in children. Special care may be needed. Overdosage: If you think you have taken too much of this medicine contact a poison control center or emergency room at once. NOTE: This medicine is only for you. Do not share this medicine with others. What if I miss a dose? It is important not to miss your dose. Call your doctor or health care professional if you are unable to keep an appointment. What may interact with this medicine? Do not take this medicine with any of the following medications:  live virus vaccines This medicine may also interact with the following medications:  antiviral medicines for hepatitis, HIV or AIDS  certain antibiotics like erythromycin and clarithromycin  certain medicines for fungal infections like ketoconazole and itraconazole  certain medicines for seizures like carbamazepine, phenobarbital, phenytoin  gemfibrozil  nefazodone  rifampin  St. John's wort This list may not describe all possible interactions. Give your health care provider a list of all the medicines, herbs, non-prescription drugs, or dietary supplements you use. Also tell them if you smoke, drink alcohol, or use illegal drugs. Some items may interact with your medicine. What should I watch for while using this medicine? Your condition will be monitored  carefully while you are receiving this medicine. You will need important blood work done while you are taking this medicine. This medicine can cause serious allergic reactions. To reduce your risk you will need to take other medicine(s) before treatment with this medicine. If you experience allergic reactions like skin rash, itching or hives, swelling of the face, lips, or tongue, tell your doctor or health care professional right away. In some cases, you may be given additional medicines to help with side effects. Follow all directions for their use. This drug may make you feel generally unwell. This is not uncommon, as chemotherapy can affect healthy cells as well as cancer cells. Report any side effects. Continue your course of treatment even though you feel ill unless your doctor tells you to stop. Call your doctor or health care professional for advice if you get a fever, chills or sore throat, or other symptoms of a cold or flu. Do not treat yourself. This drug decreases your body's ability to fight infections. Try to avoid being around people who are sick. This medicine may increase your risk to bruise or bleed. Call your doctor or health care professional if you notice any unusual bleeding. Be careful brushing and flossing your teeth or using a toothpick because you may get an infection or bleed more easily.  If you have any dental work done, tell your dentist you are receiving this medicine. Avoid taking products that contain aspirin, acetaminophen, ibuprofen, naproxen, or ketoprofen unless instructed by your doctor. These medicines may hide a fever. Do not become pregnant while taking this medicine. Women should inform their doctor if they wish to become pregnant or think they might be pregnant. There is a potential for serious side effects to an unborn child. Talk to your health care professional or pharmacist for more information. Do not breast-feed an infant while taking this medicine. Men are  advised not to father a child while receiving this medicine. This product may contain alcohol. Ask your pharmacist or healthcare provider if this medicine contains alcohol. Be sure to tell all healthcare providers you are taking this medicine. Certain medicines, like metronidazole and disulfiram, can cause an unpleasant reaction when taken with alcohol. The reaction includes flushing, headache, nausea, vomiting, sweating, and increased thirst. The reaction can last from 30 minutes to several hours. What side effects may I notice from receiving this medicine? Side effects that you should report to your doctor or health care professional as soon as possible:  allergic reactions like skin rash, itching or hives, swelling of the face, lips, or tongue  breathing problems  changes in vision  fast, irregular heartbeat  high or low blood pressure  mouth sores  pain, tingling, numbness in the hands or feet  signs of decreased platelets or bleeding - bruising, pinpoint red spots on the skin, black, tarry stools, blood in the urine  signs of decreased red blood cells - unusually weak or tired, feeling faint or lightheaded, falls  signs of infection - fever or chills, cough, sore throat, pain or difficulty passing urine  signs and symptoms of liver injury like dark yellow or brown urine; general ill feeling or flu-like symptoms; light-colored stools; loss of appetite; nausea; right upper belly pain; unusually weak or tired; yellowing of the eyes or skin  swelling of the ankles, feet, hands  unusually slow heartbeat Side effects that usually do not require medical attention (report to your doctor or health care professional if they continue or are bothersome):  diarrhea  hair loss  loss of appetite  muscle or joint pain  nausea, vomiting  pain, redness, or irritation at site where injected  tiredness This list may not describe all possible side effects. Call your doctor for medical  advice about side effects. You may report side effects to FDA at 1-800-FDA-1088. Where should I keep my medicine? This drug is given in a hospital or clinic and will not be stored at home. NOTE: This sheet is a summary. It may not cover all possible information. If you have questions about this medicine, talk to your doctor, pharmacist, or health care provider.  2021 Elsevier/Gold Standard (2019-03-27 13:37:23)  Carboplatin injection What is this medicine? CARBOPLATIN (KAR boe pla tin) is a chemotherapy drug. It targets fast dividing cells, like cancer cells, and causes these cells to die. This medicine is used to treat ovarian cancer and many other cancers. This medicine may be used for other purposes; ask your health care provider or pharmacist if you have questions. COMMON BRAND NAME(S): Paraplatin What should I tell my health care provider before I take this medicine? They need to know if you have any of these conditions:  blood disorders  hearing problems  kidney disease  recent or ongoing radiation therapy  an unusual or allergic reaction to carboplatin, cisplatin, other chemotherapy, other  medicines, foods, dyes, or preservatives  pregnant or trying to get pregnant  breast-feeding How should I use this medicine? This drug is usually given as an infusion into a vein. It is administered in a hospital or clinic by a specially trained health care professional. Talk to your pediatrician regarding the use of this medicine in children. Special care may be needed. Overdosage: If you think you have taken too much of this medicine contact a poison control center or emergency room at once. NOTE: This medicine is only for you. Do not share this medicine with others. What if I miss a dose? It is important not to miss a dose. Call your doctor or health care professional if you are unable to keep an appointment. What may interact with this medicine?  medicines for seizures  medicines  to increase blood counts like filgrastim, pegfilgrastim, sargramostim  some antibiotics like amikacin, gentamicin, neomycin, streptomycin, tobramycin  vaccines Talk to your doctor or health care professional before taking any of these medicines:  acetaminophen  aspirin  ibuprofen  ketoprofen  naproxen This list may not describe all possible interactions. Give your health care provider a list of all the medicines, herbs, non-prescription drugs, or dietary supplements you use. Also tell them if you smoke, drink alcohol, or use illegal drugs. Some items may interact with your medicine. What should I watch for while using this medicine? Your condition will be monitored carefully while you are receiving this medicine. You will need important blood work done while you are taking this medicine. This drug may make you feel generally unwell. This is not uncommon, as chemotherapy can affect healthy cells as well as cancer cells. Report any side effects. Continue your course of treatment even though you feel ill unless your doctor tells you to stop. In some cases, you may be given additional medicines to help with side effects. Follow all directions for their use. Call your doctor or health care professional for advice if you get a fever, chills or sore throat, or other symptoms of a cold or flu. Do not treat yourself. This drug decreases your body's ability to fight infections. Try to avoid being around people who are sick. This medicine may increase your risk to bruise or bleed. Call your doctor or health care professional if you notice any unusual bleeding. Be careful brushing and flossing your teeth or using a toothpick because you may get an infection or bleed more easily. If you have any dental work done, tell your dentist you are receiving this medicine. Avoid taking products that contain aspirin, acetaminophen, ibuprofen, naproxen, or ketoprofen unless instructed by your doctor. These medicines  may hide a fever. Do not become pregnant while taking this medicine. Women should inform their doctor if they wish to become pregnant or think they might be pregnant. There is a potential for serious side effects to an unborn child. Talk to your health care professional or pharmacist for more information. Do not breast-feed an infant while taking this medicine. What side effects may I notice from receiving this medicine? Side effects that you should report to your doctor or health care professional as soon as possible:  allergic reactions like skin rash, itching or hives, swelling of the face, lips, or tongue  signs of infection - fever or chills, cough, sore throat, pain or difficulty passing urine  signs of decreased platelets or bleeding - bruising, pinpoint red spots on the skin, black, tarry stools, nosebleeds  signs of decreased red blood cells -  unusually weak or tired, fainting spells, lightheadedness  breathing problems  changes in hearing  changes in vision  chest pain  high blood pressure  low blood counts - This drug may decrease the number of white blood cells, red blood cells and platelets. You may be at increased risk for infections and bleeding.  nausea and vomiting  pain, swelling, redness or irritation at the injection site  pain, tingling, numbness in the hands or feet  problems with balance, talking, walking  trouble passing urine or change in the amount of urine Side effects that usually do not require medical attention (report to your doctor or health care professional if they continue or are bothersome):  hair loss  loss of appetite  metallic taste in the mouth or changes in taste This list may not describe all possible side effects. Call your doctor for medical advice about side effects. You may report side effects to FDA at 1-800-FDA-1088. Where should I keep my medicine? This drug is given in a hospital or clinic and will not be stored at  home. NOTE: This sheet is a summary. It may not cover all possible information. If you have questions about this medicine, talk to your doctor, pharmacist, or health care provider.  2021 Elsevier/Gold Standard (2007-07-31 14:38:05)

## 2020-09-23 ENCOUNTER — Telehealth: Payer: Self-pay | Admitting: *Deleted

## 2020-09-23 ENCOUNTER — Encounter: Payer: Self-pay | Admitting: Radiation Oncology

## 2020-09-23 ENCOUNTER — Ambulatory Visit
Admission: RE | Admit: 2020-09-23 | Discharge: 2020-09-23 | Disposition: A | Discharge status: Home / Self Care | Payer: Managed Care, Other (non HMO) | Source: Ambulatory Visit | Attending: Radiation Oncology | Admitting: Radiation Oncology

## 2020-09-23 VITALS — BP 134/92 | HR 67 | Temp 97.9°F | Resp 20 | Ht 68.5 in | Wt 198.6 lb

## 2020-09-23 DIAGNOSIS — Z79899 Other long term (current) drug therapy: Secondary | ICD-10-CM | POA: Diagnosis not present

## 2020-09-23 DIAGNOSIS — I700 Atherosclerosis of aorta: Secondary | ICD-10-CM | POA: Diagnosis not present

## 2020-09-23 DIAGNOSIS — E039 Hypothyroidism, unspecified: Secondary | ICD-10-CM | POA: Diagnosis not present

## 2020-09-23 DIAGNOSIS — J432 Centrilobular emphysema: Secondary | ICD-10-CM | POA: Diagnosis not present

## 2020-09-23 DIAGNOSIS — C3491 Malignant neoplasm of unspecified part of right bronchus or lung: Secondary | ICD-10-CM

## 2020-09-23 DIAGNOSIS — C3411 Malignant neoplasm of upper lobe, right bronchus or lung: Secondary | ICD-10-CM | POA: Diagnosis not present

## 2020-09-23 DIAGNOSIS — Z87891 Personal history of nicotine dependence: Secondary | ICD-10-CM | POA: Diagnosis not present

## 2020-09-23 NOTE — Telephone Encounter (Signed)
Called pt to discuss how he did with his treatment yest.  He reports doing well & denies any c/o's.  He has radiation appt today & thought he would get radiation treatment today.  Informed that this is the consult visit & plan of care will be addressed today.  He expressed understanding & states he knows how to reach [place] if needed.

## 2020-09-23 NOTE — Progress Notes (Signed)
Radiation Oncology         [phone] ________________________________  Initial Outpatient Consultation  Name: Paul Mclean MRN: [id number]  Date: 09/23/2020  DOB: 02/23/1961  YI:RSWNI, Carloyn Manner, MD  Curt Bears, MD   REFERRING PHYSICIAN: Curt Bears, MD  DIAGNOSIS: Stage III squamous cell carcinoma of right lung   HISTORY OF PRESENT ILLNESS::Paul Mclean is a 60 y.o. male who is accompanied by his fiance. He is seen as a courtesy of Dr. Julien Nordmann for an opinion concerning radiation therapy as part of management for his recently diagnosed squamous cell carcinoma. The patient presented to ED on 08/10/20 with atypical chest pain that started around 2:00pm that day, stating that he experienced pain while inhaling. Patient received chest CT showing anterior right upper lobe mass extending into the hilum measuring 6.2 cm, concerning for pneumonia, as well as adjacent reticulonodular opacities and small right plural effusion.   Patient underwent bronchoscopy on 09/01/20. Results from needle aspiration and brushing showed malignant cells consistent with non-small cell carcinoma, the morphology is consistent with that of squamous cell carcinoma.   Patient received PET scan on 09/02/20 showing progressive postobstructive pneumonitis within the anterior and anteromedial right upper lobe. Within this area there is a new nodular density measuring 1.2 cm. Intense FDG uptake was seen associated with right upper lobe perihilar lung mass, compatible with primary bronchogenic carcinoma. Small, less than 1 cm, low right paratracheal and subcarinal lymph nodes exhibit mild FDG uptake just above blood pool activity. Aortic Atherosclerosis and Emphysema were detected.   Patient met Dr. Julien Nordmann for consult on 09/08/20 and discussed chemotherapy treatment options and recommended for him a course of concurrent chemoradiation with weekly carboplatin for for 6-7 weeks. He received his first infusion on  09/22/20.  Patient completed his staging workup with MRI on 09/16/20 showing no evidence of intracranial metastatic disease.   PREVIOUS RADIATION THERAPY: No  PAST MEDICAL HISTORY:  Past Medical History:  Diagnosis Date  . Hypothyroidism   . Thyroid disease     PAST SURGICAL HISTORY: Past Surgical History:  Procedure Laterality Date  . APPENDECTOMY    . BRONCHIAL BIOPSY  09/01/2020   Procedure: BRONCHIAL BIOPSIES;  Surgeon: Garner Nash, DO;  Location: [facility];  Service: Pulmonary;;  . BRONCHIAL BRUSHINGS  09/01/2020   Procedure: BRONCHIAL BRUSHINGS;  Surgeon: Garner Nash, DO;  Location: [facility];  Service: Pulmonary;;  . BRONCHIAL NEEDLE ASPIRATION BIOPSY  09/01/2020   Procedure: BRONCHIAL NEEDLE ASPIRATION BIOPSIES;  Surgeon: Garner Nash, DO;  Location: [facility];  Service: Pulmonary;;  . BRONCHIAL WASHINGS  09/01/2020   Procedure: BRONCHIAL WASHINGS;  Surgeon: Garner Nash, DO;  Location: [facility];  Service: Pulmonary;;  . HERNIA REPAIR    . INCISION AND DRAINAGE ABSCESS N/A 08/25/2017   Procedure: INCISION AND DRAINAGE perineal  ABSCESS;  Surgeon: Festus Aloe, MD;  Location: [facility];  Service: Urology;  Laterality: N/A;  . VIDEO BRONCHOSCOPY WITH ENDOBRONCHIAL ULTRASOUND N/A 09/01/2020   Procedure: VIDEO BRONCHOSCOPY WITH ENDOBRONCHIAL ULTRASOUND;  Surgeon: Garner Nash, DO;  Location: [facility];  Service: Pulmonary;  Laterality: N/A;    FAMILY HISTORY:  Family History  Problem Relation Age of Onset  . Cancer Mother     SOCIAL HISTORY:  Social History   Tobacco Use  . Smoking status: Former Smoker    Packs/day: 2.00    Years: 30.00    Pack years: 60.00    Types: Cigarettes  . Smokeless tobacco: Never Used  Vaping  Use  . Vaping Use: Never used  Substance Use Topics  . Alcohol use: Yes    Alcohol/week: 1.0 - 2.0 standard drink    Types: 1 - 2 Cans of beer per week    Comment: few beers everyday  . Drug use: Never     ALLERGIES: No Known Allergies  MEDICATIONS:  Current Outpatient Medications  Medication Sig Dispense Refill  . levothyroxine (SYNTHROID) 112 MCG tablet Take 112 mcg by mouth in the morning.    . prochlorperazine (COMPAZINE) 10 MG tablet Take 1 tablet (10 mg total) by mouth every 6 (six) hours as needed for nausea or vomiting. 30 tablet 0  . umeclidinium-vilanterol (ANORO ELLIPTA) 62.5-25 MCG/INH AEPB Inhale 1 puff into the lungs daily. 2 each 0  . umeclidinium-vilanterol (ANORO ELLIPTA) 62.5-25 MCG/INH AEPB Inhale 1 puff into the lungs daily. 60 each 6   No current facility-administered medications for this encounter.    REVIEW OF SYSTEMS:  A 10+ POINT REVIEW OF SYSTEMS WAS OBTAINED including neurology, dermatology, psychiatry, cardiac, respiratory, lymph, extremities, GI, GU, musculoskeletal, constitutional, reproductive, HEENT.  He has occasional coughing but no hemoptysis.  His pain along the chest area has improved significantly.  He denies any new bony pain headaches dizziness or blurred vision.   PHYSICAL EXAM:  height is 5' 8.5" (1.74 m) and weight is 198 lb 9.6 oz (90.1 kg). His temperature is 97.9 F (36.6 C). His blood pressure is 134/92 (abnormal) and his pulse is 67. His respiration is 20 and oxygen saturation is 97%.   General: Alert and oriented, in no acute distress HEENT: Head is normocephalic. Extraocular movements are intact.  Dentures are in place along the maxillary region Neck: Neck is supple, no palpable cervical or supraclavicular lymphadenopathy. Heart: Regular in rate and rhythm with no murmurs, rubs, or gallops. Chest: Clear to auscultation bilaterally, with no rhonchi, wheezes, or rales. Abdomen: Soft, nontender, nondistended, with no rigidity or guarding. Extremities: No cyanosis or edema. Lymphatics: see Neck Exam Skin: No concerning lesions. Musculoskeletal: symmetric strength and muscle tone throughout. Neurologic: Cranial nerves II through XII are  grossly intact. No obvious focalities. Speech is fluent. Coordination is intact. Psychiatric: Judgment and insight are intact. Affect is appropriate.   ECOG = 1  0 - Asymptomatic (Fully active, able to carry on all predisease activities without restriction)  1 - Symptomatic but completely ambulatory (Restricted in physically strenuous activity but ambulatory and able to carry out work of a light or sedentary nature. For example, light housework, office work)  2 - Symptomatic, <50% in bed during the day (Ambulatory and capable of all self care but unable to carry out any work activities. Up and about more than 50% of waking hours)  3 - Symptomatic, >50% in bed, but not bedbound (Capable of only limited self-care, confined to bed or chair 50% or more of waking hours)  4 - Bedbound (Completely disabled. Cannot carry on any self-care. Totally confined to bed or chair)  5 - Death   Eustace Pen MM, Creech RH, Tormey DC, et al. [phone]). "Toxicity and response criteria of the [facility]". [place] Oncol. 5 (6): 649-55  LABORATORY DATA:  Lab Results  Component Value Date   WBC 5.5 09/22/2020   HGB 14.5 09/22/2020   HCT 41.0 09/22/2020   MCV 83.7 09/22/2020   PLT 233 09/22/2020   NEUTROABS 3.3 09/22/2020   Lab Results  Component Value Date   NA 139 09/22/2020   K 4.2 09/22/2020  CL 107 09/22/2020   CO2 25 09/22/2020   GLUCOSE 127 (H) 09/22/2020   CREATININE 0.81 09/22/2020   CALCIUM 9.3 09/22/2020      RADIOGRAPHY: MR BRAIN W WO CONTRAST  Result Date: 09/17/2020 CLINICAL DATA:  Lung cancer, staging EXAM: MRI HEAD WITHOUT AND WITH CONTRAST TECHNIQUE: Multiplanar, multiecho pulse sequences of the brain and surrounding structures were obtained without and with intravenous contrast. CONTRAST:  5m GADAVIST GADOBUTROL 1 MMOL/ML IV SOLN COMPARISON:  None. FINDINGS: Brain: There is no acute infarction or intracranial hemorrhage. There is no intracranial mass, mass  effect, or edema. There is no hydrocephalus or extra-axial fluid collection. Ventricles and sulci are within normal limits in size and configuration. A few small foci of T2 hyperintensity in the supratentorial white matter are nonspecific but may reflect minor chronic microvascular ischemic changes. No abnormal enhancement. Vascular: Major vessel flow voids at the skull base are preserved. Skull and upper cervical spine: Normal marrow signal is preserved. Sinuses/Orbits: Minor mucosal thickening.  Orbits are unremarkable. Other: Susceptibility artifact along the left anterior scalp of unknown origin. Sella is unremarkable. Mastoid air cells are clear. IMPRESSION: No evidence of intracranial metastatic disease. Electronically Signed   By: PMacy MisM.D.   On: 09/17/2020 12:29   NM PET Image Initial (PI) Skull Base To Thigh  Result Date: 09/03/2020 CLINICAL DATA:  Initial treatment strategy for non-small cell lung cancer. EXAM: NUCLEAR MEDICINE PET SKULL BASE TO THIGH TECHNIQUE: 9.85 mCi F-18 FDG was injected intravenously. Full-ring PET imaging was performed from the skull base to thigh after the radiotracer. CT data was obtained and used for attenuation correction and anatomic localization. Fasting blood glucose: 100 mg/dl COMPARISON:  CT angio chest 08/10/2020 FINDINGS: Mediastinal blood pool activity: SUV max 2.38 Liver activity: SUV max NA NECK: Asymmetric increased uptake localizing to the inferior pole of left lobe of thyroid gland may reflect FDG avid thyroid nodule. The SUV max is equal to 6.37. No FDG avid lymph nodes identified within the soft tissues of the neck. Incidental CT findings: none CHEST: Right upper lobe perihilar lung mass is FDG avid measuring 5.47 with SUV max of 14.4. Signs of postobstructive pneumonitis is noted with increased ground-glass attenuation in the anterior and anteromedial right upper lobe. Within this area there is a 1.2 cm nodule with SUV max of 3.74, image 26/8. 5.6  mm low right paratracheal lymph node has an SUV max of 3.28, image 67/4. 8 mm subcarinal lymph node has an SUV max of 3.88, image 72/4. Incidental CT findings: Centrilobular emphysema. Aortic atherosclerosis. No pericardial effusion. ABDOMEN/PELVIS: No abnormal hypermetabolic activity within the liver, pancreas, adrenal glands, or spleen. No hypermetabolic lymph nodes in the abdomen or pelvis. Incidental CT findings: Aortic atherosclerosis. No aneurysm. Small fat containing umbilical hernia. SKELETON: No focal hypermetabolic activity to suggest skeletal metastasis. Incidental CT findings: none IMPRESSION: 1. Intense FDG uptake is associated with the right upper lobe perihilar lung mass compatible with primary bronchogenic carcinoma. 2. Progressive postobstructive pneumonitis within the anterior and anteromedial right upper lobe. Within this area there is a new nodular density measuring 1.2 cm within SUV max of 3.74. 3. Small, less than 1 cm, low right paratracheal and subcarinal lymph nodes exhibit mild FDG uptake just above blood pool activity. 4. Asymmetric FDG uptake localizes to the inferior pole of left lobe of thyroid gland. Cannot exclude FDG avid thyroid nodule. Recommend thyroid [place](ref: J Am Coll Radiol. 2015 Feb;12(2): 143-50). 5. Aortic Atherosclerosis (ICD10-I70.0) and Emphysema (ICD10-J43.9). Electronically  Signed   By: Kerby Moors M.D.   On: 09/03/2020 08:51   DG CHEST PORT 1 VIEW  Result Date: 09/01/2020 CLINICAL DATA:  Patient status post bronchoscopy.  Right hilar mass. EXAM: PORTABLE CHEST 1 VIEW COMPARISON:  CT chest 08/10/2020 FINDINGS: There is no pneumothorax after bronchoscopy. Right hilar mass lesion is again seen. The left lung is clear. No pleural fluid. Heart size is normal. IMPRESSION: Negative for pneumothorax after bronchoscopy. Right hilar mass as seen on prior exam. Electronically Signed   By: Inge Rise M.D.   On: 09/01/2020 15:32      IMPRESSION: Stage III squamous  cell carcinoma of right lung   The patient will be a good candidate for a definitive course of radiation therapy along with radiosensitizing chemotherapy.  Today, I talked to the patient and his fiance about the findings and work-up thus far.  We discussed the natural history of non-small cell lung cancer and general treatment, highlighting the role of radiotherapy in the management.  We discussed the available radiation techniques, and focused on the details of logistics and delivery.  We reviewed the anticipated acute and late sequelae associated with radiation in this setting.  The patient was encouraged to ask questions that I answered to the best of my ability.  A patient consent form was discussed and signed.  We retained a copy for our records.  The patient would like to proceed with radiation and will be scheduled for CT simulation.  PLAN: Patient will return tomorrow for CT simulation with treatments to begin May 25 concomitant with his radiosensitizing chemotherapy.  Anticipate 6 weeks of radiation therapy.   60 minutes of total time was spent for this patient encounter, including preparation, face-to-face counseling with the patient and coordination of care, physical exam, and documentation of the encounter.   ------------------------------------------------  Blair Promise, PhD, MD  This document serves as a record of services personally performed by Gery Pray, MD. It was created on his behalf by Roney Mans, a trained medical scribe. The creation of this record is based on the scribe's personal observations and the provider's statements to them. This document has been checked and approved by the attending provider.

## 2020-09-23 NOTE — Progress Notes (Signed)
See MD note for nursing evaluation. °

## 2020-09-23 NOTE — Telephone Encounter (Signed)
-----   Message from Sinda Du, RN sent at 09/22/2020 11:06 AM EDT ----- Regarding: Dr. Julien Nordmann - 1st chemo f/u 1st chemo f/u

## 2020-09-24 ENCOUNTER — Telehealth: Payer: Self-pay

## 2020-09-24 ENCOUNTER — Encounter: Payer: Self-pay | Admitting: General Practice

## 2020-09-24 ENCOUNTER — Other Ambulatory Visit: Payer: Self-pay | Admitting: Radiation Oncology

## 2020-09-24 ENCOUNTER — Other Ambulatory Visit: Payer: Self-pay

## 2020-09-24 ENCOUNTER — Ambulatory Visit
Admission: RE | Admit: 2020-09-24 | Discharge: 2020-09-24 | Disposition: A | Discharge status: Home / Self Care | Payer: Managed Care, Other (non HMO) | Source: Ambulatory Visit | Attending: Radiation Oncology | Admitting: Radiation Oncology

## 2020-09-24 DIAGNOSIS — Z510 Encounter for antineoplastic radiation therapy: Secondary | ICD-10-CM | POA: Diagnosis not present

## 2020-09-24 DIAGNOSIS — C3491 Malignant neoplasm of unspecified part of right bronchus or lung: Secondary | ICD-10-CM

## 2020-09-24 MED ORDER — PROCHLORPERAZINE MALEATE 10 MG PO TABS: 10.0000 mg | ORAL_TABLET | Freq: Four times a day (QID) | ORAL | 0 refills | Status: DC | PRN

## 2020-09-24 NOTE — Progress Notes (Signed)
Error

## 2020-09-24 NOTE — Telephone Encounter (Addendum)
Received call patient requesting refill on compazine. Last ordered on 5/3.

## 2020-09-25 ENCOUNTER — Encounter: Payer: Self-pay | Admitting: General Practice

## 2020-09-25 NOTE — Progress Notes (Signed)
[facility] Psychosocial Distress Screening Clinical Social Work  Clinical Social Work was referred by distress screening protocol.  The patient scored a 6 on the Psychosocial Distress Thermometer which indicates moderate distress. Clinical Social Worker contacted patient by phone to assess for distress and other psychosocial needs. Currently working, is Insurance account manager.  Plans to work as long as possible but his company does offer him short and long term disability.  He has good communication with his supervisor who is supportive of his needs.  Lives w fiancee who is supportive.  No financial or practical concerns.  Keeping a positive attitude.  Minimal impact from chemotherapy.  Starts radiation next week. Mentioned outside sources of support including Duanne [place], [place] and [facility].  He will contact us with any needs.    ONCBCN DISTRESS SCREENING 09/23/2020  Screening Type Initial Screening  Distress experienced in past week (1-10) 6  Practical problem type Food  Emotional problem type Adjusting to illness  Physical Problem type Pain;Nausea/vomiting;Sleep/insomnia;Tingling hands/feet  Physician notified of physical symptoms Yes  Referral to clinical social work Yes    Clinical Social Worker follow up needed: No.  If yes, follow up plan:  Beverely Pace, [place], LCSW Clinical Social Worker Phone:  [phone]

## 2020-09-28 ENCOUNTER — Encounter: Payer: Self-pay | Admitting: Internal Medicine

## 2020-09-28 ENCOUNTER — Other Ambulatory Visit: Payer: Self-pay

## 2020-09-28 ENCOUNTER — Inpatient Hospital Stay: Payer: Managed Care, Other (non HMO)

## 2020-09-28 VITALS — BP 133/84 | HR 83 | Temp 97.7°F | Resp 20 | Wt 195.8 lb

## 2020-09-28 DIAGNOSIS — Z510 Encounter for antineoplastic radiation therapy: Secondary | ICD-10-CM | POA: Diagnosis not present

## 2020-09-28 DIAGNOSIS — C3491 Malignant neoplasm of unspecified part of right bronchus or lung: Secondary | ICD-10-CM

## 2020-09-28 LAB — CMP (CANCER CENTER ONLY)
ALT: 21 U/L (ref 0–44)
AST: 15 U/L (ref 15–41)
Albumin: 3.5 g/dL (ref 3.5–5.0)
Alkaline Phosphatase: 82 U/L (ref 38–126)
Anion gap: 5 (ref 5–15)
BUN: 15 mg/dL (ref 6–20)
CO2: 26 mmol/L (ref 22–32)
Calcium: 9.4 mg/dL (ref 8.9–10.3)
Chloride: 105 mmol/L (ref 98–111)
Creatinine: 0.88 mg/dL (ref 0.61–1.24)
GFR, Estimated: >60 mL/min (ref >60)
Glucose, Bld: 102 mg/dL — ABNORMAL HIGH (ref 70–99)
Potassium: 4.4 mmol/L (ref 3.5–5.1)
Sodium: 136 mmol/L (ref 135–145)
Total Bilirubin: 0.8 mg/dL (ref 0.3–1.2)
Total Protein: 7.6 g/dL (ref 6.5–8.1)

## 2020-09-28 LAB — CBC WITH DIFFERENTIAL (CANCER CENTER ONLY)
Abs Immature Granulocytes: 0.03 10*3/uL (ref 0.00–0.07)
Basophils Absolute: 0 10*3/uL (ref 0.0–0.1)
Basophils Relative: 1 %
Eosinophils Absolute: 0.3 10*3/uL (ref 0.0–0.5)
Eosinophils Relative: 5 %
HCT: 42.5 % (ref 39.0–52.0)
Hemoglobin: 15 g/dL (ref 13.0–17.0)
Immature Granulocytes: 1 %
Lymphocytes Relative: 25 %
Lymphs Abs: 1.5 10*3/uL (ref 0.7–4.0)
MCH: 29.6 pg (ref 26.0–34.0)
MCHC: 35.3 g/dL (ref 30.0–36.0)
MCV: 83.8 fL (ref 80.0–100.0)
Monocytes Absolute: 0.6 10*3/uL (ref 0.1–1.0)
Monocytes Relative: 9 %
Neutro Abs: 3.6 10*3/uL (ref 1.7–7.7)
Neutrophils Relative %: 59 %
Platelet Count: 235 10*3/uL (ref 150–400)
RBC: 5.07 MIL/uL (ref 4.22–5.81)
RDW: 12.3 % (ref 11.5–15.5)
WBC Count: 5.9 10*3/uL (ref 4.0–10.5)
nRBC: 0 % (ref 0.0–0.2)

## 2020-09-28 MED ORDER — SODIUM CHLORIDE 0.9 % IV SOLN
45.0000 mg/m2 | Freq: Once | INTRAVENOUS | Status: AC
  Administered 2020-09-28: 90 mg via INTRAVENOUS
  Filled 2020-09-28: qty 15

## 2020-09-28 MED ORDER — PALONOSETRON HCL INJECTION 0.25 MG/5ML
INTRAVENOUS | Status: AC
  Filled 2020-09-28: qty 5

## 2020-09-28 MED ORDER — FAMOTIDINE 20 MG IN NS 100 ML IVPB
INTRAVENOUS | Status: AC
  Filled 2020-09-28: qty 100

## 2020-09-28 MED ORDER — DIPHENHYDRAMINE HCL 50 MG/ML IJ SOLN
50.0000 mg | Freq: Once | INTRAMUSCULAR | Status: AC
  Administered 2020-09-28: 50 mg via INTRAVENOUS

## 2020-09-28 MED ORDER — SODIUM CHLORIDE 0.9 % IV SOLN
287.2000 mg | Freq: Once | INTRAVENOUS | Status: AC
  Administered 2020-09-28: 290 mg via INTRAVENOUS
  Filled 2020-09-28: qty 29

## 2020-09-28 MED ORDER — DIPHENHYDRAMINE HCL 50 MG/ML IJ SOLN
INTRAMUSCULAR | Status: AC
  Filled 2020-09-28: qty 1

## 2020-09-28 MED ORDER — SODIUM CHLORIDE 0.9 % IV SOLN
Freq: Once | INTRAVENOUS | Status: AC
Start: 2020-09-28 — End: 2020-09-28
  Filled 2020-09-28: qty 250

## 2020-09-28 MED ORDER — PALONOSETRON HCL INJECTION 0.25 MG/5ML
0.2500 mg | Freq: Once | INTRAVENOUS | Status: AC
  Administered 2020-09-28: 0.25 mg via INTRAVENOUS

## 2020-09-28 MED ORDER — FAMOTIDINE 20 MG IN NS 100 ML IVPB
20.0000 mg | Freq: Once | INTRAVENOUS | Status: AC
  Administered 2020-09-28: 20 mg via INTRAVENOUS

## 2020-09-28 MED ORDER — SODIUM CHLORIDE 0.9 % IV SOLN
20.0000 mg | Freq: Once | INTRAVENOUS | Status: AC
  Administered 2020-09-28: 20 mg via INTRAVENOUS
  Filled 2020-09-28: qty 20

## 2020-09-28 NOTE — Patient Instructions (Signed)
[facility]  Discharge Instructions: Thank you for choosing [facility] to provide your oncology and hematology care.   If you have a lab appointment with the Cancer Center, please go directly to the Cancer Center and check in at the registration area.   Wear comfortable clothing and clothing appropriate for easy access to any Portacath or PICC line.   We strive to give you quality time with your provider. You may need to reschedule your appointment if you arrive late (15 or more minutes).  Arriving late affects you and other patients whose appointments are after yours.  Also, if you miss three or more appointments without notifying the office, you may be dismissed from the clinic at the provider's discretion.      For prescription refill requests, have your pharmacy contact our office and allow 72 hours for refills to be completed.    Today you received the following chemotherapy and/or immunotherapy agents: Paclitaxel (Taxol) and Carboplatin.   To help prevent nausea and vomiting after your treatment, we encourage you to take your nausea medication as directed.  BELOW ARE SYMPTOMS THAT SHOULD BE REPORTED IMMEDIATELY: *FEVER GREATER THAN 100.4 F (38 C) OR HIGHER *CHILLS OR SWEATING *NAUSEA AND VOMITING THAT IS NOT CONTROLLED WITH YOUR NAUSEA MEDICATION *UNUSUAL SHORTNESS OF BREATH *UNUSUAL BRUISING OR BLEEDING *URINARY PROBLEMS (pain or burning when urinating, or frequent urination) *BOWEL PROBLEMS (unusual diarrhea, constipation, pain near the anus) TENDERNESS IN MOUTH AND THROAT WITH OR WITHOUT PRESENCE OF ULCERS (sore throat, sores in mouth, or a toothache) UNUSUAL RASH, SWELLING OR PAIN  UNUSUAL VAGINAL DISCHARGE OR ITCHING   Items with * indicate a potential emergency and should be followed up as soon as possible or go to the Emergency Department if any problems should occur.  Please show the CHEMOTHERAPY ALERT CARD or IMMUNOTHERAPY  ALERT CARD at check-in to the Emergency Department and triage nurse.  Should you have questions after your visit or need to cancel or reschedule your appointment, please contact [facility]  Dept: [phone]  and follow the prompts.  Office hours are 8:00 a.m. to 4:30 p.m. Monday - Friday. Please note that voicemails left after 4:00 p.m. may not be returned until the following business day.  We are closed weekends and major holidays. You have access to a nurse at all times for urgent questions. Please call the main number to the clinic Dept: [phone] and follow the prompts.   For any non-urgent questions, you may also contact your provider using MyChart. We now offer e-Visits for anyone 18 and older to request care online for non-urgent symptoms. For details visit [email or web address].   Also download the MyChart app! Go to the app store, search "MyChart", open the app, select [facility], and log in with your MyChart username and password.  Due to Covid, a mask is required upon entering the hospital/clinic. If you do not have a mask, one will be given to you upon arrival. For doctor visits, patients may have 1 support person aged 18 or older with them. For treatment visits, patients cannot have anyone with them due to current Covid guidelines and our immunocompromised population.   

## 2020-09-29 DIAGNOSIS — Z510 Encounter for antineoplastic radiation therapy: Secondary | ICD-10-CM | POA: Diagnosis not present

## 2020-09-30 ENCOUNTER — Ambulatory Visit
Admission: RE | Admit: 2020-09-30 | Discharge: 2020-09-30 | Disposition: A | Discharge status: Home / Self Care | Payer: Managed Care, Other (non HMO) | Source: Ambulatory Visit | Attending: Radiation Oncology | Admitting: Radiation Oncology

## 2020-09-30 ENCOUNTER — Other Ambulatory Visit: Payer: Self-pay

## 2020-09-30 DIAGNOSIS — Z510 Encounter for antineoplastic radiation therapy: Secondary | ICD-10-CM | POA: Diagnosis not present

## 2020-09-30 DIAGNOSIS — C3491 Malignant neoplasm of unspecified part of right bronchus or lung: Secondary | ICD-10-CM

## 2020-10-01 ENCOUNTER — Ambulatory Visit
Admission: RE | Admit: 2020-10-01 | Discharge: 2020-10-01 | Disposition: A | Discharge status: Home / Self Care | Payer: Managed Care, Other (non HMO) | Source: Ambulatory Visit | Attending: Radiation Oncology | Admitting: Radiation Oncology

## 2020-10-01 DIAGNOSIS — Z510 Encounter for antineoplastic radiation therapy: Secondary | ICD-10-CM | POA: Diagnosis not present

## 2020-10-02 ENCOUNTER — Ambulatory Visit
Admission: RE | Admit: 2020-10-02 | Discharge: 2020-10-02 | Disposition: A | Discharge status: Home / Self Care | Payer: Managed Care, Other (non HMO) | Source: Ambulatory Visit | Attending: Radiation Oncology | Admitting: Radiation Oncology

## 2020-10-02 ENCOUNTER — Other Ambulatory Visit: Payer: Self-pay

## 2020-10-02 DIAGNOSIS — Z510 Encounter for antineoplastic radiation therapy: Secondary | ICD-10-CM | POA: Diagnosis not present

## 2020-10-06 ENCOUNTER — Other Ambulatory Visit: Payer: Self-pay

## 2020-10-06 ENCOUNTER — Inpatient Hospital Stay (HOSPITAL_BASED_OUTPATIENT_CLINIC_OR_DEPARTMENT_OTHER): Payer: Managed Care, Other (non HMO) | Admitting: Internal Medicine

## 2020-10-06 ENCOUNTER — Ambulatory Visit
Admission: RE | Admit: 2020-10-06 | Discharge: 2020-10-06 | Disposition: A | Discharge status: Home / Self Care | Payer: Managed Care, Other (non HMO) | Source: Ambulatory Visit | Attending: Radiation Oncology | Admitting: Radiation Oncology

## 2020-10-06 ENCOUNTER — Inpatient Hospital Stay: Payer: Managed Care, Other (non HMO)

## 2020-10-06 VITALS — BP 140/83 | HR 84 | Temp 97.7°F | Resp 18 | Ht 68.5 in | Wt 187.5 lb

## 2020-10-06 DIAGNOSIS — Z5111 Encounter for antineoplastic chemotherapy: Secondary | ICD-10-CM

## 2020-10-06 DIAGNOSIS — C3491 Malignant neoplasm of unspecified part of right bronchus or lung: Secondary | ICD-10-CM

## 2020-10-06 DIAGNOSIS — C3411 Malignant neoplasm of upper lobe, right bronchus or lung: Secondary | ICD-10-CM | POA: Diagnosis not present

## 2020-10-06 DIAGNOSIS — Z87891 Personal history of nicotine dependence: Secondary | ICD-10-CM

## 2020-10-06 DIAGNOSIS — E039 Hypothyroidism, unspecified: Secondary | ICD-10-CM | POA: Diagnosis not present

## 2020-10-06 DIAGNOSIS — Z510 Encounter for antineoplastic radiation therapy: Secondary | ICD-10-CM | POA: Diagnosis not present

## 2020-10-06 LAB — CMP (CANCER CENTER ONLY)
ALT: 23 U/L (ref 0–44)
AST: 17 U/L (ref 15–41)
Albumin: 3.3 g/dL — ABNORMAL LOW (ref 3.5–5.0)
Alkaline Phosphatase: 86 U/L (ref 38–126)
Anion gap: 12 (ref 5–15)
BUN: 11 mg/dL (ref 6–20)
CO2: 23 mmol/L (ref 22–32)
Calcium: 9.4 mg/dL (ref 8.9–10.3)
Chloride: 103 mmol/L (ref 98–111)
Creatinine: 0.78 mg/dL (ref 0.61–1.24)
GFR, Estimated: >60 mL/min (ref >60)
Glucose, Bld: 105 mg/dL — ABNORMAL HIGH (ref 70–99)
Potassium: 4.4 mmol/L (ref 3.5–5.1)
Sodium: 138 mmol/L (ref 135–145)
Total Bilirubin: 0.4 mg/dL (ref 0.3–1.2)
Total Protein: 7.3 g/dL (ref 6.5–8.1)

## 2020-10-06 LAB — CBC WITH DIFFERENTIAL (CANCER CENTER ONLY)
Abs Immature Granulocytes: 0.02 10*3/uL (ref 0.00–0.07)
Basophils Absolute: 0 10*3/uL (ref 0.0–0.1)
Basophils Relative: 1 %
Eosinophils Absolute: 0.2 10*3/uL (ref 0.0–0.5)
Eosinophils Relative: 3 %
HCT: 39.5 % (ref 39.0–52.0)
Hemoglobin: 13.5 g/dL (ref 13.0–17.0)
Immature Granulocytes: 0 %
Lymphocytes Relative: 16 %
Lymphs Abs: 0.8 10*3/uL (ref 0.7–4.0)
MCH: 28.8 pg (ref 26.0–34.0)
MCHC: 34.2 g/dL (ref 30.0–36.0)
MCV: 84.4 fL (ref 80.0–100.0)
Monocytes Absolute: 0.8 10*3/uL (ref 0.1–1.0)
Monocytes Relative: 15 %
Neutro Abs: 3.2 10*3/uL (ref 1.7–7.7)
Neutrophils Relative %: 65 %
Platelet Count: 248 10*3/uL (ref 150–400)
RBC: 4.68 MIL/uL (ref 4.22–5.81)
RDW: 12.9 % (ref 11.5–15.5)
WBC Count: 5 10*3/uL (ref 4.0–10.5)
nRBC: 0 % (ref 0.0–0.2)

## 2020-10-06 MED ORDER — FAMOTIDINE 20 MG IN NS 100 ML IVPB
20.0000 mg | Freq: Once | INTRAVENOUS | Status: AC
  Administered 2020-10-06: 20 mg via INTRAVENOUS
  Filled 2020-10-06: qty 100

## 2020-10-06 MED ORDER — SODIUM CHLORIDE 0.9 % IV SOLN
45.0000 mg/m2 | Freq: Once | INTRAVENOUS | Status: AC
  Administered 2020-10-06: 90 mg via INTRAVENOUS
  Filled 2020-10-06: qty 15

## 2020-10-06 MED ORDER — DIPHENHYDRAMINE HCL 50 MG/ML IJ SOLN
50.0000 mg | Freq: Once | INTRAMUSCULAR | Status: AC
Start: 2020-10-06 — End: 2020-10-06
  Administered 2020-10-06: 50 mg via INTRAVENOUS

## 2020-10-06 MED ORDER — PALONOSETRON HCL INJECTION 0.25 MG/5ML
0.2500 mg | Freq: Once | INTRAVENOUS | Status: AC
  Administered 2020-10-06: 0.25 mg via INTRAVENOUS

## 2020-10-06 MED ORDER — SODIUM CHLORIDE 0.9 % IV SOLN
Freq: Once | INTRAVENOUS | Status: AC
  Filled 2020-10-06: qty 250

## 2020-10-06 MED ORDER — DIPHENHYDRAMINE HCL 50 MG/ML IJ SOLN
INTRAMUSCULAR | Status: AC
  Filled 2020-10-06: qty 1

## 2020-10-06 MED ORDER — SODIUM CHLORIDE 0.9 % IV SOLN
20.0000 mg | Freq: Once | INTRAVENOUS | Status: AC
  Administered 2020-10-06: 20 mg via INTRAVENOUS
  Filled 2020-10-06: qty 20

## 2020-10-06 MED ORDER — PALONOSETRON HCL INJECTION 0.25 MG/5ML
INTRAVENOUS | Status: AC
  Filled 2020-10-06: qty 5

## 2020-10-06 MED ORDER — SODIUM CHLORIDE 0.9 % IV SOLN
290.0000 mg | Freq: Once | INTRAVENOUS | Status: AC
  Administered 2020-10-06: 290 mg via INTRAVENOUS
  Filled 2020-10-06: qty 29

## 2020-10-06 MED ORDER — FAMOTIDINE 20 MG IN NS 100 ML IVPB
INTRAVENOUS | Status: AC
  Filled 2020-10-06: qty 100

## 2020-10-06 NOTE — Progress Notes (Signed)
[facility] [phone]   Fax:([phone]  OFFICE PROGRESS NOTE  [name], MD [address] [place] [address]  DIAGNOSIS: Stage IIIb (T3, N2, M0) non-small cell lung cancer, squamous cell carcinoma presented with large right upper lobe perihilar mass with right hilar and subcarinal lymphadenopathy diagnosed in April 2022.  PRIOR THERAPY: None  CURRENT THERAPY: Concurrent chemoradiation with weekly carboplatin for AUC of 2 and paclitaxel 45 Mg/M2.  Status post 2 cycles.  INTERVAL HISTORY: Paul Mclean 60 y.o. male returns to the clinic today for follow-up visit.  The patient is feeling fine today with no concerning complaints.  He tolerated the first 2 doses of his concurrent chemoradiation fairly well.  He denied having any current chest pain, shortness of breath, cough or hemoptysis.  He denied having any fever or chills.  He has no nausea, vomiting, diarrhea or constipation.  He denied having any headache or visual changes.  He is here today for evaluation before starting cycle #3.  MEDICAL HISTORY: Past Medical History:  Diagnosis Date  . Hypothyroidism   . Thyroid disease     ALLERGIES:  has No Known Allergies.  MEDICATIONS:  Current Outpatient Medications  Medication Sig Dispense Refill  . levothyroxine (SYNTHROID) 112 MCG tablet Take 112 mcg by mouth in the morning.    . prochlorperazine (COMPAZINE) 10 MG tablet Take 1 tablet (10 mg total) by mouth every 6 (six) hours as needed for nausea or vomiting. 30 tablet 0  . umeclidinium-vilanterol (ANORO ELLIPTA) 62.5-25 MCG/INH AEPB Inhale 1 puff into the lungs daily. 60 each 6   No current facility-administered medications for this visit.   Facility-Administered Medications Ordered in Other Visits  Medication Dose Route Frequency Provider Last Rate Last Admin  . CARBOplatin (PARAPLATIN) 290 mg in sodium chloride 0.9 % 250 mL chemo infusion  290 mg Intravenous Once Curt Bears, MD      . famotidine (PEPCID) IVPB 20 mg in NS 100 mL IVPB  20 mg Intravenous Once Curt Bears, MD 400 mL/hr at 10/06/20 0914 20 mg at 10/06/20 0914  . PACLitaxel (TAXOL) 90 mg in sodium chloride 0.9 % 250 mL chemo infusion (</= 80mg /m2)  45 mg/m2 (Treatment Plan Recorded) Intravenous Once Curt Bears, MD        SURGICAL HISTORY:  Past Surgical History:  Procedure Laterality Date  . APPENDECTOMY    . BRONCHIAL BIOPSY  09/01/2020   Procedure: BRONCHIAL BIOPSIES;  Surgeon: Garner Nash, DO;  Location: [facility];  Service: Pulmonary;;  . BRONCHIAL BRUSHINGS  09/01/2020   Procedure: BRONCHIAL BRUSHINGS;  Surgeon: Garner Nash, DO;  Location: [facility];  Service: Pulmonary;;  . BRONCHIAL NEEDLE ASPIRATION BIOPSY  09/01/2020   Procedure: BRONCHIAL NEEDLE ASPIRATION BIOPSIES;  Surgeon: Garner Nash, DO;  Location: [facility];  Service: Pulmonary;;  . BRONCHIAL WASHINGS  09/01/2020   Procedure: BRONCHIAL WASHINGS;  Surgeon: Garner Nash, DO;  Location: [facility];  Service: Pulmonary;;  . HERNIA REPAIR    . INCISION AND DRAINAGE ABSCESS N/A 08/25/2017   Procedure: INCISION AND DRAINAGE perineal  ABSCESS;  Surgeon: Festus Aloe, MD;  Location: [facility];  Service: Urology;  Laterality: N/A;  . VIDEO BRONCHOSCOPY WITH ENDOBRONCHIAL ULTRASOUND N/A 09/01/2020   Procedure: VIDEO BRONCHOSCOPY WITH ENDOBRONCHIAL ULTRASOUND;  Surgeon: Garner Nash, DO;  Location: [facility];  Service: Pulmonary;  Laterality: N/A;    REVIEW OF SYSTEMS:  A comprehensive review of systems was negative.  PHYSICAL EXAMINATION: General appearance: alert, cooperative and no distress Head: Normocephalic, without obvious abnormality, atraumatic Neck: no adenopathy, no JVD, supple, symmetrical, trachea midline and thyroid not enlarged, symmetric, no tenderness/mass/nodules Lymph nodes: Cervical, supraclavicular, and axillary nodes normal. Resp: clear to auscultation  bilaterally Back: symmetric, no curvature. ROM normal. No CVA tenderness. Cardio: regular rate and rhythm, S1, S2 normal, no murmur, click, rub or gallop GI: soft, non-tender; bowel sounds normal; no masses,  no organomegaly Extremities: extremities normal, atraumatic, no cyanosis or edema  ECOG PERFORMANCE STATUS: 1 - Symptomatic but completely ambulatory  There were no vitals taken for this visit.  LABORATORY DATA: Lab Results  Component Value Date   WBC 5.0 10/06/2020   HGB 13.5 10/06/2020   HCT 39.5 10/06/2020   MCV 84.4 10/06/2020   PLT 248 10/06/2020      Chemistry      Component Value Date/Time   NA 138 10/06/2020 0721   K 4.4 10/06/2020 0721   CL 103 10/06/2020 0721   CO2 23 10/06/2020 0721   BUN 11 10/06/2020 0721   CREATININE 0.78 10/06/2020 0721      Component Value Date/Time   CALCIUM 9.4 10/06/2020 0721   ALKPHOS 86 10/06/2020 0721   AST 17 10/06/2020 0721   ALT 23 10/06/2020 0721   BILITOT 0.4 10/06/2020 0721       RADIOGRAPHIC STUDIES: MR BRAIN W WO CONTRAST  Result Date: 09/17/2020 CLINICAL DATA:  Lung cancer, staging EXAM: MRI HEAD WITHOUT AND WITH CONTRAST TECHNIQUE: Multiplanar, multiecho pulse sequences of the brain and surrounding structures were obtained without and with intravenous contrast. CONTRAST:  67mL GADAVIST GADOBUTROL 1 MMOL/ML IV SOLN COMPARISON:  None. FINDINGS: Brain: There is no acute infarction or intracranial hemorrhage. There is no intracranial mass, mass effect, or edema. There is no hydrocephalus or extra-axial fluid collection. Ventricles and sulci are within normal limits in size and configuration. A few small foci of T2 hyperintensity in the supratentorial white matter are nonspecific but may reflect minor chronic microvascular ischemic changes. No abnormal enhancement. Vascular: Major vessel flow voids at the skull base are preserved. Skull and upper cervical spine: Normal marrow signal is preserved. Sinuses/Orbits: Minor mucosal  thickening.  Orbits are unremarkable. Other: Susceptibility artifact along the left anterior scalp of unknown origin. Sella is unremarkable. Mastoid air cells are clear. IMPRESSION: No evidence of intracranial metastatic disease. Electronically Signed   By: Macy Mis M.D.   On: 09/17/2020 12:29    ASSESSMENT AND PLAN: This is a very pleasant 60 years old white male recently diagnosed with stage IIIb (T3, N2, M0) non-small cell lung cancer, squamous cell carcinoma presented with large right upper lobe perihilar mass with right hilar and subcarinal lymphadenopathy diagnosed in April 2022. The patient is currently undergoing a course of concurrent chemoradiation with weekly carboplatin and paclitaxel status post 2 cycles.  He tolerated the first 2 weeks of his treatment fairly well with no concerning adverse effects. I recommended for him to proceed with cycle #3 today as planned. I will see him back for follow-up visit in 2 weeks for evaluation before starting cycle #5. The patient was advised to call immediately if he has any concerning symptoms in the interval. The patient voices understanding of current disease status and treatment options and is in agreement with the current care plan.  All questions were answered. The patient knows to call the clinic with any problems, questions or concerns. We can certainly see the patient much sooner if necessary.  The total time spent in the appointment was 20 minutes.  Disclaimer: This note was dictated with voice recognition software. Similar sounding words can inadvertently be transcribed and may not be corrected upon review.

## 2020-10-06 NOTE — Patient Instructions (Addendum)
[facility]  Discharge Instructions: Thank you for choosing [facility] to provide your oncology and hematology care.   If you have a lab appointment with the Cancer Center, please go directly to the Cancer Center and check in at the registration area.   Wear comfortable clothing and clothing appropriate for easy access to any Portacath or PICC line.   We strive to give you quality time with your provider. You may need to reschedule your appointment if you arrive late (15 or more minutes).  Arriving late affects you and other patients whose appointments are after yours.  Also, if you miss three or more appointments without notifying the office, you may be dismissed from the clinic at the provider's discretion.      For prescription refill requests, have your pharmacy contact our office and allow 72 hours for refills to be completed.    Today you received the following chemotherapy and/or immunotherapy agents: Paclitaxel (Taxol) and Carboplatin.   To help prevent nausea and vomiting after your treatment, we encourage you to take your nausea medication as directed.  BELOW ARE SYMPTOMS THAT SHOULD BE REPORTED IMMEDIATELY: *FEVER GREATER THAN 100.4 F (38 C) OR HIGHER *CHILLS OR SWEATING *NAUSEA AND VOMITING THAT IS NOT CONTROLLED WITH YOUR NAUSEA MEDICATION *UNUSUAL SHORTNESS OF BREATH *UNUSUAL BRUISING OR BLEEDING *URINARY PROBLEMS (pain or burning when urinating, or frequent urination) *BOWEL PROBLEMS (unusual diarrhea, constipation, pain near the anus) TENDERNESS IN MOUTH AND THROAT WITH OR WITHOUT PRESENCE OF ULCERS (sore throat, sores in mouth, or a toothache) UNUSUAL RASH, SWELLING OR PAIN  UNUSUAL VAGINAL DISCHARGE OR ITCHING   Items with * indicate a potential emergency and should be followed up as soon as possible or go to the Emergency Department if any problems should occur.  Please show the CHEMOTHERAPY ALERT CARD or IMMUNOTHERAPY  ALERT CARD at check-in to the Emergency Department and triage nurse.  Should you have questions after your visit or need to cancel or reschedule your appointment, please contact [facility]  Dept: [phone]  and follow the prompts.  Office hours are 8:00 a.m. to 4:30 p.m. Monday - Friday. Please note that voicemails left after 4:00 p.m. may not be returned until the following business day.  We are closed weekends and major holidays. You have access to a nurse at all times for urgent questions. Please call the main number to the clinic Dept: [phone] and follow the prompts.   For any non-urgent questions, you may also contact your provider using MyChart. We now offer e-Visits for anyone 18 and older to request care online for non-urgent symptoms. For details visit [email or web address].   Also download the MyChart app! Go to the app store, search "MyChart", open the app, select , and log in with your MyChart username and password.  Due to Covid, a mask is required upon entering the hospital/clinic. If you do not have a mask, one will be given to you upon arrival. For doctor visits, patients may have 1 support person aged 18 or older with them. For treatment visits, patients cannot have anyone with them due to current Covid guidelines and our immunocompromised population.   

## 2020-10-07 ENCOUNTER — Ambulatory Visit
Admission: RE | Admit: 2020-10-07 | Discharge: 2020-10-07 | Disposition: A | Discharge status: Home / Self Care | Payer: Managed Care, Other (non HMO) | Source: Ambulatory Visit | Attending: Radiation Oncology | Admitting: Radiation Oncology

## 2020-10-07 DIAGNOSIS — C3491 Malignant neoplasm of unspecified part of right bronchus or lung: Secondary | ICD-10-CM

## 2020-10-07 DIAGNOSIS — Z87891 Personal history of nicotine dependence: Secondary | ICD-10-CM | POA: Diagnosis not present

## 2020-10-07 DIAGNOSIS — Z5111 Encounter for antineoplastic chemotherapy: Secondary | ICD-10-CM | POA: Diagnosis present

## 2020-10-07 DIAGNOSIS — G4700 Insomnia, unspecified: Secondary | ICD-10-CM | POA: Diagnosis not present

## 2020-10-07 DIAGNOSIS — R110 Nausea: Secondary | ICD-10-CM | POA: Diagnosis not present

## 2020-10-07 DIAGNOSIS — Z510 Encounter for antineoplastic radiation therapy: Secondary | ICD-10-CM | POA: Diagnosis present

## 2020-10-07 DIAGNOSIS — Z79899 Other long term (current) drug therapy: Secondary | ICD-10-CM | POA: Diagnosis not present

## 2020-10-07 MED ORDER — SONAFINE EX EMUL
1.0000 | Freq: Once | CUTANEOUS | Status: AC
Start: 2020-10-07 — End: 2020-10-07
  Administered 2020-10-07: 1 via TOPICAL

## 2020-10-07 NOTE — Progress Notes (Signed)
Pt here for patient teaching.   Pt given Radiation and You booklet, skin care instructions and Sonafine.   Reviewed areas of pertinence such as fatigue, hair loss, skin changes, throat changes, cough and shortness of breath .  Pt able to give teach back of to pat skin and use unscented/gentle soap,apply Sonafine bid and avoid applying anything to skin within 4 hours of treatment.  Pt demonstrated understanding of information given and will contact nursing with any questions or concerns.

## 2020-10-08 ENCOUNTER — Other Ambulatory Visit: Payer: Self-pay

## 2020-10-08 ENCOUNTER — Ambulatory Visit
Admission: RE | Admit: 2020-10-08 | Discharge: 2020-10-08 | Disposition: A | Discharge status: Home / Self Care | Payer: Managed Care, Other (non HMO) | Source: Ambulatory Visit | Attending: Radiation Oncology | Admitting: Radiation Oncology

## 2020-10-08 DIAGNOSIS — Z5111 Encounter for antineoplastic chemotherapy: Secondary | ICD-10-CM | POA: Diagnosis not present

## 2020-10-09 ENCOUNTER — Ambulatory Visit
Admission: RE | Admit: 2020-10-09 | Discharge: 2020-10-09 | Disposition: A | Discharge status: Home / Self Care | Payer: Managed Care, Other (non HMO) | Source: Ambulatory Visit | Attending: Radiation Oncology | Admitting: Radiation Oncology

## 2020-10-09 DIAGNOSIS — Z5111 Encounter for antineoplastic chemotherapy: Secondary | ICD-10-CM | POA: Diagnosis not present

## 2020-10-12 ENCOUNTER — Other Ambulatory Visit: Payer: Self-pay

## 2020-10-12 ENCOUNTER — Ambulatory Visit
Admission: RE | Admit: 2020-10-12 | Discharge: 2020-10-12 | Disposition: A | Discharge status: Home / Self Care | Payer: Managed Care, Other (non HMO) | Source: Ambulatory Visit | Attending: Radiation Oncology | Admitting: Radiation Oncology

## 2020-10-12 ENCOUNTER — Inpatient Hospital Stay: Payer: Managed Care, Other (non HMO) | Attending: Internal Medicine

## 2020-10-12 ENCOUNTER — Inpatient Hospital Stay: Payer: Managed Care, Other (non HMO)

## 2020-10-12 VITALS — BP 143/97 | HR 80 | Temp 98.2°F | Resp 18 | Wt 199.2 lb

## 2020-10-12 DIAGNOSIS — R110 Nausea: Secondary | ICD-10-CM | POA: Insufficient documentation

## 2020-10-12 DIAGNOSIS — Z5111 Encounter for antineoplastic chemotherapy: Secondary | ICD-10-CM | POA: Insufficient documentation

## 2020-10-12 DIAGNOSIS — C3491 Malignant neoplasm of unspecified part of right bronchus or lung: Secondary | ICD-10-CM | POA: Insufficient documentation

## 2020-10-12 DIAGNOSIS — G4700 Insomnia, unspecified: Secondary | ICD-10-CM | POA: Insufficient documentation

## 2020-10-12 DIAGNOSIS — Z510 Encounter for antineoplastic radiation therapy: Secondary | ICD-10-CM | POA: Insufficient documentation

## 2020-10-12 DIAGNOSIS — Z87891 Personal history of nicotine dependence: Secondary | ICD-10-CM | POA: Insufficient documentation

## 2020-10-12 DIAGNOSIS — Z79899 Other long term (current) drug therapy: Secondary | ICD-10-CM | POA: Insufficient documentation

## 2020-10-12 LAB — CBC WITH DIFFERENTIAL (CANCER CENTER ONLY)
Abs Immature Granulocytes: 0.06 10*3/uL (ref 0.00–0.07)
Basophils Absolute: 0 10*3/uL (ref 0.0–0.1)
Basophils Relative: 1 %
Eosinophils Absolute: 0.1 10*3/uL (ref 0.0–0.5)
Eosinophils Relative: 2 %
HCT: 39.4 % (ref 39.0–52.0)
Hemoglobin: 13.7 g/dL (ref 13.0–17.0)
Immature Granulocytes: 1 %
Lymphocytes Relative: 17 %
Lymphs Abs: 0.7 10*3/uL (ref 0.7–4.0)
MCH: 29.4 pg (ref 26.0–34.0)
MCHC: 34.8 g/dL (ref 30.0–36.0)
MCV: 84.5 fL (ref 80.0–100.0)
Monocytes Absolute: 0.5 10*3/uL (ref 0.1–1.0)
Monocytes Relative: 10 %
Neutro Abs: 3.1 10*3/uL (ref 1.7–7.7)
Neutrophils Relative %: 69 %
Platelet Count: 203 10*3/uL (ref 150–400)
RBC: 4.66 MIL/uL (ref 4.22–5.81)
RDW: 12.8 % (ref 11.5–15.5)
WBC Count: 4.5 10*3/uL (ref 4.0–10.5)
nRBC: 0 % (ref 0.0–0.2)

## 2020-10-12 LAB — CMP (CANCER CENTER ONLY)
ALT: 23 U/L (ref 0–44)
AST: 13 U/L — ABNORMAL LOW (ref 15–41)
Albumin: 3.3 g/dL — ABNORMAL LOW (ref 3.5–5.0)
Alkaline Phosphatase: 85 U/L (ref 38–126)
Anion gap: 11 (ref 5–15)
BUN: 14 mg/dL (ref 6–20)
CO2: 23 mmol/L (ref 22–32)
Calcium: 9.5 mg/dL (ref 8.9–10.3)
Chloride: 103 mmol/L (ref 98–111)
Creatinine: 0.78 mg/dL (ref 0.61–1.24)
GFR, Estimated: >60 mL/min (ref >60)
Glucose, Bld: 115 mg/dL — ABNORMAL HIGH (ref 70–99)
Potassium: 4.3 mmol/L (ref 3.5–5.1)
Sodium: 137 mmol/L (ref 135–145)
Total Bilirubin: 0.5 mg/dL (ref 0.3–1.2)
Total Protein: 7.5 g/dL (ref 6.5–8.1)

## 2020-10-12 MED ORDER — FAMOTIDINE 20 MG IN NS 100 ML IVPB
INTRAVENOUS | Status: AC
  Filled 2020-10-12: qty 100

## 2020-10-12 MED ORDER — DIPHENHYDRAMINE HCL 50 MG/ML IJ SOLN
INTRAMUSCULAR | Status: AC
  Filled 2020-10-12: qty 1

## 2020-10-12 MED ORDER — PALONOSETRON HCL INJECTION 0.25 MG/5ML
INTRAVENOUS | Status: AC
  Filled 2020-10-12: qty 5

## 2020-10-12 MED ORDER — SODIUM CHLORIDE 0.9 % IV SOLN
Freq: Once | INTRAVENOUS | Status: AC
  Filled 2020-10-12: qty 250

## 2020-10-12 MED ORDER — DIPHENHYDRAMINE HCL 50 MG/ML IJ SOLN
50.0000 mg | Freq: Once | INTRAMUSCULAR | Status: AC
Start: 2020-10-12 — End: 2020-10-12
  Administered 2020-10-12: 50 mg via INTRAVENOUS

## 2020-10-12 MED ORDER — SODIUM CHLORIDE 0.9 % IV SOLN
45.0000 mg/m2 | Freq: Once | INTRAVENOUS | Status: AC
  Administered 2020-10-12: 90 mg via INTRAVENOUS
  Filled 2020-10-12: qty 15

## 2020-10-12 MED ORDER — PALONOSETRON HCL INJECTION 0.25 MG/5ML
0.2500 mg | Freq: Once | INTRAVENOUS | Status: AC
Start: 2020-10-12 — End: 2020-10-12
  Administered 2020-10-12: 0.25 mg via INTRAVENOUS

## 2020-10-12 MED ORDER — FAMOTIDINE 20 MG IN NS 100 ML IVPB
20.0000 mg | Freq: Once | INTRAVENOUS | Status: AC
  Administered 2020-10-12: 20 mg via INTRAVENOUS

## 2020-10-12 MED ORDER — SODIUM CHLORIDE 0.9 % IV SOLN
287.2000 mg | Freq: Once | INTRAVENOUS | Status: AC
  Administered 2020-10-12: 290 mg via INTRAVENOUS
  Filled 2020-10-12: qty 29

## 2020-10-12 MED ORDER — SODIUM CHLORIDE 0.9 % IV SOLN
20.0000 mg | Freq: Once | INTRAVENOUS | Status: AC
  Administered 2020-10-12: 20 mg via INTRAVENOUS
  Filled 2020-10-12: qty 20

## 2020-10-12 NOTE — Patient Instructions (Signed)
[facility]  Discharge Instructions: Thank you for choosing [facility] to provide your oncology and hematology care.   If you have a lab appointment with the [facility], please go directly to the [facility] and check in at the registration area.   Wear comfortable clothing and clothing appropriate for easy access to any Portacath or PICC line.   We strive to give you quality time with your provider. You may need to reschedule your appointment if you arrive late (15 or more minutes).  Arriving late affects you and other patients whose appointments are after yours.  Also, if you miss three or more appointments without notifying the office, you may be dismissed from the clinic at the provider's discretion.      For prescription refill requests, have your pharmacy contact our office and allow 72 hours for refills to be completed.    Today you received the following chemotherapy and/or immunotherapy agents: Taxol/Carbo.     To help prevent nausea and vomiting after your treatment, we encourage you to take your nausea medication as directed.  BELOW ARE SYMPTOMS THAT SHOULD BE REPORTED IMMEDIATELY: . *FEVER GREATER THAN 100.4 F (38 C) OR HIGHER . *CHILLS OR SWEATING . *NAUSEA AND VOMITING THAT IS NOT CONTROLLED WITH YOUR NAUSEA MEDICATION . *UNUSUAL SHORTNESS OF BREATH . *UNUSUAL BRUISING OR BLEEDING . *URINARY PROBLEMS (pain or burning when urinating, or frequent urination) . *BOWEL PROBLEMS (unusual diarrhea, constipation, pain near the anus) . TENDERNESS IN MOUTH AND THROAT WITH OR WITHOUT PRESENCE OF ULCERS (sore throat, sores in mouth, or a toothache) . UNUSUAL RASH, SWELLING OR PAIN  . UNUSUAL VAGINAL DISCHARGE OR ITCHING   Items with * indicate a potential emergency and should be followed up as soon as possible or go to the Emergency Department if any problems should occur.  Please show the CHEMOTHERAPY ALERT CARD or IMMUNOTHERAPY  ALERT CARD at check-in to the Emergency Department and triage nurse.  Should you have questions after your visit or need to cancel or reschedule your appointment, please contact [facility]  Dept: [phone]  and follow the prompts.  Office hours are 8:00 a.m. to 4:30 p.m. Monday - Friday. Please note that voicemails left after 4:00 p.m. may not be returned until the following business day.  We are closed weekends and major holidays. You have access to a nurse at all times for urgent questions. Please call the main number to the clinic Dept: [phone] and follow the prompts.   For any non-urgent questions, you may also contact your provider using MyChart. We now offer e-Visits for anyone 34 and older to request care online for non-urgent symptoms. For details visit [email or web address].   Also download the MyChart app! Go to the app store, search "MyChart", open the app, select [facility], and log in with your MyChart username and password.  Due to Covid, a mask is required upon entering the hospital/clinic. If you do not have a mask, one will be given to you upon arrival. For doctor visits, patients may have 1 support person aged 67 or older with them. For treatment visits, patients cannot have anyone with them due to current Covid guidelines and our immunocompromised population.

## 2020-10-13 ENCOUNTER — Ambulatory Visit
Admission: RE | Admit: 2020-10-13 | Discharge: 2020-10-13 | Disposition: A | Discharge status: Home / Self Care | Payer: Managed Care, Other (non HMO) | Source: Ambulatory Visit | Attending: Radiation Oncology | Admitting: Radiation Oncology

## 2020-10-13 ENCOUNTER — Other Ambulatory Visit: Payer: Self-pay | Admitting: Radiation Oncology

## 2020-10-13 ENCOUNTER — Encounter: Payer: Self-pay | Admitting: Internal Medicine

## 2020-10-13 DIAGNOSIS — Z5111 Encounter for antineoplastic chemotherapy: Secondary | ICD-10-CM | POA: Diagnosis not present

## 2020-10-13 MED ORDER — SUCRALFATE 1 G PO TABS: 1.0000 g | ORAL_TABLET | Freq: Three times a day (TID) | ORAL | 1 refills | Status: DC

## 2020-10-14 ENCOUNTER — Ambulatory Visit
Admission: RE | Admit: 2020-10-14 | Discharge: 2020-10-14 | Disposition: A | Discharge status: Home / Self Care | Payer: Managed Care, Other (non HMO) | Source: Ambulatory Visit | Attending: Radiation Oncology | Admitting: Radiation Oncology

## 2020-10-14 ENCOUNTER — Other Ambulatory Visit: Payer: Self-pay

## 2020-10-14 DIAGNOSIS — Z5111 Encounter for antineoplastic chemotherapy: Secondary | ICD-10-CM | POA: Diagnosis not present

## 2020-10-15 ENCOUNTER — Ambulatory Visit
Admission: RE | Admit: 2020-10-15 | Discharge: 2020-10-15 | Disposition: A | Discharge status: Home / Self Care | Payer: Managed Care, Other (non HMO) | Source: Ambulatory Visit | Attending: Radiation Oncology | Admitting: Radiation Oncology

## 2020-10-15 DIAGNOSIS — Z5111 Encounter for antineoplastic chemotherapy: Secondary | ICD-10-CM | POA: Diagnosis not present

## 2020-10-16 ENCOUNTER — Other Ambulatory Visit: Payer: Self-pay

## 2020-10-16 ENCOUNTER — Ambulatory Visit
Admission: RE | Admit: 2020-10-16 | Discharge: 2020-10-16 | Disposition: A | Discharge status: Home / Self Care | Payer: Managed Care, Other (non HMO) | Source: Ambulatory Visit | Attending: Radiation Oncology | Admitting: Radiation Oncology

## 2020-10-16 DIAGNOSIS — Z5111 Encounter for antineoplastic chemotherapy: Secondary | ICD-10-CM | POA: Diagnosis not present

## 2020-10-19 ENCOUNTER — Encounter: Payer: Self-pay | Admitting: Internal Medicine

## 2020-10-19 ENCOUNTER — Inpatient Hospital Stay (HOSPITAL_BASED_OUTPATIENT_CLINIC_OR_DEPARTMENT_OTHER): Payer: Managed Care, Other (non HMO) | Admitting: Internal Medicine

## 2020-10-19 ENCOUNTER — Inpatient Hospital Stay: Payer: Managed Care, Other (non HMO)

## 2020-10-19 ENCOUNTER — Ambulatory Visit
Admission: RE | Admit: 2020-10-19 | Discharge: 2020-10-19 | Disposition: A | Discharge status: Home / Self Care | Payer: Managed Care, Other (non HMO) | Source: Ambulatory Visit | Attending: Radiation Oncology | Admitting: Radiation Oncology

## 2020-10-19 ENCOUNTER — Other Ambulatory Visit: Payer: Self-pay

## 2020-10-19 VITALS — BP 133/91 | HR 87 | Temp 97.1°F | Resp 19 | Ht 68.5 in | Wt 198.5 lb

## 2020-10-19 DIAGNOSIS — C3491 Malignant neoplasm of unspecified part of right bronchus or lung: Secondary | ICD-10-CM

## 2020-10-19 DIAGNOSIS — Z5111 Encounter for antineoplastic chemotherapy: Secondary | ICD-10-CM | POA: Diagnosis not present

## 2020-10-19 LAB — CBC WITH DIFFERENTIAL (CANCER CENTER ONLY)
Abs Immature Granulocytes: 0.03 10*3/uL (ref 0.00–0.07)
Basophils Absolute: 0 10*3/uL (ref 0.0–0.1)
Basophils Relative: 1 %
Eosinophils Absolute: 0.1 10*3/uL (ref 0.0–0.5)
Eosinophils Relative: 2 %
HCT: 36.8 % — ABNORMAL LOW (ref 39.0–52.0)
Hemoglobin: 13 g/dL (ref 13.0–17.0)
Immature Granulocytes: 1 %
Lymphocytes Relative: 17 %
Lymphs Abs: 0.6 10*3/uL — ABNORMAL LOW (ref 0.7–4.0)
MCH: 29.4 pg (ref 26.0–34.0)
MCHC: 35.3 g/dL (ref 30.0–36.0)
MCV: 83.3 fL (ref 80.0–100.0)
Monocytes Absolute: 0.4 10*3/uL (ref 0.1–1.0)
Monocytes Relative: 12 %
Neutro Abs: 2.5 10*3/uL (ref 1.7–7.7)
Neutrophils Relative %: 67 %
Platelet Count: 127 10*3/uL — ABNORMAL LOW (ref 150–400)
RBC: 4.42 MIL/uL (ref 4.22–5.81)
RDW: 13.2 % (ref 11.5–15.5)
WBC Count: 3.7 10*3/uL — ABNORMAL LOW (ref 4.0–10.5)
nRBC: 0 % (ref 0.0–0.2)

## 2020-10-19 LAB — CMP (CANCER CENTER ONLY)
ALT: 23 U/L (ref 0–44)
AST: 17 U/L (ref 15–41)
Albumin: 3.3 g/dL — ABNORMAL LOW (ref 3.5–5.0)
Alkaline Phosphatase: 79 U/L (ref 38–126)
Anion gap: 10 (ref 5–15)
BUN: 15 mg/dL (ref 6–20)
CO2: 24 mmol/L (ref 22–32)
Calcium: 9.1 mg/dL (ref 8.9–10.3)
Chloride: 106 mmol/L (ref 98–111)
Creatinine: 0.79 mg/dL (ref 0.61–1.24)
GFR, Estimated: >60 mL/min (ref >60)
Glucose, Bld: 100 mg/dL — ABNORMAL HIGH (ref 70–99)
Potassium: 4.1 mmol/L (ref 3.5–5.1)
Sodium: 140 mmol/L (ref 135–145)
Total Bilirubin: 0.7 mg/dL (ref 0.3–1.2)
Total Protein: 7.4 g/dL (ref 6.5–8.1)

## 2020-10-19 MED ORDER — DIPHENHYDRAMINE HCL 50 MG/ML IJ SOLN
INTRAMUSCULAR | Status: AC
  Filled 2020-10-19: qty 1

## 2020-10-19 MED ORDER — PROCHLORPERAZINE MALEATE 10 MG PO TABS: 10.0000 mg | ORAL_TABLET | Freq: Four times a day (QID) | ORAL | 0 refills | Status: DC | PRN

## 2020-10-19 MED ORDER — PALONOSETRON HCL INJECTION 0.25 MG/5ML
0.2500 mg | Freq: Once | INTRAVENOUS | Status: AC
  Administered 2020-10-19: 0.25 mg via INTRAVENOUS

## 2020-10-19 MED ORDER — SODIUM CHLORIDE 0.9 % IV SOLN
290.0000 mg | Freq: Once | INTRAVENOUS | Status: AC
  Administered 2020-10-19: 290 mg via INTRAVENOUS
  Filled 2020-10-19: qty 29

## 2020-10-19 MED ORDER — DIPHENHYDRAMINE HCL 50 MG/ML IJ SOLN
50.0000 mg | Freq: Once | INTRAMUSCULAR | Status: AC
Start: 2020-10-19 — End: 2020-10-19
  Administered 2020-10-19: 50 mg via INTRAVENOUS

## 2020-10-19 MED ORDER — SODIUM CHLORIDE 0.9 % IV SOLN
Freq: Once | INTRAVENOUS | Status: AC
  Filled 2020-10-19: qty 250

## 2020-10-19 MED ORDER — FAMOTIDINE 20 MG IN NS 100 ML IVPB
20.0000 mg | Freq: Once | INTRAVENOUS | Status: AC
  Administered 2020-10-19: 20 mg via INTRAVENOUS

## 2020-10-19 MED ORDER — PALONOSETRON HCL INJECTION 0.25 MG/5ML
INTRAVENOUS | Status: AC
  Filled 2020-10-19: qty 5

## 2020-10-19 MED ORDER — SODIUM CHLORIDE 0.9 % IV SOLN
45.0000 mg/m2 | Freq: Once | INTRAVENOUS | Status: AC
  Administered 2020-10-19: 90 mg via INTRAVENOUS
  Filled 2020-10-19: qty 15

## 2020-10-19 MED ORDER — FAMOTIDINE 20 MG IN NS 100 ML IVPB
INTRAVENOUS | Status: AC
  Filled 2020-10-19: qty 100

## 2020-10-19 MED ORDER — SODIUM CHLORIDE 0.9 % IV SOLN
20.0000 mg | Freq: Once | INTRAVENOUS | Status: AC
  Administered 2020-10-19: 20 mg via INTRAVENOUS
  Filled 2020-10-19: qty 20

## 2020-10-19 NOTE — Patient Instructions (Signed)
[facility]  Discharge Instructions: Thank you for choosing [facility] to provide your oncology and hematology care.   If you have a lab appointment with the Cancer Center, please go directly to the Cancer Center and check in at the registration area.   Wear comfortable clothing and clothing appropriate for easy access to any Portacath or PICC line.   We strive to give you quality time with your provider. You may need to reschedule your appointment if you arrive late (15 or more minutes).  Arriving late affects you and other patients whose appointments are after yours.  Also, if you miss three or more appointments without notifying the office, you may be dismissed from the clinic at the provider's discretion.      For prescription refill requests, have your pharmacy contact our office and allow 72 hours for refills to be completed.    Today you received the following chemotherapy and/or immunotherapy agents: Taxol & Carboplatin   To help prevent nausea and vomiting after your treatment, we encourage you to take your nausea medication as directed.  BELOW ARE SYMPTOMS THAT SHOULD BE REPORTED IMMEDIATELY: *FEVER GREATER THAN 100.4 F (38 C) OR HIGHER *CHILLS OR SWEATING *NAUSEA AND VOMITING THAT IS NOT CONTROLLED WITH YOUR NAUSEA MEDICATION *UNUSUAL SHORTNESS OF BREATH *UNUSUAL BRUISING OR BLEEDING *URINARY PROBLEMS (pain or burning when urinating, or frequent urination) *BOWEL PROBLEMS (unusual diarrhea, constipation, pain near the anus) TENDERNESS IN MOUTH AND THROAT WITH OR WITHOUT PRESENCE OF ULCERS (sore throat, sores in mouth, or a toothache) UNUSUAL RASH, SWELLING OR PAIN  UNUSUAL VAGINAL DISCHARGE OR ITCHING   Items with * indicate a potential emergency and should be followed up as soon as possible or go to the Emergency Department if any problems should occur.  Please show the CHEMOTHERAPY ALERT CARD or IMMUNOTHERAPY ALERT CARD at  check-in to the Emergency Department and triage nurse.  Should you have questions after your visit or need to cancel or reschedule your appointment, please contact [facility]  Dept: [phone]  and follow the prompts.  Office hours are 8:00 a.m. to 4:30 p.m. Monday - Friday. Please note that voicemails left after 4:00 p.m. may not be returned until the following business day.  We are closed weekends and major holidays. You have access to a nurse at all times for urgent questions. Please call the main number to the clinic Dept: [phone] and follow the prompts.   For any non-urgent questions, you may also contact your provider using MyChart. We now offer e-Visits for anyone 18 and older to request care online for non-urgent symptoms. For details visit [email or web address].   Also download the MyChart app! Go to the app store, search "MyChart", open the app, select , and log in with your MyChart username and password.  Due to Covid, a mask is required upon entering the hospital/clinic. If you do not have a mask, one will be given to you upon arrival. For doctor visits, patients may have 1 support person aged 18 or older with them. For treatment visits, patients cannot have anyone with them due to current Covid guidelines and our immunocompromised population.   

## 2020-10-19 NOTE — Progress Notes (Signed)
[facility] Telephone:(336) [phone]   Fax:(336) [phone]  OFFICE PROGRESS NOTE  [name], MD [address] [place] [address]  DIAGNOSIS: Stage IIIb (T3, N2, M0) non-small cell lung cancer, squamous cell carcinoma presented with large right upper lobe perihilar mass with right hilar and subcarinal lymphadenopathy diagnosed in April 2022.  PRIOR THERAPY: None  CURRENT THERAPY: Concurrent chemoradiation with weekly carboplatin for AUC of 2 and paclitaxel 45 Mg/M2.  Status post 4 cycles.  INTERVAL HISTORY: Paul Mclean 60 y.o. male returns to the clinic today for follow-up visit.  The patient is feeling fine today with no concerning complaints except for insomnia.  He denied having any chest pain but has shortness of breath with exertion with no cough or hemoptysis.  He denied having any fever or chills.  He has no nausea, vomiting, diarrhea or constipation.  He has no headache or visual changes.  He continues to tolerate his concurrent chemoradiation fairly well.  The patient is here today for evaluation before starting cycle #5.   MEDICAL HISTORY: Past Medical History:  Diagnosis Date   Hypothyroidism    Thyroid disease     ALLERGIES:  has No Known Allergies.  MEDICATIONS:  Current Outpatient Medications  Medication Sig Dispense Refill   levothyroxine (SYNTHROID) 112 MCG tablet Take 112 mcg by mouth in the morning.     prochlorperazine (COMPAZINE) 10 MG tablet Take 1 tablet (10 mg total) by mouth every 6 (six) hours as needed for nausea or vomiting. 30 tablet 0   sucralfate (CARAFATE) 1 g tablet Take 1 tablet (1 g total) by mouth 4 (four) times daily -  with meals and at bedtime. Crush and dissolve in 10 mL of warm water prior to swallowing 120 tablet 1   umeclidinium-vilanterol (ANORO ELLIPTA) 62.5-25 MCG/INH AEPB Inhale 1 puff into the lungs daily. 60 each 6   No current facility-administered medications for this visit.    SURGICAL HISTORY:   Past Surgical History:  Procedure Laterality Date   APPENDECTOMY     BRONCHIAL BIOPSY  09/01/2020   Procedure: BRONCHIAL BIOPSIES;  Surgeon: Garner Nash, DO;  Location: [facility];  Service: Pulmonary;;   BRONCHIAL BRUSHINGS  09/01/2020   Procedure: BRONCHIAL BRUSHINGS;  Surgeon: Garner Nash, DO;  Location: [facility];  Service: Pulmonary;;   BRONCHIAL NEEDLE ASPIRATION BIOPSY  09/01/2020   Procedure: BRONCHIAL NEEDLE ASPIRATION BIOPSIES;  Surgeon: Garner Nash, DO;  Location: [facility];  Service: Pulmonary;;   BRONCHIAL WASHINGS  09/01/2020   Procedure: BRONCHIAL WASHINGS;  Surgeon: Garner Nash, DO;  Location: [facility];  Service: Pulmonary;;   HERNIA REPAIR     INCISION AND DRAINAGE ABSCESS N/A 08/25/2017   Procedure: INCISION AND DRAINAGE perineal  ABSCESS;  Surgeon: Festus Aloe, MD;  Location: [facility];  Service: Urology;  Laterality: N/A;   VIDEO BRONCHOSCOPY WITH ENDOBRONCHIAL ULTRASOUND N/A 09/01/2020   Procedure: VIDEO BRONCHOSCOPY WITH ENDOBRONCHIAL ULTRASOUND;  Surgeon: Garner Nash, DO;  Location: [facility];  Service: Pulmonary;  Laterality: N/A;    REVIEW OF SYSTEMS:  A comprehensive review of systems was negative except for: Respiratory: positive for dyspnea on exertion Behavioral/Psych: positive for sleep disturbance   PHYSICAL EXAMINATION: General appearance: alert, cooperative, and no distress Head: Normocephalic, without obvious abnormality, atraumatic Neck: no adenopathy, no JVD, supple, symmetrical, trachea midline, and thyroid not enlarged, symmetric, no tenderness/mass/nodules Lymph nodes: Cervical, supraclavicular, and axillary nodes normal. Resp: clear to auscultation bilaterally Back: symmetric,  no curvature. ROM normal. No CVA tenderness. Cardio: regular rate and rhythm, S1, S2 normal, no murmur, click, rub or gallop GI: soft, non-tender; bowel sounds normal; no masses,  no organomegaly Extremities: extremities normal,  atraumatic, no cyanosis or edema  ECOG PERFORMANCE STATUS: 1 - Symptomatic but completely ambulatory  Blood pressure (!) 133/91, pulse 87, temperature (!) 97.1 F (36.2 C), temperature source Tympanic, resp. rate 19, height 5' 8.5" (1.74 m), weight 198 lb 8 oz (90 kg), SpO2 98 %.  LABORATORY DATA: Lab Results  Component Value Date   WBC 3.7 (L) 10/19/2020   HGB 13.0 10/19/2020   HCT 36.8 (L) 10/19/2020   MCV 83.3 10/19/2020   PLT 127 (L) 10/19/2020      Chemistry      Component Value Date/Time   NA 137 10/12/2020 0937   K 4.3 10/12/2020 0937   CL 103 10/12/2020 0937   CO2 23 10/12/2020 0937   BUN 14 10/12/2020 0937   CREATININE 0.78 10/12/2020 0937      Component Value Date/Time   CALCIUM 9.5 10/12/2020 0937   ALKPHOS 85 10/12/2020 0937   AST 13 (L) 10/12/2020 0937   ALT 23 10/12/2020 0937   BILITOT 0.5 10/12/2020 0937       RADIOGRAPHIC STUDIES: No results found.   ASSESSMENT AND PLAN: This is a very pleasant 60 years old white male recently diagnosed with stage IIIb (T3, N2, M0) non-small cell lung cancer, squamous cell carcinoma presented with large right upper lobe perihilar mass with right hilar and subcarinal lymphadenopathy diagnosed in April 2022. The patient is currently undergoing a course of concurrent chemoradiation with weekly carboplatin and paclitaxel status post 4 cycles.  The patient continues to tolerate his treatment well with no concerning adverse effects. I recommended for him to proceed with cycle #5 today as planned. For the nausea I will give him refill of Compazine. The patient will come back for follow-up visit in 2 weeks for evaluation before starting cycle #7. He was advised to call immediately if he has any concerning symptoms in the interval. The patient voices understanding of current disease status and treatment options and is in agreement with the current care plan.  All questions were answered. The patient knows to call the clinic  with any problems, questions or concerns. We can certainly see the patient much sooner if necessary.   Disclaimer: This note was dictated with voice recognition software. Similar sounding words can inadvertently be transcribed and may not be corrected upon review.

## 2020-10-20 ENCOUNTER — Other Ambulatory Visit: Payer: Self-pay | Admitting: Radiation Oncology

## 2020-10-20 ENCOUNTER — Ambulatory Visit
Admission: RE | Admit: 2020-10-20 | Discharge: 2020-10-20 | Disposition: A | Discharge status: Home / Self Care | Payer: Managed Care, Other (non HMO) | Source: Ambulatory Visit | Attending: Radiation Oncology | Admitting: Radiation Oncology

## 2020-10-20 DIAGNOSIS — C3491 Malignant neoplasm of unspecified part of right bronchus or lung: Secondary | ICD-10-CM

## 2020-10-20 DIAGNOSIS — Z5111 Encounter for antineoplastic chemotherapy: Secondary | ICD-10-CM | POA: Diagnosis not present

## 2020-10-20 MED ORDER — SONAFINE EX EMUL
1.0000 | Freq: Once | CUTANEOUS | Status: AC
Start: 2020-10-20 — End: 2020-10-20
  Administered 2020-10-20: 1 via TOPICAL

## 2020-10-20 MED ORDER — LORAZEPAM 0.5 MG PO TABS
0.5000 mg | ORAL_TABLET | Freq: Every day | ORAL | 0 refills | Status: DC
Start: 2020-10-20 — End: 2021-03-30

## 2020-10-21 ENCOUNTER — Ambulatory Visit
Admission: RE | Admit: 2020-10-21 | Discharge: 2020-10-21 | Disposition: A | Discharge status: Home / Self Care | Payer: Managed Care, Other (non HMO) | Source: Ambulatory Visit | Attending: Radiation Oncology | Admitting: Radiation Oncology

## 2020-10-21 ENCOUNTER — Other Ambulatory Visit: Payer: Self-pay

## 2020-10-21 DIAGNOSIS — Z5111 Encounter for antineoplastic chemotherapy: Secondary | ICD-10-CM | POA: Diagnosis not present

## 2020-10-22 ENCOUNTER — Ambulatory Visit
Admission: RE | Admit: 2020-10-22 | Discharge: 2020-10-22 | Disposition: A | Discharge status: Home / Self Care | Payer: Managed Care, Other (non HMO) | Source: Ambulatory Visit | Attending: Radiation Oncology | Admitting: Radiation Oncology

## 2020-10-22 DIAGNOSIS — Z5111 Encounter for antineoplastic chemotherapy: Secondary | ICD-10-CM | POA: Diagnosis not present

## 2020-10-23 ENCOUNTER — Other Ambulatory Visit: Payer: Self-pay

## 2020-10-23 ENCOUNTER — Ambulatory Visit
Admission: RE | Admit: 2020-10-23 | Discharge: 2020-10-23 | Disposition: A | Discharge status: Home / Self Care | Payer: Managed Care, Other (non HMO) | Source: Ambulatory Visit | Attending: Radiation Oncology | Admitting: Radiation Oncology

## 2020-10-23 DIAGNOSIS — Z5111 Encounter for antineoplastic chemotherapy: Secondary | ICD-10-CM | POA: Diagnosis not present

## 2020-10-26 ENCOUNTER — Other Ambulatory Visit: Payer: Self-pay

## 2020-10-26 ENCOUNTER — Inpatient Hospital Stay: Payer: Managed Care, Other (non HMO)

## 2020-10-26 ENCOUNTER — Ambulatory Visit
Admission: RE | Admit: 2020-10-26 | Discharge: 2020-10-26 | Disposition: A | Discharge status: Home / Self Care | Payer: Managed Care, Other (non HMO) | Source: Ambulatory Visit | Attending: Radiation Oncology | Admitting: Radiation Oncology

## 2020-10-26 VITALS — BP 124/88 | HR 89 | Temp 97.6°F | Resp 20 | Wt 201.8 lb

## 2020-10-26 DIAGNOSIS — C3491 Malignant neoplasm of unspecified part of right bronchus or lung: Secondary | ICD-10-CM

## 2020-10-26 DIAGNOSIS — Z5111 Encounter for antineoplastic chemotherapy: Secondary | ICD-10-CM | POA: Diagnosis not present

## 2020-10-26 LAB — CBC WITH DIFFERENTIAL (CANCER CENTER ONLY)
Abs Immature Granulocytes: 0.03 10*3/uL (ref 0.00–0.07)
Basophils Absolute: 0 10*3/uL (ref 0.0–0.1)
Basophils Relative: 0 %
Eosinophils Absolute: 0.1 10*3/uL (ref 0.0–0.5)
Eosinophils Relative: 2 %
HCT: 36.3 % — ABNORMAL LOW (ref 39.0–52.0)
Hemoglobin: 12.9 g/dL — ABNORMAL LOW (ref 13.0–17.0)
Immature Granulocytes: 1 %
Lymphocytes Relative: 13 %
Lymphs Abs: 0.4 10*3/uL — ABNORMAL LOW (ref 0.7–4.0)
MCH: 29.6 pg (ref 26.0–34.0)
MCHC: 35.5 g/dL (ref 30.0–36.0)
MCV: 83.3 fL (ref 80.0–100.0)
Monocytes Absolute: 0.4 10*3/uL (ref 0.1–1.0)
Monocytes Relative: 10 %
Neutro Abs: 2.5 10*3/uL (ref 1.7–7.7)
Neutrophils Relative %: 74 %
Platelet Count: 123 10*3/uL — ABNORMAL LOW (ref 150–400)
RBC: 4.36 MIL/uL (ref 4.22–5.81)
RDW: 13.3 % (ref 11.5–15.5)
WBC Count: 3.4 10*3/uL — ABNORMAL LOW (ref 4.0–10.5)
nRBC: 0 % (ref 0.0–0.2)

## 2020-10-26 LAB — CMP (CANCER CENTER ONLY)
ALT: 25 U/L (ref 0–44)
AST: 18 U/L (ref 15–41)
Albumin: 3.7 g/dL (ref 3.5–5.0)
Alkaline Phosphatase: 76 U/L (ref 38–126)
Anion gap: 5 (ref 5–15)
BUN: 19 mg/dL (ref 6–20)
CO2: 27 mmol/L (ref 22–32)
Calcium: 9.1 mg/dL (ref 8.9–10.3)
Chloride: 104 mmol/L (ref 98–111)
Creatinine: 0.89 mg/dL (ref 0.61–1.24)
GFR, Estimated: >60 mL/min (ref >60)
Glucose, Bld: 103 mg/dL — ABNORMAL HIGH (ref 70–99)
Potassium: 4.3 mmol/L (ref 3.5–5.1)
Sodium: 136 mmol/L (ref 135–145)
Total Bilirubin: 0.8 mg/dL (ref 0.3–1.2)
Total Protein: 7.4 g/dL (ref 6.5–8.1)

## 2020-10-26 MED ORDER — SODIUM CHLORIDE 0.9 % IV SOLN
287.2000 mg | Freq: Once | INTRAVENOUS | Status: AC
  Administered 2020-10-26: 290 mg via INTRAVENOUS
  Filled 2020-10-26: qty 29

## 2020-10-26 MED ORDER — PALONOSETRON HCL INJECTION 0.25 MG/5ML
INTRAVENOUS | Status: AC
  Filled 2020-10-26: qty 5

## 2020-10-26 MED ORDER — DIPHENHYDRAMINE HCL 50 MG/ML IJ SOLN
INTRAMUSCULAR | Status: AC
  Filled 2020-10-26: qty 1

## 2020-10-26 MED ORDER — PALONOSETRON HCL INJECTION 0.25 MG/5ML
0.2500 mg | Freq: Once | INTRAVENOUS | Status: AC
  Administered 2020-10-26: 0.25 mg via INTRAVENOUS

## 2020-10-26 MED ORDER — DIPHENHYDRAMINE HCL 50 MG/ML IJ SOLN
50.0000 mg | Freq: Once | INTRAMUSCULAR | Status: AC
Start: 2020-10-26 — End: 2020-10-26
  Administered 2020-10-26: 50 mg via INTRAVENOUS

## 2020-10-26 MED ORDER — FAMOTIDINE 20 MG IN NS 100 ML IVPB
20.0000 mg | Freq: Once | INTRAVENOUS | Status: AC
  Administered 2020-10-26: 20 mg via INTRAVENOUS

## 2020-10-26 MED ORDER — SODIUM CHLORIDE 0.9 % IV SOLN
Freq: Once | INTRAVENOUS | Status: AC
Start: 2020-10-26 — End: 2020-10-26
  Filled 2020-10-26: qty 250

## 2020-10-26 MED ORDER — DEXAMETHASONE SODIUM PHOSPHATE 100 MG/10ML IJ SOLN
20.0000 mg | Freq: Once | INTRAMUSCULAR | Status: AC
  Administered 2020-10-26: 20 mg via INTRAVENOUS
  Filled 2020-10-26: qty 20

## 2020-10-26 MED ORDER — FAMOTIDINE 20 MG IN NS 100 ML IVPB
INTRAVENOUS | Status: AC
  Filled 2020-10-26: qty 100

## 2020-10-26 MED ORDER — SODIUM CHLORIDE 0.9 % IV SOLN
45.0000 mg/m2 | Freq: Once | INTRAVENOUS | Status: AC
  Administered 2020-10-26: 90 mg via INTRAVENOUS
  Filled 2020-10-26: qty 15

## 2020-10-26 NOTE — Patient Instructions (Signed)
[facility]  Discharge Instructions: Thank you for choosing [facility] to provide your oncology and hematology care.   If you have a lab appointment with the Cancer Center, please go directly to the Cancer Center and check in at the registration area.   Wear comfortable clothing and clothing appropriate for easy access to any Portacath or PICC line.   We strive to give you quality time with your provider. You may need to reschedule your appointment if you arrive late (15 or more minutes).  Arriving late affects you and other patients whose appointments are after yours.  Also, if you miss three or more appointments without notifying the office, you may be dismissed from the clinic at the provider's discretion.      For prescription refill requests, have your pharmacy contact our office and allow 72 hours for refills to be completed.    Today you received the following chemotherapy and/or immunotherapy agents: Paclitaxel (Taxol) and Carboplatin.   To help prevent nausea and vomiting after your treatment, we encourage you to take your nausea medication as directed.  BELOW ARE SYMPTOMS THAT SHOULD BE REPORTED IMMEDIATELY: *FEVER GREATER THAN 100.4 F (38 C) OR HIGHER *CHILLS OR SWEATING *NAUSEA AND VOMITING THAT IS NOT CONTROLLED WITH YOUR NAUSEA MEDICATION *UNUSUAL SHORTNESS OF BREATH *UNUSUAL BRUISING OR BLEEDING *URINARY PROBLEMS (pain or burning when urinating, or frequent urination) *BOWEL PROBLEMS (unusual diarrhea, constipation, pain near the anus) TENDERNESS IN MOUTH AND THROAT WITH OR WITHOUT PRESENCE OF ULCERS (sore throat, sores in mouth, or a toothache) UNUSUAL RASH, SWELLING OR PAIN  UNUSUAL VAGINAL DISCHARGE OR ITCHING   Items with * indicate a potential emergency and should be followed up as soon as possible or go to the Emergency Department if any problems should occur.  Please show the CHEMOTHERAPY ALERT CARD or IMMUNOTHERAPY  ALERT CARD at check-in to the Emergency Department and triage nurse.  Should you have questions after your visit or need to cancel or reschedule your appointment, please contact [facility]  Dept: [phone]  and follow the prompts.  Office hours are 8:00 a.m. to 4:30 p.m. Monday - Friday. Please note that voicemails left after 4:00 p.m. may not be returned until the following business day.  We are closed weekends and major holidays. You have access to a nurse at all times for urgent questions. Please call the main number to the clinic Dept: [phone] and follow the prompts.   For any non-urgent questions, you may also contact your provider using MyChart. We now offer e-Visits for anyone 18 and older to request care online for non-urgent symptoms. For details visit [email or web address].   Also download the MyChart app! Go to the app store, search "MyChart", open the app, select , and log in with your MyChart username and password.  Due to Covid, a mask is required upon entering the hospital/clinic. If you do not have a mask, one will be given to you upon arrival. For doctor visits, patients may have 1 support person aged 18 or older with them. For treatment visits, patients cannot have anyone with them due to current Covid guidelines and our immunocompromised population.   

## 2020-10-27 ENCOUNTER — Ambulatory Visit
Admission: RE | Admit: 2020-10-27 | Discharge: 2020-10-27 | Disposition: A | Discharge status: Home / Self Care | Payer: Managed Care, Other (non HMO) | Source: Ambulatory Visit | Attending: Radiation Oncology | Admitting: Radiation Oncology

## 2020-10-27 DIAGNOSIS — Z5111 Encounter for antineoplastic chemotherapy: Secondary | ICD-10-CM | POA: Diagnosis not present

## 2020-10-27 NOTE — Progress Notes (Signed)
[facility] OFFICE PROGRESS NOTE  [name], MD [address] [place] [address]  DIAGNOSIS: Stage IIIb (T3, N2, M0) non-small cell lung cancer, squamous cell carcinoma presented with large right upper lobe perihilar mass with right hilar and subcarinal lymphadenopathy diagnosed in April 2022.  PRIOR THERAPY: None  CURRENT THERAPY: Concurrent chemoradiation with weekly carboplatin for AUC of 2 and paclitaxel 45 Mg/M2.  Status post 7 cycles  INTERVAL HISTORY: Paul Mclean 60 y.o. male returns to the clinic for a follow up visit accompanied by his wife. The patient is feeling well today without any concerning complaints except for ongoing insomnia. He reports he has trouble staying asleep and wakes up 13-15 times per night every night. He states he has tried melatonin, ativan, and tylenol PM. He continues to have issues with sleep and states this started when he began receiving treatment. The patient continues to tolerate treatment with weekly carboplatin and paclitaxel well without any adverse effects. His energy levels are slightly below normal although his appetite has maintained without any weight loss. He reports mild swallowing difficulty following radiation although this has been manageable with carafate. Denies any fever, chills, night sweats, or weight loss. His cough remains the same, and he denies any chest pain, hemoptysis, or shortness of breath. Denies any vomiting, diarrhea, or constipation. Denies any headache. Denies any numbness or tingling. He reports only occasional nausea for which he had been taking compazine. He reports compazine causes him to have dizziness as well as vision changes and would like to try a different nausea medication. He reports his only skin changes as redness surrounding the area of radiation. He has been applying cream which has been helpful. The patient is here today for evaluation prior to starting cycle # 7   MEDICAL  HISTORY: Past Medical History:  Diagnosis Date   Hypothyroidism    Thyroid disease     ALLERGIES:  has No Known Allergies.  MEDICATIONS:  Current Outpatient Medications  Medication Sig Dispense Refill   levothyroxine (SYNTHROID) 112 MCG tablet Take 112 mcg by mouth in the morning.     LORazepam (ATIVAN) 0.5 MG tablet Take 1 tablet (0.5 mg total) by mouth at bedtime. 20 tablet 0   ondansetron (ZOFRAN) 8 MG tablet Take 1 tablet (8 mg total) by mouth every 8 (eight) hours as needed for nausea or vomiting. Starting 3 days after treatment 30 tablet 1   prochlorperazine (COMPAZINE) 10 MG tablet Take 1 tablet (10 mg total) by mouth every 6 (six) hours as needed for nausea or vomiting. 30 tablet 0   sucralfate (CARAFATE) 1 g tablet Take 1 tablet (1 g total) by mouth 4 (four) times daily -  with meals and at bedtime. Crush and dissolve in 10 mL of warm water prior to swallowing 120 tablet 1   umeclidinium-vilanterol (ANORO ELLIPTA) 62.5-25 MCG/INH AEPB Inhale 1 puff into the lungs daily. 60 each 6   No current facility-administered medications for this visit.    SURGICAL HISTORY:  Past Surgical History:  Procedure Laterality Date   APPENDECTOMY     BRONCHIAL BIOPSY  09/01/2020   Procedure: BRONCHIAL BIOPSIES;  Surgeon: Garner Nash, DO;  Location: [facility];  Service: Pulmonary;;   BRONCHIAL BRUSHINGS  09/01/2020   Procedure: BRONCHIAL BRUSHINGS;  Surgeon: Garner Nash, DO;  Location: [facility];  Service: Pulmonary;;   BRONCHIAL NEEDLE ASPIRATION BIOPSY  09/01/2020   Procedure: BRONCHIAL NEEDLE ASPIRATION BIOPSIES;  Surgeon: Garner Nash, DO;  Location: [facility];  Service: Pulmonary;;   BRONCHIAL WASHINGS  09/01/2020   Procedure: BRONCHIAL WASHINGS;  Surgeon: Garner Nash, DO;  Location: [facility];  Service: Pulmonary;;   HERNIA REPAIR     INCISION AND DRAINAGE ABSCESS N/A 08/25/2017   Procedure: INCISION AND DRAINAGE perineal  ABSCESS;  Surgeon: Festus Aloe,  MD;  Location: [facility];  Service: Urology;  Laterality: N/A;   VIDEO BRONCHOSCOPY WITH ENDOBRONCHIAL ULTRASOUND N/A 09/01/2020   Procedure: VIDEO BRONCHOSCOPY WITH ENDOBRONCHIAL ULTRASOUND;  Surgeon: Garner Nash, DO;  Location: [facility];  Service: Pulmonary;  Laterality: N/A;    REVIEW OF SYSTEMS:   Review of Systems  Constitutional: Positive for mild fatigue. Negative for appetite change, chills, fever and unexpected weight change.  HENT: Negative for mouth sores, nosebleeds, sore throat and trouble swallowing.   Eyes: Negative for eye problems and icterus.  Respiratory: Positive cough. Negative for hemoptysis, shortness of breath and wheezing.   Cardiovascular: Negative for chest pain and leg swelling.  Gastrointestinal: Negative for abdominal pain, constipation, diarrhea, nausea and vomiting.  Genitourinary: Negative for bladder incontinence, difficulty urinating, dysuria, frequency and hematuria.   Musculoskeletal: Negative for back pain, gait problem, neck pain and neck stiffness.  Skin: Positive rash/erythema. Negative for itching Neurological: Negative for dizziness, extremity weakness, gait problem, headaches, light-headedness and seizures.  Hematological: Negative for adenopathy. Does not bruise/bleed easily.  Psychiatric/Behavioral: Positive sleep disturbance. Negative for confusion or depression. The patient is not nervous/anxious.     PHYSICAL EXAMINATION:  Blood pressure 125/87, pulse 99, temperature 97.7 F (36.5 C), temperature source Tympanic, resp. rate 18, height 5\' 8"  (1.727 m), weight 201 lb 12.8 oz (91.5 kg), SpO2 100 %.  ECOG PERFORMANCE STATUS: 1  Physical Exam  Constitutional: Oriented to person, place, and time and well-developed, well-nourished, and in no distress.  HENT:  Head: Normocephalic and atraumatic.  Mouth/Throat: Oropharynx is clear and moist. No oropharyngeal exudate.  Eyes: Conjunctivae are normal. Right eye exhibits no discharge. Left eye  exhibits no discharge. No scleral icterus.  Neck: Normal range of motion. Neck supple.  Cardiovascular: Normal rate, regular rhythm, normal heart sounds and intact distal pulses.   Pulmonary/Chest: Effort normal. Mild crackles in lung bilaterally. No respiratory distress. No wheezes.  Abdominal: Soft. Bowel sounds are normal. Exhibits no distension and no mass. There is no tenderness.  Musculoskeletal: Normal range of motion. Exhibits no edema.  Lymphadenopathy:    No cervical adenopathy.  Neurological: Alert and oriented to person, place, and time. Exhibits normal muscle tone. Gait normal. Coordination normal.  Skin: Skin is warm and dry. Erythema on chest due to radiation. No rash noted. Not diaphoretic. No pallor.  Psychiatric: Mood, memory and judgment normal.  Vitals reviewed.  LABORATORY DATA: Lab Results  Component Value Date   WBC 2.5 (L) 11/02/2020   HGB 13.2 11/02/2020   HCT 36.9 (L) 11/02/2020   MCV 83.7 11/02/2020   PLT 207 11/02/2020      Chemistry      Component Value Date/Time   NA 136 11/02/2020 0934   K 4.2 11/02/2020 0934   CL 103 11/02/2020 0934   CO2 23 11/02/2020 0934   BUN 12 11/02/2020 0934   CREATININE 0.80 11/02/2020 0934      Component Value Date/Time   CALCIUM 9.2 11/02/2020 0934   ALKPHOS 81 11/02/2020 0934   AST 15 11/02/2020 0934   ALT 19 11/02/2020 0934   BILITOT 0.7 11/02/2020 0934       RADIOGRAPHIC STUDIES:  No results found.   ASSESSMENT/PLAN:  This is a very pleasant 60 year old Caucasian male diagnosed with stage IIIb (T3, N2, M0) non-small cell lung cancer, squamous cell carcinoma.  The patient presented with a large right upper lobe perihilar mass with right hilar and subcarinal lymphadenopathy.  The patient was diagnosed in April 2022.  The patient is currently undergoing a course of concurrent chemoradiation with weekly carboplatin and paclitaxel.  The patient is currently status post 6 cycles.  The patient's last day  radiation is scheduled to be completed on 11/12/2020.  Labs were reviewed.  Recommend that he  proceed with cycle #7 today scheduled.  I will arrange for 1 additional week of treatment since his last radiation is scheduled for 11/12/20  I will arrange for restaging CT scan to be performed approximately 3 to 4 weeks after his last day radiation.  We will see him back for follow-up visit a few days later for evaluation to review his scan results.  The patient was encouraged to follow up with his established PCP regarding his ongoing insomnia. He is interested in waiting until after his final round of treatment to see if the issue resolves and is aware he should follow up with his PCP if the sleep disturbance persists.   Regarding the patient's nausea and adverse effects to compazine he will be prescribed Zofran to take as needed. He is aware to not take the medication until three days after receiving treatment since he is given aloxi which will last for 72 hours. He can take ativan if he has nausea within the first 3 days after treatment.   The patient was advised to call immediately if he has any concerning symptoms in the interval. The patient voices understanding of current disease status and treatment options and is in agreement with the current care plan. All questions were answered. The patient knows to call the clinic with any problems, questions or concerns. We can certainly see the patient much sooner if necessary    Orders Placed This Encounter  Procedures   CT Chest W Contrast    Standing Status:   Future    Standing Expiration Date:   11/02/2021    Order Specific Question:   If indicated for the ordered procedure, I authorize the administration of contrast media per Radiology protocol    Answer:   Yes    Order Specific Question:   Preferred imaging location?    Answer:   [facility]   CBC with Differential ([facility] Only)    Standing Status:   Future    Standing  Expiration Date:   11/02/2021   CMP ([facility] only)    Standing Status:   Future    Standing Expiration Date:   11/02/2021      The total time spent in the appointment was 20-29 minutes   Zohaib Heeney L Ramla Hase, PA-C 11/02/20

## 2020-10-28 ENCOUNTER — Ambulatory Visit
Admission: RE | Admit: 2020-10-28 | Discharge: 2020-10-28 | Disposition: A | Discharge status: Home / Self Care | Payer: Managed Care, Other (non HMO) | Source: Ambulatory Visit | Attending: Radiation Oncology | Admitting: Radiation Oncology

## 2020-10-28 ENCOUNTER — Other Ambulatory Visit: Payer: Self-pay

## 2020-10-28 DIAGNOSIS — Z5111 Encounter for antineoplastic chemotherapy: Secondary | ICD-10-CM | POA: Diagnosis not present

## 2020-10-29 ENCOUNTER — Ambulatory Visit
Admission: RE | Admit: 2020-10-29 | Discharge: 2020-10-29 | Disposition: A | Discharge status: Home / Self Care | Payer: Managed Care, Other (non HMO) | Source: Ambulatory Visit | Attending: Radiation Oncology | Admitting: Radiation Oncology

## 2020-10-29 DIAGNOSIS — Z5111 Encounter for antineoplastic chemotherapy: Secondary | ICD-10-CM | POA: Diagnosis not present

## 2020-10-30 ENCOUNTER — Other Ambulatory Visit: Payer: Self-pay

## 2020-10-30 ENCOUNTER — Ambulatory Visit
Admission: RE | Admit: 2020-10-30 | Discharge: 2020-10-30 | Disposition: A | Discharge status: Home / Self Care | Payer: Managed Care, Other (non HMO) | Source: Ambulatory Visit | Attending: Radiation Oncology | Admitting: Radiation Oncology

## 2020-10-30 DIAGNOSIS — Z5111 Encounter for antineoplastic chemotherapy: Secondary | ICD-10-CM | POA: Diagnosis not present

## 2020-11-02 ENCOUNTER — Ambulatory Visit
Admission: RE | Admit: 2020-11-02 | Discharge: 2020-11-02 | Disposition: A | Discharge status: Home / Self Care | Payer: Managed Care, Other (non HMO) | Source: Ambulatory Visit | Attending: Radiation Oncology | Admitting: Radiation Oncology

## 2020-11-02 ENCOUNTER — Inpatient Hospital Stay: Payer: Managed Care, Other (non HMO)

## 2020-11-02 ENCOUNTER — Inpatient Hospital Stay (HOSPITAL_BASED_OUTPATIENT_CLINIC_OR_DEPARTMENT_OTHER): Payer: Managed Care, Other (non HMO) | Admitting: Physician Assistant

## 2020-11-02 ENCOUNTER — Other Ambulatory Visit: Payer: Self-pay | Admitting: Physician Assistant

## 2020-11-02 ENCOUNTER — Other Ambulatory Visit: Payer: Self-pay

## 2020-11-02 ENCOUNTER — Telehealth: Payer: Self-pay | Admitting: Physician Assistant

## 2020-11-02 VITALS — BP 125/87 | HR 99 | Temp 97.7°F | Resp 18 | Ht 68.0 in | Wt 201.8 lb

## 2020-11-02 DIAGNOSIS — C3491 Malignant neoplasm of unspecified part of right bronchus or lung: Secondary | ICD-10-CM

## 2020-11-02 DIAGNOSIS — Z5111 Encounter for antineoplastic chemotherapy: Secondary | ICD-10-CM | POA: Diagnosis not present

## 2020-11-02 LAB — CMP (CANCER CENTER ONLY)
ALT: 19 U/L (ref 0–44)
AST: 15 U/L (ref 15–41)
Albumin: 3.3 g/dL — ABNORMAL LOW (ref 3.5–5.0)
Alkaline Phosphatase: 81 U/L (ref 38–126)
Anion gap: 10 (ref 5–15)
BUN: 12 mg/dL (ref 6–20)
CO2: 23 mmol/L (ref 22–32)
Calcium: 9.2 mg/dL (ref 8.9–10.3)
Chloride: 103 mmol/L (ref 98–111)
Creatinine: 0.8 mg/dL (ref 0.61–1.24)
GFR, Estimated: >60 mL/min (ref >60)
Glucose, Bld: 103 mg/dL — ABNORMAL HIGH (ref 70–99)
Potassium: 4.2 mmol/L (ref 3.5–5.1)
Sodium: 136 mmol/L (ref 135–145)
Total Bilirubin: 0.7 mg/dL (ref 0.3–1.2)
Total Protein: 7.9 g/dL (ref 6.5–8.1)

## 2020-11-02 LAB — CBC WITH DIFFERENTIAL (CANCER CENTER ONLY)
Abs Immature Granulocytes: 0.02 10*3/uL (ref 0.00–0.07)
Basophils Absolute: 0 10*3/uL (ref 0.0–0.1)
Basophils Relative: 0 %
Eosinophils Absolute: 0 10*3/uL (ref 0.0–0.5)
Eosinophils Relative: 2 %
HCT: 36.9 % — ABNORMAL LOW (ref 39.0–52.0)
Hemoglobin: 13.2 g/dL (ref 13.0–17.0)
Immature Granulocytes: 1 %
Lymphocytes Relative: 18 %
Lymphs Abs: 0.4 10*3/uL — ABNORMAL LOW (ref 0.7–4.0)
MCH: 29.9 pg (ref 26.0–34.0)
MCHC: 35.8 g/dL (ref 30.0–36.0)
MCV: 83.7 fL (ref 80.0–100.0)
Monocytes Absolute: 0.3 10*3/uL (ref 0.1–1.0)
Monocytes Relative: 14 %
Neutro Abs: 1.7 10*3/uL (ref 1.7–7.7)
Neutrophils Relative %: 65 %
Platelet Count: 207 10*3/uL (ref 150–400)
RBC: 4.41 MIL/uL (ref 4.22–5.81)
RDW: 14.3 % (ref 11.5–15.5)
WBC Count: 2.5 10*3/uL — ABNORMAL LOW (ref 4.0–10.5)
nRBC: 0 % (ref 0.0–0.2)

## 2020-11-02 MED ORDER — FAMOTIDINE 20 MG IN NS 100 ML IVPB
INTRAVENOUS | Status: AC
  Filled 2020-11-02: qty 100

## 2020-11-02 MED ORDER — PALONOSETRON HCL INJECTION 0.25 MG/5ML
0.2500 mg | Freq: Once | INTRAVENOUS | Status: AC
Start: 2020-11-02 — End: 2020-11-02
  Administered 2020-11-02: 0.25 mg via INTRAVENOUS

## 2020-11-02 MED ORDER — FAMOTIDINE 20 MG IN NS 100 ML IVPB
20.0000 mg | Freq: Once | INTRAVENOUS | Status: AC
Start: 2020-11-02 — End: 2020-11-02
  Administered 2020-11-02: 20 mg via INTRAVENOUS

## 2020-11-02 MED ORDER — SODIUM CHLORIDE 0.9 % IV SOLN
Freq: Once | INTRAVENOUS | Status: AC
  Filled 2020-11-02: qty 250

## 2020-11-02 MED ORDER — SODIUM CHLORIDE 0.9 % IV SOLN
20.0000 mg | Freq: Once | INTRAVENOUS | Status: AC
  Administered 2020-11-02: 20 mg via INTRAVENOUS
  Filled 2020-11-02: qty 20

## 2020-11-02 MED ORDER — SODIUM CHLORIDE 0.9 % IV SOLN
45.0000 mg/m2 | Freq: Once | INTRAVENOUS | Status: AC
  Administered 2020-11-02: 90 mg via INTRAVENOUS
  Filled 2020-11-02: qty 15

## 2020-11-02 MED ORDER — DIPHENHYDRAMINE HCL 50 MG/ML IJ SOLN
50.0000 mg | Freq: Once | INTRAMUSCULAR | Status: AC
  Administered 2020-11-02: 50 mg via INTRAVENOUS

## 2020-11-02 MED ORDER — DIPHENHYDRAMINE HCL 50 MG/ML IJ SOLN
INTRAMUSCULAR | Status: AC
  Filled 2020-11-02: qty 1

## 2020-11-02 MED ORDER — ONDANSETRON HCL 8 MG PO TABS: 8.0000 mg | ORAL_TABLET | Freq: Three times a day (TID) | ORAL | 1 refills | Status: DC | PRN

## 2020-11-02 MED ORDER — PALONOSETRON HCL INJECTION 0.25 MG/5ML
INTRAVENOUS | Status: AC
  Filled 2020-11-02: qty 5

## 2020-11-02 MED ORDER — SODIUM CHLORIDE 0.9 % IV SOLN
287.2000 mg | Freq: Once | INTRAVENOUS | Status: AC
  Administered 2020-11-02: 290 mg via INTRAVENOUS
  Filled 2020-11-02: qty 29

## 2020-11-02 NOTE — Patient Instructions (Signed)
[facility] ONCOLOGY  Discharge Instructions: Thank you for choosing [facility] to provide your oncology and hematology care.   If you have a lab appointment with the [facility], please go directly to the [facility] and check in at the registration area.   Wear comfortable clothing and clothing appropriate for easy access to any Portacath or PICC line.   We strive to give you quality time with your provider. You may need to reschedule your appointment if you arrive late (15 or more minutes).  Arriving late affects you and other patients whose appointments are after yours.  Also, if you miss three or more appointments without notifying the office, you may be dismissed from the clinic at the provider's discretion.      For prescription refill requests, have your pharmacy contact our office and allow 72 hours for refills to be completed.    Today you received the following chemotherapy and/or immunotherapy agents: Paclitaxel (Taxol), and Carboplatin.    To help prevent nausea and vomiting after your treatment, we encourage you to take your nausea medication as directed.  BELOW ARE SYMPTOMS THAT SHOULD BE REPORTED IMMEDIATELY: *FEVER GREATER THAN 100.4 F (38 C) OR HIGHER *CHILLS OR SWEATING *NAUSEA AND VOMITING THAT IS NOT CONTROLLED WITH YOUR NAUSEA MEDICATION *UNUSUAL SHORTNESS OF BREATH *UNUSUAL BRUISING OR BLEEDING *URINARY PROBLEMS (pain or burning when urinating, or frequent urination) *BOWEL PROBLEMS (unusual diarrhea, constipation, pain near the anus) TENDERNESS IN MOUTH AND THROAT WITH OR WITHOUT PRESENCE OF ULCERS (sore throat, sores in mouth, or a toothache) UNUSUAL RASH, SWELLING OR PAIN  UNUSUAL VAGINAL DISCHARGE OR ITCHING   Items with * indicate a potential emergency and should be followed up as soon as possible or go to the Emergency Department if any problems should occur.  Please show the CHEMOTHERAPY ALERT CARD or IMMUNOTHERAPY  ALERT CARD at check-in to the Emergency Department and triage nurse.  Should you have questions after your visit or need to cancel or reschedule your appointment, please contact [facility]  Dept: [phone]  and follow the prompts.  Office hours are 8:00 a.m. to 4:30 p.m. Monday - Friday. Please note that voicemails left after 4:00 p.m. may not be returned until the following business day.  We are closed weekends and major holidays. You have access to a nurse at all times for urgent questions. Please call the main number to the clinic Dept: [phone] and follow the prompts.   For any non-urgent questions, you may also contact your provider using MyChart. We now offer e-Visits for anyone 67 and older to request care online for non-urgent symptoms. For details visit [email or web address].   Also download the MyChart app! Go to the app store, search "MyChart", open the app, select [facility], and log in with your MyChart username and password.  Due to Covid, a mask is required upon entering the hospital/clinic. If you do not have a mask, one will be given to you upon arrival. For doctor visits, patients may have 1 support person aged 29 or older with them. For treatment visits, patients cannot have anyone with them due to current Covid guidelines and our immunocompromised population.

## 2020-11-02 NOTE — Telephone Encounter (Signed)
Scheduled appointment per 06/27 sch msg. Left message.

## 2020-11-03 ENCOUNTER — Telehealth: Payer: Self-pay | Admitting: Internal Medicine

## 2020-11-03 ENCOUNTER — Other Ambulatory Visit: Payer: Self-pay | Admitting: Radiation Oncology

## 2020-11-03 ENCOUNTER — Ambulatory Visit
Admission: RE | Admit: 2020-11-03 | Discharge: 2020-11-03 | Disposition: A | Discharge status: Home / Self Care | Payer: Managed Care, Other (non HMO) | Source: Ambulatory Visit | Attending: Radiation Oncology | Admitting: Radiation Oncology

## 2020-11-03 DIAGNOSIS — Z5111 Encounter for antineoplastic chemotherapy: Secondary | ICD-10-CM | POA: Diagnosis not present

## 2020-11-03 DIAGNOSIS — C3491 Malignant neoplasm of unspecified part of right bronchus or lung: Secondary | ICD-10-CM

## 2020-11-03 MED ORDER — HYDROCODONE-ACETAMINOPHEN 7.5-325 MG/15ML PO SOLN: 10.0000 mL | Freq: Four times a day (QID) | ORAL | 0 refills | Status: DC | PRN

## 2020-11-03 MED ORDER — SONAFINE EX EMUL
1.0000 | Freq: Once | CUTANEOUS | Status: AC
Start: 2020-11-03 — End: 2020-11-03
  Administered 2020-11-03: 1 via TOPICAL

## 2020-11-03 NOTE — Telephone Encounter (Signed)
Scheduled per los. Called and spoke with patient. Confirmed appt 

## 2020-11-04 ENCOUNTER — Ambulatory Visit
Admission: RE | Admit: 2020-11-04 | Discharge: 2020-11-04 | Disposition: A | Discharge status: Home / Self Care | Payer: Managed Care, Other (non HMO) | Source: Ambulatory Visit | Attending: Radiation Oncology | Admitting: Radiation Oncology

## 2020-11-04 ENCOUNTER — Other Ambulatory Visit: Payer: Self-pay

## 2020-11-04 DIAGNOSIS — Z5111 Encounter for antineoplastic chemotherapy: Secondary | ICD-10-CM | POA: Diagnosis not present

## 2020-11-05 ENCOUNTER — Ambulatory Visit
Admission: RE | Admit: 2020-11-05 | Discharge: 2020-11-05 | Disposition: A | Discharge status: Home / Self Care | Payer: Managed Care, Other (non HMO) | Source: Ambulatory Visit | Attending: Radiation Oncology | Admitting: Radiation Oncology

## 2020-11-05 DIAGNOSIS — Z5111 Encounter for antineoplastic chemotherapy: Secondary | ICD-10-CM | POA: Diagnosis not present

## 2020-11-06 ENCOUNTER — Ambulatory Visit
Admission: RE | Admit: 2020-11-06 | Discharge: 2020-11-06 | Disposition: A | Discharge status: Home / Self Care | Payer: Managed Care, Other (non HMO) | Source: Ambulatory Visit | Attending: Radiation Oncology | Admitting: Radiation Oncology

## 2020-11-06 DIAGNOSIS — Z510 Encounter for antineoplastic radiation therapy: Secondary | ICD-10-CM | POA: Diagnosis not present

## 2020-11-06 DIAGNOSIS — Z87891 Personal history of nicotine dependence: Secondary | ICD-10-CM | POA: Insufficient documentation

## 2020-11-06 DIAGNOSIS — G4700 Insomnia, unspecified: Secondary | ICD-10-CM | POA: Diagnosis not present

## 2020-11-06 DIAGNOSIS — R110 Nausea: Secondary | ICD-10-CM | POA: Diagnosis not present

## 2020-11-06 DIAGNOSIS — Z5111 Encounter for antineoplastic chemotherapy: Secondary | ICD-10-CM | POA: Insufficient documentation

## 2020-11-06 DIAGNOSIS — C3491 Malignant neoplasm of unspecified part of right bronchus or lung: Secondary | ICD-10-CM | POA: Insufficient documentation

## 2020-11-06 DIAGNOSIS — Z79899 Other long term (current) drug therapy: Secondary | ICD-10-CM | POA: Diagnosis not present

## 2020-11-06 DIAGNOSIS — E039 Hypothyroidism, unspecified: Secondary | ICD-10-CM | POA: Insufficient documentation

## 2020-11-10 ENCOUNTER — Other Ambulatory Visit: Payer: Self-pay | Admitting: Radiation Oncology

## 2020-11-10 ENCOUNTER — Ambulatory Visit
Admission: RE | Admit: 2020-11-10 | Discharge: 2020-11-10 | Disposition: A | Discharge status: Home / Self Care | Payer: Managed Care, Other (non HMO) | Source: Ambulatory Visit | Attending: Radiation Oncology | Admitting: Radiation Oncology

## 2020-11-10 ENCOUNTER — Inpatient Hospital Stay: Payer: Managed Care, Other (non HMO) | Attending: Internal Medicine

## 2020-11-10 ENCOUNTER — Inpatient Hospital Stay: Payer: Managed Care, Other (non HMO)

## 2020-11-10 ENCOUNTER — Other Ambulatory Visit: Payer: Self-pay

## 2020-11-10 VITALS — BP 123/78 | HR 91 | Temp 97.4°F | Resp 18

## 2020-11-10 DIAGNOSIS — R110 Nausea: Secondary | ICD-10-CM | POA: Insufficient documentation

## 2020-11-10 DIAGNOSIS — G4700 Insomnia, unspecified: Secondary | ICD-10-CM | POA: Insufficient documentation

## 2020-11-10 DIAGNOSIS — E039 Hypothyroidism, unspecified: Secondary | ICD-10-CM | POA: Insufficient documentation

## 2020-11-10 DIAGNOSIS — C3491 Malignant neoplasm of unspecified part of right bronchus or lung: Secondary | ICD-10-CM

## 2020-11-10 DIAGNOSIS — Z87891 Personal history of nicotine dependence: Secondary | ICD-10-CM | POA: Insufficient documentation

## 2020-11-10 DIAGNOSIS — Z5111 Encounter for antineoplastic chemotherapy: Secondary | ICD-10-CM | POA: Diagnosis not present

## 2020-11-10 DIAGNOSIS — Z79899 Other long term (current) drug therapy: Secondary | ICD-10-CM | POA: Insufficient documentation

## 2020-11-10 DIAGNOSIS — Z510 Encounter for antineoplastic radiation therapy: Secondary | ICD-10-CM | POA: Insufficient documentation

## 2020-11-10 LAB — CBC WITH DIFFERENTIAL (CANCER CENTER ONLY)
Abs Immature Granulocytes: 0.08 10*3/uL — ABNORMAL HIGH (ref 0.00–0.07)
Basophils Absolute: 0 10*3/uL (ref 0.0–0.1)
Basophils Relative: 0 %
Eosinophils Absolute: 0 10*3/uL (ref 0.0–0.5)
Eosinophils Relative: 1 %
HCT: 33.1 % — ABNORMAL LOW (ref 39.0–52.0)
Hemoglobin: 11.9 g/dL — ABNORMAL LOW (ref 13.0–17.0)
Immature Granulocytes: 3 %
Lymphocytes Relative: 17 %
Lymphs Abs: 0.4 10*3/uL — ABNORMAL LOW (ref 0.7–4.0)
MCH: 30.4 pg (ref 26.0–34.0)
MCHC: 36 g/dL (ref 30.0–36.0)
MCV: 84.4 fL (ref 80.0–100.0)
Monocytes Absolute: 0.6 10*3/uL (ref 0.1–1.0)
Monocytes Relative: 22 %
Neutro Abs: 1.5 10*3/uL — ABNORMAL LOW (ref 1.7–7.7)
Neutrophils Relative %: 57 %
Platelet Count: 222 10*3/uL (ref 150–400)
RBC: 3.92 MIL/uL — ABNORMAL LOW (ref 4.22–5.81)
RDW: 15 % (ref 11.5–15.5)
WBC Count: 2.6 10*3/uL — ABNORMAL LOW (ref 4.0–10.5)
nRBC: 0 % (ref 0.0–0.2)

## 2020-11-10 LAB — CMP (CANCER CENTER ONLY)
ALT: 21 U/L (ref 0–44)
AST: 16 U/L (ref 15–41)
Albumin: 3.2 g/dL — ABNORMAL LOW (ref 3.5–5.0)
Alkaline Phosphatase: 63 U/L (ref 38–126)
Anion gap: 6 (ref 5–15)
BUN: 14 mg/dL (ref 6–20)
CO2: 25 mmol/L (ref 22–32)
Calcium: 9.1 mg/dL (ref 8.9–10.3)
Chloride: 106 mmol/L (ref 98–111)
Creatinine: 0.82 mg/dL (ref 0.61–1.24)
GFR, Estimated: >60 mL/min (ref >60)
Glucose, Bld: 97 mg/dL (ref 70–99)
Potassium: 4 mmol/L (ref 3.5–5.1)
Sodium: 137 mmol/L (ref 135–145)
Total Bilirubin: 0.6 mg/dL (ref 0.3–1.2)
Total Protein: 7.6 g/dL (ref 6.5–8.1)

## 2020-11-10 MED ORDER — FAMOTIDINE 20 MG IN NS 100 ML IVPB
20.0000 mg | Freq: Once | INTRAVENOUS | Status: AC
  Administered 2020-11-10: 20 mg via INTRAVENOUS

## 2020-11-10 MED ORDER — SODIUM CHLORIDE 0.9 % IV SOLN
287.2000 mg | Freq: Once | INTRAVENOUS | Status: AC
  Administered 2020-11-10: 290 mg via INTRAVENOUS
  Filled 2020-11-10: qty 29

## 2020-11-10 MED ORDER — SODIUM CHLORIDE 0.9 % IV SOLN
Freq: Once | INTRAVENOUS | Status: AC
  Filled 2020-11-10: qty 250

## 2020-11-10 MED ORDER — SODIUM CHLORIDE 0.9 % IV SOLN
45.0000 mg/m2 | Freq: Once | INTRAVENOUS | Status: AC
  Administered 2020-11-10: 90 mg via INTRAVENOUS
  Filled 2020-11-10: qty 15

## 2020-11-10 MED ORDER — DIPHENHYDRAMINE HCL 50 MG/ML IJ SOLN
50.0000 mg | Freq: Once | INTRAMUSCULAR | Status: AC
  Administered 2020-11-10: 50 mg via INTRAVENOUS

## 2020-11-10 MED ORDER — PALONOSETRON HCL INJECTION 0.25 MG/5ML
0.2500 mg | Freq: Once | INTRAVENOUS | Status: AC
  Administered 2020-11-10: 0.25 mg via INTRAVENOUS

## 2020-11-10 MED ORDER — SODIUM CHLORIDE 0.9 % IV SOLN
20.0000 mg | Freq: Once | INTRAVENOUS | Status: AC
  Administered 2020-11-10: 20 mg via INTRAVENOUS
  Filled 2020-11-10: qty 20
  Filled 2020-11-10: qty 2

## 2020-11-10 MED ORDER — FAMOTIDINE 20 MG IN NS 100 ML IVPB
INTRAVENOUS | Status: AC
  Filled 2020-11-10: qty 100

## 2020-11-10 MED ORDER — DIPHENHYDRAMINE HCL 50 MG/ML IJ SOLN
INTRAMUSCULAR | Status: AC
  Filled 2020-11-10: qty 1

## 2020-11-10 MED ORDER — LIDOCAINE VISCOUS HCL 2 % MT SOLN: 10.0000 mL | OROMUCOSAL | 1 refills | Status: DC | PRN

## 2020-11-10 MED ORDER — PALONOSETRON HCL INJECTION 0.25 MG/5ML
INTRAVENOUS | Status: AC
  Filled 2020-11-10: qty 5

## 2020-11-10 NOTE — Progress Notes (Signed)
Writer called in prescription for lidocaine 2% solution to [facility] in [place] per Dr. Sondra Come. Instructions as follows:  Use as directed 10 mLs in the mouth or throat as needed for mouth pain. Mix 1 part 2% viscous lidocaine, and 1 part water. Swish and swallow 10 mL of diluted mixture, 30 minutes before meals and at bedtime (up to QID).   Empty contents of lidocaine bottle into a bigger container. Fill lidocaine bottle with water, and then pour water into larger container that now has lidocaine in it. Mix well and then measure your 31mL from this diluted mixture  Called and made patient aware.

## 2020-11-10 NOTE — Patient Instructions (Signed)
[facility] ONCOLOG[facility]  Discharge Instructions: Thank you for choosing [facility] to provide your oncology and hematology care.   If you have a lab appointment with the [facility], please go directly to the [facility] and check in at the registration area.   Wear comfortable clothing and clothing appropriate for easy access to any Portacath or PICC line.   We strive to give you quality time with your provider. You may need to reschedule your appointment if you arrive late (15 or more minutes).  Arriving late affects you and other patients whose appointments are after yours.  Also, if you miss three or more appointments without notifying the office, you may be dismissed from the clinic at the provider's discretion.      For prescription refill requests, have your pharmacy contact our office and allow 72 hours for refills to be completed.    Today you received the following chemotherapy and/or immunotherapy agents: Paclitaxel (Taxol), and Carboplatin.    To help prevent nausea and vomiting after your treatment, we encourage you to take your nausea medication as directed.  BELOW ARE SYMPTOMS THAT SHOULD BE REPORTED IMMEDIATELY: *FEVER GREATER THAN 100.4 F (38 C) OR HIGHER *CHILLS OR SWEATING *NAUSEA AND VOMITING THAT IS NOT CONTROLLED WITH YOUR NAUSEA MEDICATION *UNUSUAL SHORTNESS OF BREATH *UNUSUAL BRUISING OR BLEEDING *URINARY PROBLEMS (pain or burning when urinating, or frequent urination) *BOWEL PROBLEMS (unusual diarrhea, constipation, pain near the anus) TENDERNESS IN MOUTH AND THROAT WITH OR WITHOUT PRESENCE OF ULCERS (sore throat, sores in mouth, or a toothache) UNUSUAL RASH, SWELLING OR PAIN  UNUSUAL VAGINAL DISCHARGE OR ITCHING   Items with * indicate a potential emergency and should be followed up as soon as possible or go to the Emergency Department if any problems should occur.  Please show the CHEMOTHERAPY ALERT CARD or IMMUNOTHERAPY  ALERT CARD at check-in to the Emergency Department and triage nurse.  Should you have questions after your visit or need to cancel or reschedule your appointment, please contact [facility]  Dept: [phone]  and follow the prompts.  Office hours are 8:00 a.m. to 4:30 p.m. Monday - Friday. Please note that voicemails left after 4:00 p.m. may not be returned until the following business day.  We are closed weekends and major holidays. You have access to a nurse at all times for urgent questions. Please call the main number to the clinic Dept: [phone] and follow the prompts.   For any non-urgent questions, you may also contact your provider using MyChart. We now offer e-Visits for anyone 55 and older to request care online for non-urgent symptoms. For details visit [email or web address].   Also download the MyChart app! Go to the app store, search "MyChart", open the app, select [facility], and log in with your MyChart username and password.  Due to Covid, a mask is required upon entering the hospital/clinic. If you do not have a mask, one will be given to you upon arrival. For doctor visits, patients may have 1 support person aged 51 or older with them. For treatment visits, patients cannot have anyone with them due to current Covid guidelines and our immunocompromised population.

## 2020-11-11 ENCOUNTER — Ambulatory Visit
Admission: RE | Admit: 2020-11-11 | Discharge: 2020-11-11 | Disposition: A | Discharge status: Home / Self Care | Payer: Managed Care, Other (non HMO) | Source: Ambulatory Visit | Attending: Radiation Oncology | Admitting: Radiation Oncology

## 2020-11-11 DIAGNOSIS — Z5111 Encounter for antineoplastic chemotherapy: Secondary | ICD-10-CM | POA: Diagnosis not present

## 2020-11-12 ENCOUNTER — Other Ambulatory Visit: Payer: Self-pay

## 2020-11-12 ENCOUNTER — Encounter: Payer: Self-pay | Admitting: Radiation Oncology

## 2020-11-12 ENCOUNTER — Ambulatory Visit
Admission: RE | Admit: 2020-11-12 | Discharge: 2020-11-12 | Disposition: A | Discharge status: Home / Self Care | Payer: Managed Care, Other (non HMO) | Source: Ambulatory Visit | Attending: Radiation Oncology | Admitting: Radiation Oncology

## 2020-11-12 DIAGNOSIS — Z5111 Encounter for antineoplastic chemotherapy: Secondary | ICD-10-CM | POA: Diagnosis not present

## 2020-11-12 DIAGNOSIS — Z923 Personal history of irradiation: Secondary | ICD-10-CM

## 2020-11-12 HISTORY — DX: Personal history of irradiation: Z92.3

## 2020-12-03 ENCOUNTER — Ambulatory Visit (HOSPITAL_COMMUNITY)
Admission: RE | Admit: 2020-12-03 | Discharge: 2020-12-03 | Disposition: A | Discharge status: Home / Self Care | Payer: Managed Care, Other (non HMO) | Source: Ambulatory Visit | Attending: Physician Assistant | Admitting: Physician Assistant

## 2020-12-03 ENCOUNTER — Other Ambulatory Visit: Payer: Self-pay

## 2020-12-03 ENCOUNTER — Encounter (HOSPITAL_COMMUNITY): Payer: Self-pay

## 2020-12-03 DIAGNOSIS — C3491 Malignant neoplasm of unspecified part of right bronchus or lung: Secondary | ICD-10-CM | POA: Diagnosis present

## 2020-12-03 MED ORDER — IOHEXOL 350 MG/ML SOLN
100.0000 mL | Freq: Once | INTRAVENOUS | Status: AC | PRN
  Administered 2020-12-03: 65 mL via INTRAVENOUS

## 2020-12-03 MED ORDER — SODIUM CHLORIDE (PF) 0.9 % IJ SOLN
INTRAMUSCULAR | Status: AC
  Filled 2020-12-03: qty 50

## 2020-12-07 ENCOUNTER — Inpatient Hospital Stay: Payer: Managed Care, Other (non HMO) | Attending: Internal Medicine | Admitting: Internal Medicine

## 2020-12-07 ENCOUNTER — Other Ambulatory Visit: Payer: Self-pay

## 2020-12-07 VITALS — BP 123/87 | HR 80 | Temp 97.7°F | Resp 18 | Ht 68.0 in | Wt 200.7 lb

## 2020-12-07 DIAGNOSIS — Z5112 Encounter for antineoplastic immunotherapy: Secondary | ICD-10-CM | POA: Diagnosis not present

## 2020-12-07 DIAGNOSIS — Z7189 Other specified counseling: Secondary | ICD-10-CM | POA: Insufficient documentation

## 2020-12-07 DIAGNOSIS — C3491 Malignant neoplasm of unspecified part of right bronchus or lung: Secondary | ICD-10-CM

## 2020-12-07 DIAGNOSIS — Z79899 Other long term (current) drug therapy: Secondary | ICD-10-CM | POA: Diagnosis not present

## 2020-12-07 DIAGNOSIS — Z87891 Personal history of nicotine dependence: Secondary | ICD-10-CM | POA: Insufficient documentation

## 2020-12-07 DIAGNOSIS — E039 Hypothyroidism, unspecified: Secondary | ICD-10-CM | POA: Insufficient documentation

## 2020-12-07 NOTE — Progress Notes (Signed)
[facility] [phone]) [phone]   [phone]) [phone]  OFFICE PROGRESS NOTE  Paul Noble, MD [address] [place] [address]  DIAGNOSIS: Stage IIIb (T3, N2, M0) non-small cell lung cancer, squamous cell carcinoma presented with large right upper lobe perihilar mass with right hilar and subcarinal lymphadenopathy diagnosed in April 2022.  PRIOR THERAPY: Concurrent chemoradiation with weekly carboplatin for AUC of 2 and paclitaxel 45 Mg/M2.  Status post 8 cycles.  Last dose was given November 10, 2020  CURRENT THERAPY: Consolidation treatment with immunotherapy with Imfinzi 1500 Mg IV every 4 weeks.  First dose December 14, 2020.  INTERVAL HISTORY: Paul Mclean 60 y.o. male returns to the clinic today for follow-up visit accompanied by his wife.  The patient is feeling fine today with no concerning complaints except for cough productive of whitish sputum with mild shortness of breath but no significant chest pain or hemoptysis.  He denied having any fever or chills.  He has no nausea, vomiting, diarrhea or constipation.  He denied having any headache or visual changes.  He enjoyed few weeks at the beach during the break from the treatment.  The patient had repeat CT scan of the chest performed recently and he is here for evaluation and discussion of his scan results and treatment options.  MEDICAL HISTORY: Past Medical History:  Diagnosis Date   Hypothyroidism    Thyroid disease     ALLERGIES:  has No Known Allergies.  MEDICATIONS:  Current Outpatient Medications  Medication Sig Dispense Refill   HYDROcodone-acetaminophen (HYCET) 7.5-325 mg/15 ml solution Take 10 mLs by mouth 4 (four) times daily as needed for moderate pain. 120 mL 0   levothyroxine (SYNTHROID) 112 MCG tablet Take 112 mcg by mouth in the morning.     lidocaine (XYLOCAINE) 2 % solution Use as directed 10 mLs in the mouth or throat as needed for mouth pain. Mix 1 part 2% viscous lidocaine, and  1 part water. Swish and swallow 10 mL of diluted mixture, 30 minutes before meals and at bedtime (up to QID).   Empty contents of lidocaine bottle into a bigger container. Fill lidocaine bottle with water, and then pour water into larger container that now has lidocaine in it. Mix well and then measure your 76mL from this diluted mixture 100 mL 1   LORazepam (ATIVAN) 0.5 MG tablet Take 1 tablet (0.5 mg total) by mouth at bedtime. 20 tablet 0   ondansetron (ZOFRAN) 8 MG tablet Take 1 tablet (8 mg total) by mouth every 8 (eight) hours as needed for nausea or vomiting. Starting 3 days after treatment 30 tablet 1   prochlorperazine (COMPAZINE) 10 MG tablet Take 1 tablet (10 mg total) by mouth every 6 (six) hours as needed for nausea or vomiting. 30 tablet 0   sucralfate (CARAFATE) 1 g tablet Take 1 tablet (1 g total) by mouth 4 (four) times daily -  with meals and at bedtime. Crush and dissolve in 10 mL of warm water prior to swallowing 120 tablet 1   umeclidinium-vilanterol (ANORO ELLIPTA) 62.5-25 MCG/INH AEPB Inhale 1 puff into the lungs daily. 60 each 6   No current facility-administered medications for this visit.    SURGICAL HISTORY:  Past Surgical History:  Procedure Laterality Date   APPENDECTOMY     BRONCHIAL BIOPSY  09/01/2020   Procedure: BRONCHIAL BIOPSIES;  Surgeon: Garner Nash, DO;  Location: [facility];  Service: Pulmonary;;   BRONCHIAL BRUSHINGS  09/01/2020  Procedure: BRONCHIAL BRUSHINGS;  Surgeon: Garner Nash, DO;  Location: [facility];  Service: Pulmonary;;   BRONCHIAL NEEDLE ASPIRATION BIOPSY  09/01/2020   Procedure: BRONCHIAL NEEDLE ASPIRATION BIOPSIES;  Surgeon: Garner Nash, DO;  Location: [facility];  Service: Pulmonary;;   BRONCHIAL WASHINGS  09/01/2020   Procedure: BRONCHIAL WASHINGS;  Surgeon: Garner Nash, DO;  Location: [facility];  Service: Pulmonary;;   HERNIA REPAIR     INCISION AND DRAINAGE ABSCESS N/A 08/25/2017   Procedure: INCISION AND  DRAINAGE perineal  ABSCESS;  Surgeon: Festus Aloe, MD;  Location: [facility];  Service: Urology;  Laterality: N/A;   VIDEO BRONCHOSCOPY WITH ENDOBRONCHIAL ULTRASOUND N/A 09/01/2020   Procedure: VIDEO BRONCHOSCOPY WITH ENDOBRONCHIAL ULTRASOUND;  Surgeon: Garner Nash, DO;  Location: [facility];  Service: Pulmonary;  Laterality: N/A;    REVIEW OF SYSTEMS:  Constitutional: positive for fatigue Eyes: negative Ears, nose, mouth, throat, and face: negative Respiratory: positive for cough and dyspnea on exertion Cardiovascular: negative Gastrointestinal: negative Genitourinary:negative Integument/breast: negative Hematologic/lymphatic: negative Musculoskeletal:negative Neurological: positive for paresthesia Behavioral/Psych: negative Endocrine: negative Allergic/Immunologic: negative   PHYSICAL EXAMINATION: General appearance: alert, cooperative, fatigued, and no distress Head: Normocephalic, without obvious abnormality, atraumatic Neck: no adenopathy, no JVD, supple, symmetrical, trachea midline, and thyroid not enlarged, symmetric, no tenderness/mass/nodules Lymph nodes: Cervical, supraclavicular, and axillary nodes normal. Resp: clear to auscultation bilaterally Back: symmetric, no curvature. ROM normal. No CVA tenderness. Cardio: regular rate and rhythm, S1, S2 normal, no murmur, click, rub or gallop GI: soft, non-tender; bowel sounds normal; no masses,  no organomegaly Extremities: extremities normal, atraumatic, no cyanosis or edema Neurologic: Alert and oriented X 3, normal strength and tone. Normal symmetric reflexes. Normal coordination and gait  ECOG PERFORMANCE STATUS: 1 - Symptomatic but completely ambulatory  Blood pressure 123/87, pulse 80, temperature 97.7 F (36.5 C), temperature source Tympanic, resp. rate 18, height 5\' 8"  (1.727 m), weight 200 lb 11.2 oz (91 kg), SpO2 98 %.  LABORATORY DATA: Lab Results  Component Value Date   WBC 2.6 (L) 11/10/2020   HGB  11.9 (L) 11/10/2020   HCT 33.1 (L) 11/10/2020   MCV 84.4 11/10/2020   PLT 222 11/10/2020      Chemistry      Component Value Date/Time   NA 137 11/10/2020 0833   K 4.0 11/10/2020 0833   CL 106 11/10/2020 0833   CO2 25 11/10/2020 0833   BUN 14 11/10/2020 0833   CREATININE 0.82 11/10/2020 0833      Component Value Date/Time   CALCIUM 9.1 11/10/2020 0833   ALKPHOS 63 11/10/2020 0833   AST 16 11/10/2020 0833   ALT 21 11/10/2020 0833   BILITOT 0.6 11/10/2020 0833       RADIOGRAPHIC STUDIES: CT Chest W Contrast  Result Date: 12/03/2020 CLINICAL DATA:  Primary Cancer Type: Lung Imaging Indication: Assess response to therapy Interval therapy since last imaging? Yes Initial Cancer Diagnosis Date: 09/02/2020; Established by: Biopsy-proven Detailed Pathology: Stage IIIb non-small cell lung cancer, squamous cell carcinoma. Primary Tumor location: Right upper lobe. Surgeries: No thoracic.  Appendectomy. Chemotherapy: Yes; Ongoing? Yes; Most recent administration: 11/10/2020 Immunotherapy? No Radiation therapy? Yes; Date Range: 09/30/2020 - 11/12/2020; Target: Right lung EXAM: CT CHEST WITH CONTRAST TECHNIQUE: Multidetector CT imaging of the chest was performed during intravenous contrast administration. CONTRAST:  15mL OMNIPAQUE IOHEXOL 350 MG/ML SOLN COMPARISON:  09/02/2020 PET-CT.  CT chest 08/10/2020. FINDINGS: Cardiovascular: Normal heart size.  No pericardial effusion. Mediastinum/Nodes: Normal appearance of the thyroid gland. The trachea  appears patent and is midline. Normal appearance of the esophagus. No axillary, supraclavicular, or mediastinal adenopathy. The right hilar mass measures 4.2 x 2.7 by 2.9 cm (volume = 17 cm^3), image 72/2. Formally 6.4 by 2.8 by 3.2 cm (volume = 30 cm^3). Lungs/Pleura: Paraseptal and centrilobular emphysema.No pleural effusion, airspace consolidation, or atelectasis. Stable to improved appearance postobstructive pneumonitis within the anteromedial right upper  lobe. Previous FDG avid right upper lobe lung nodule measuring 1.2 cm has resolved in the interval. Upper Abdomen: No acute abnormality. Adrenal glands appear within normal limits. Musculoskeletal: No chest wall abnormality. No acute or significant osseous findings. IMPRESSION: 1. Interval response to therapy. The right hilar mass has decreased in size in the interval. 2. Resolution of previous FDG avid right upper lobe lung nodule. 3. Emphysema. Emphysema (ICD10-J43.9). Electronically Signed   By: Kerby Moors M.D.   On: 12/03/2020 11:42     ASSESSMENT AND PLAN: This is a very pleasant 60 years old white male recently diagnosed with stage IIIb (T3, N2, M0) non-small cell lung cancer, squamous cell carcinoma presented with large right upper lobe perihilar mass with right hilar and subcarinal lymphadenopathy diagnosed in April 2022. The patient underwent a course of concurrent chemoradiation with weekly carboplatin and paclitaxel status post 8 cycles.  The patient tolerated his treatment well except for fatigue. He had repeat CT scan of the chest performed recently.  I personally and independently reviewed the scan images and discussed the results and showed the images to the patient and his wife. His scan showed partial response with improvement in the right hilar mass as well as resolution of the right upper lobe lung nodule. I discussed with the patient his future treatment options and gave him the option of observation versus consideration of consolidation treatment with immunotherapy with Imfinzi 1500 Mg IV every 4 weeks.  I discussed with the patient the benefit of the consolidation treatment including improvement in the progression free survival as well as overall survival in 5 years. The patient is interested in proceeding with the treatment and he is expected to start the first cycle of this treatment next week. I discussed with him the adverse effect of this treatment including but not limited  to immunotherapy mediated skin rash, diarrhea, inflammation of the lung, kidney, thyroid or other endocrine dysfunction. The patient will come back for follow-up visit in 5 weeks for evaluation with the start of cycle #2. He was advised to call immediately if he has any other concerning symptoms in the interval. The patient voices understanding of current disease status and treatment options and is in agreement with the current care plan.  All questions were answered. The patient knows to call the clinic with any problems, questions or concerns. We can certainly see the patient much sooner if necessary.   Disclaimer: This note was dictated with voice recognition software. Similar sounding words can inadvertently be transcribed and may not be corrected upon review.

## 2020-12-07 NOTE — Progress Notes (Signed)
DISCONTINUE ON PATHWAY REGIMEN - Non-Small Cell Lung     Administer weekly:     Paclitaxel      Carboplatin   **Always confirm dose/schedule in your pharmacy ordering system**  REASON: Continuation Of Treatment PRIOR TREATMENT: [id number]: Carboplatin AUC=2 + Paclitaxel 45 mg/m2 Weekly During Radiation TREATMENT RESPONSE: Partial Response (PR)  START ON PATHWAY REGIMEN - Non-Small Cell Lung     A cycle is every 28 days:     Durvalumab   **Always confirm dose/schedule in your pharmacy ordering system**  Patient Characteristics: Preoperative or Nonsurgical Candidate (Clinical Staging), Stage III - Nonsurgical Candidate (Nonsquamous and Squamous), PS = 0, 1 Therapeutic Status: Preoperative or Nonsurgical Candidate (Clinical Staging) AJCC T Category: cT3 AJCC N Category: cN2 AJCC M Category: cM0 AJCC 8 Stage Grouping: IIIB ECOG Performance Status: 1 Intent of Therapy: Curative Intent, Discussed with Patient

## 2020-12-10 ENCOUNTER — Telehealth: Payer: Self-pay

## 2020-12-10 NOTE — Telephone Encounter (Signed)
Notified Mr. Plouff of completion of Disability forms. Fax transmission confirmation received and copy of forms mailed to Mr. Cadavid as requested.

## 2020-12-14 ENCOUNTER — Inpatient Hospital Stay: Payer: Managed Care, Other (non HMO)

## 2020-12-14 ENCOUNTER — Other Ambulatory Visit: Payer: Self-pay

## 2020-12-14 VITALS — BP 124/81 | HR 79 | Temp 98.1°F | Resp 18 | Wt 200.5 lb

## 2020-12-14 DIAGNOSIS — Z5112 Encounter for antineoplastic immunotherapy: Secondary | ICD-10-CM | POA: Diagnosis not present

## 2020-12-14 DIAGNOSIS — C3491 Malignant neoplasm of unspecified part of right bronchus or lung: Secondary | ICD-10-CM

## 2020-12-14 LAB — CMP (CANCER CENTER ONLY)
ALT: 26 U/L (ref 0–44)
AST: 17 U/L (ref 15–41)
Albumin: 3.7 g/dL (ref 3.5–5.0)
Alkaline Phosphatase: 70 U/L (ref 38–126)
Anion gap: 7 (ref 5–15)
BUN: 12 mg/dL (ref 6–20)
CO2: 23 mmol/L (ref 22–32)
Calcium: 9.5 mg/dL (ref 8.9–10.3)
Chloride: 108 mmol/L (ref 98–111)
Creatinine: 0.81 mg/dL (ref 0.61–1.24)
GFR, Estimated: >60 mL/min (ref >60)
Glucose, Bld: 91 mg/dL (ref 70–99)
Potassium: 4.2 mmol/L (ref 3.5–5.1)
Sodium: 138 mmol/L (ref 135–145)
Total Bilirubin: 1 mg/dL (ref 0.3–1.2)
Total Protein: 7.5 g/dL (ref 6.5–8.1)

## 2020-12-14 LAB — CBC WITH DIFFERENTIAL (CANCER CENTER ONLY)
Abs Immature Granulocytes: 0.04 10*3/uL (ref 0.00–0.07)
Basophils Absolute: 0 10*3/uL (ref 0.0–0.1)
Basophils Relative: 1 %
Eosinophils Absolute: 0.2 10*3/uL (ref 0.0–0.5)
Eosinophils Relative: 5 %
HCT: 35.1 % — ABNORMAL LOW (ref 39.0–52.0)
Hemoglobin: 12.7 g/dL — ABNORMAL LOW (ref 13.0–17.0)
Immature Granulocytes: 1 %
Lymphocytes Relative: 33 %
Lymphs Abs: 1.6 10*3/uL (ref 0.7–4.0)
MCH: 31.4 pg (ref 26.0–34.0)
MCHC: 36.2 g/dL — ABNORMAL HIGH (ref 30.0–36.0)
MCV: 86.9 fL (ref 80.0–100.0)
Monocytes Absolute: 0.7 10*3/uL (ref 0.1–1.0)
Monocytes Relative: 15 %
Neutro Abs: 2.2 10*3/uL (ref 1.7–7.7)
Neutrophils Relative %: 45 %
Platelet Count: 168 10*3/uL (ref 150–400)
RBC: 4.04 MIL/uL — ABNORMAL LOW (ref 4.22–5.81)
RDW: 16.3 % — ABNORMAL HIGH (ref 11.5–15.5)
WBC Count: 4.8 10*3/uL (ref 4.0–10.5)
nRBC: 0 % (ref 0.0–0.2)

## 2020-12-14 LAB — TSH: TSH: 0.093 u[IU]/mL — ABNORMAL LOW (ref 0.320–4.118)

## 2020-12-14 MED ORDER — SODIUM CHLORIDE 0.9 % IV SOLN
1500.0000 mg | Freq: Once | INTRAVENOUS | Status: AC
  Administered 2020-12-14: 1500 mg via INTRAVENOUS
  Filled 2020-12-14: qty 30

## 2020-12-14 MED ORDER — SODIUM CHLORIDE 0.9 % IV SOLN
Freq: Once | INTRAVENOUS | Status: AC
  Filled 2020-12-14: qty 250

## 2020-12-14 NOTE — Patient Instructions (Signed)
[facility]  Discharge Instructions: Thank you for choosing [facility] to provide your oncology and hematology care.   If you have a lab appointment with the [facility], please go directly to the [facility] and check in at the registration area.   Wear comfortable clothing and clothing appropriate for easy access to any Portacath or PICC line.   We strive to give you quality time with your provider. You may need to reschedule your appointment if you arrive late (15 or more minutes).  Arriving late affects you and other patients whose appointments are after yours.  Also, if you miss three or more appointments without notifying the office, you may be dismissed from the clinic at the provider's discretion.      For prescription refill requests, have your pharmacy contact our office and allow 72 hours for refills to be completed.    Today you received the following chemotherapy and/or immunotherapy agents iminfzi      To help prevent nausea and vomiting after your treatment, we encourage you to take your nausea medication as directed.  BELOW ARE SYMPTOMS THAT SHOULD BE REPORTED IMMEDIATELY: *FEVER GREATER THAN 100.4 F (38 C) OR HIGHER *CHILLS OR SWEATING *NAUSEA AND VOMITING THAT IS NOT CONTROLLED WITH YOUR NAUSEA MEDICATION *UNUSUAL SHORTNESS OF BREATH *UNUSUAL BRUISING OR BLEEDING *URINARY PROBLEMS (pain or burning when urinating, or frequent urination) *BOWEL PROBLEMS (unusual diarrhea, constipation, pain near the anus) TENDERNESS IN MOUTH AND THROAT WITH OR WITHOUT PRESENCE OF ULCERS (sore throat, sores in mouth, or a toothache) UNUSUAL RASH, SWELLING OR PAIN  UNUSUAL VAGINAL DISCHARGE OR ITCHING   Items with * indicate a potential emergency and should be followed up as soon as possible or go to the Emergency Department if any problems should occur.  Please show the CHEMOTHERAPY ALERT CARD or IMMUNOTHERAPY ALERT CARD at check-in to the  Emergency Department and triage nurse.  Should you have questions after your visit or need to cancel or reschedule your appointment, please contact [facility]  Dept: [phone]  and follow the prompts.  Office hours are 8:00 a.m. to 4:30 p.m. Monday - Friday. Please note that voicemails left after 4:00 p.m. may not be returned until the following business day.  We are closed weekends and major holidays. You have access to a nurse at all times for urgent questions. Please call the main number to the clinic Dept: [phone] and follow the prompts.   For any non-urgent questions, you may also contact your provider using MyChart. We now offer e-Visits for anyone 38 and older to request care online for non-urgent symptoms. For details visit [email or web address].   Also download the MyChart app! Go to the app store, search "MyChart", open the app, select [facility], and log in with your MyChart username and password.  Due to Covid, a mask is required upon entering the hospital/clinic. If you do not have a mask, one will be given to you upon arrival. For doctor visits, patients may have 1 support person aged 56 or older with them. For treatment visits, patients cannot have anyone with them due to current Covid guidelines and our immunocompromised population.   Durvalumab injection What is this medication? DURVALUMAB (dur VAL ue mab) is a monoclonal antibody. It is used to treat lungcancer. This medicine may be used for other purposes; ask your health care provider orpharmacist if you have questions. COMMON BRAND NAME(S): IMFINZI What should I tell my care  team before I take this medication? They need to know if you have any of these conditions: autoimmune diseases like Crohn's disease, ulcerative colitis, or lupus have had or planning to have an allogeneic stem cell transplant (uses someone else's stem cells) history of organ transplant history of radiation to the  chest nervous system problems like myasthenia gravis or Guillain-Barre syndrome an unusual or allergic reaction to durvalumab, other medicines, foods, dyes, or preservatives pregnant or trying to get pregnant breast-feeding How should I use this medication? This medicine is for infusion into a vein. It is given by a health careprofessional in a hospital or clinic setting. A special MedGuide will be given to you before each treatment. Be sure to readthis information carefully each time. Talk to your pediatrician regarding the use of this medicine in children.Special care may be needed. Overdosage: If you think you have taken too much of this medicine contact apoison control center or emergency room at once. NOTE: This medicine is only for you. Do not share this medicine with others. What if I miss a dose? It is important not to miss your dose. Call your doctor or health careprofessional if you are unable to keep an appointment. What may interact with this medication? Interactions have not been studied. This list may not describe all possible interactions. Give your health care provider a list of all the medicines, herbs, non-prescription drugs, or dietary supplements you use. Also tell them if you smoke, drink alcohol, or use illegaldrugs. Some items may interact with your medicine. What should I watch for while using this medication? This drug may make you feel generally unwell. Continue your course of treatmenteven though you feel ill unless your doctor tells you to stop. You may need blood work done while you are taking this medicine. Do not become pregnant while taking this medicine or for 3 months after stopping it. Women should inform their doctor if they wish to become pregnant or think they might be pregnant. There is a potential for serious side effects to an unborn child. Talk to your health care professional or pharmacist for more information. Do not breast-feed an infant while taking  this medicine orfor 3 months after stopping it. What side effects may I notice from receiving this medication? Side effects that you should report to your doctor or health care professionalas soon as possible: allergic reactions like skin rash, itching or hives, swelling of the face, lips, or tongue black, tarry stools bloody or watery diarrhea breathing problems change in emotions or moods change in sex drive changes in vision chest pain or chest tightness chills confusion cough facial flushing fever headache signs and symptoms of high blood sugar such as dizziness; dry mouth; dry skin; fruity breath; nausea; stomach pain; increased hunger or thirst; increased urination signs and symptoms of liver injury like dark yellow or brown urine; general ill feeling or flu-like symptoms; light-colored stools; loss of appetite; nausea; right upper belly pain; unusually weak or tired; yellowing of the eyes or skin stomach pain trouble passing urine or change in the amount of urine weight gain or weight loss Side effects that usually do not require medical attention (report these toyour doctor or health care professional if they continue or are bothersome): bone pain constipation loss of appetite muscle pain nausea swelling of the ankles, feet, hands tiredness This list may not describe all possible side effects. Call your doctor for medical advice about side effects. You may report side effects to FDA at1-800-FDA-1088. Where should  I keep my medication? This drug is given in a hospital or clinic and will not be stored at home. NOTE: This sheet is a summary. It may not cover all possible information. If you have questions about this medicine, talk to your doctor, pharmacist, orhealth care provider.  2022 Elsevier/Gold Standard (2019-07-04 13:01:29)

## 2020-12-15 ENCOUNTER — Encounter: Payer: Self-pay | Admitting: Radiology

## 2020-12-16 ENCOUNTER — Telehealth: Payer: Self-pay | Admitting: *Deleted

## 2020-12-16 NOTE — Telephone Encounter (Signed)
-----   Message from Willia Craze, RN sent at 12/14/2020  3:38 PM EDT ----- Regarding: mohamed first time imfinzi Pt was first time imfinzi on 12/14/20 and tolerated treatment well. Will need a call back, thanks!

## 2020-12-16 NOTE — Telephone Encounter (Signed)
Called pt to see how he did with his recent treatment.  Left message for pt to return call.

## 2020-12-18 ENCOUNTER — Encounter: Payer: Self-pay | Admitting: Internal Medicine

## 2020-12-23 NOTE — Progress Notes (Incomplete)
  Radiation Oncology         [phone] ________________________________  Patient Name: Paul Mclean MRN: [id number] DOB: [birth year] Referring Physician: Curt Bears (Profile Not Attached) Date of Service: 11/12/2020 [facility] [place]  End Of Treatment Note  Diagnoses: C34.11-Malignant neoplasm of upper lobe, right bronchus or lung  Cancer Staging: Stage III squamous cell carcinoma of right lung   Intent: Curative  Radiation Treatment Dates: 09/30/2020 through 11/12/2020 Site Technique Total Dose (Gy) Dose per Fx (Gy) Completed Fx Beam Energies  Lung, Right: Lung_Rt 3D 60/60 2 30/30 6X, 10X   Narrative: The patient tolerated radiation therapy relatively well. He reports having a good overall energy level. He does report having pain to throat rated at a 6/10, as well as issues swallowing and dry cough. On physical exam; patient has some mild erythema to the front part of his chest, no skin breakdown noted.   He continues to have significant esophagitis.  He is on Hycet as well as Carafate.  He would like to add additional medication and will give him diluted viscous Xylocaine to take prior to meals and at bedtime  Plan: The patient will follow-up with radiation oncology in one month .  ________________________________________________ -----------------------------------  Blair Promise, PhD, MD  This document serves as a record of services personally performed by Gery Pray, MD. It was created on his behalf by Roney Mans, a trained medical scribe. The creation of this record is based on the scribe's personal observations and the provider's statements to them. This document has been checked and approved by the attending provider.

## 2020-12-23 NOTE — Progress Notes (Signed)
Radiation Oncology         [phone] ________________________________  Name: Paul Mclean MRN: [id number]  Date: 12/24/2020  DOB: 01/16/1961  Follow-Up Visit Note  CC: [name], MD  Curt Bears, MD    ICD-10-CM   1. Stage III squamous cell carcinoma of right lung [facility])  C34.91       Diagnosis: Stage III squamous cell carcinoma of right lung   Interval Since Last Radiation:  1 month and 11 days  Intent: Curative  Radiation Treatment Dates: 09/30/2020 through 11/12/2020 Site Technique Total Dose (Gy) Dose per Fx (Gy) Completed Fx Beam Energies  Lung, Right: Lung_Rt 3D 60/60 2 30/30 6X, 10X   Narrative:  The patient returns today for routine follow-up. The patient tolerated his recent radiation treatment relatively well. He continued to have significant esophagitis at the end of treatment; taking Hycet as well as Carafate. He reported throat pain rated at a 6/10, as well as issues swallowing and dry cough. He informed me that he wanted to add additional medication for which I gave him diluted viscous Xylocaine to take prior to meals and at bedtime. Patient additionally had some mild erythema to the front of his chest near the end of treatment (no skin break down noted).   Pertinent imaging since the patient was last seen includes a chest CT on 12/03/20 demonstrating an interval response to therapy; showing the right hilar mass to have decreased in size. Additionally seen was resolution of the previously seen FDG avid right upper lobe lung nodule. Emphysema was of note as well.  The patient most recently followed up with Dr. Julien Nordmann on 12/07/20 to discuss recent CT scan. During this time, the patient was noted to be doing well other than reports of a cough; productive of whitish sputum with mild shortness of breath. Patient denied chest pain, hemoptysis, fever, chills, nausea, vomiting, headaches, visual changes, diarrhea or constipation. Dr. Julien Nordmann discussed further treatment  options with the patient. Patient opted to proceed with immunotherapy treatment consisting of Imfinzi 1500 Mg IV every 4 weeks.  He continues to have some fatigue since completion of his treatment.  He has some dyspnea on exertion but no chest pain or hemoptysis.  He continues to have some mild intermittent esophageal symptoms.  He is having significant problems with insomnia which is contributing to his ongoing fatigue.  He is requesting limited refill of his Ambien.                              Allergies:  has No Known Allergies.  Meds: Current Outpatient Medications  Medication Sig Dispense Refill   levothyroxine (SYNTHROID) 112 MCG tablet Take 112 mcg by mouth in the morning.     zolpidem (AMBIEN) 5 MG tablet Take 5 mg by mouth at bedtime as needed.     HYDROcodone-acetaminophen (HYCET) 7.5-325 mg/15 ml solution Take 10 mLs by mouth 4 (four) times daily as needed for moderate pain. (Patient not taking: Reported on 12/24/2020) 120 mL 0   lidocaine (XYLOCAINE) 2 % solution Use as directed 10 mLs in the mouth or throat as needed for mouth pain. Mix 1 part 2% viscous lidocaine, and 1 part water. Swish and swallow 10 mL of diluted mixture, 30 minutes before meals and at bedtime (up to QID).   Empty contents of lidocaine bottle into a bigger container. Fill lidocaine bottle with water, and then pour water into larger container that now  has lidocaine in it. Mix well and then measure your 47mL from this diluted mixture (Patient not taking: Reported on 12/24/2020) 100 mL 1   LORazepam (ATIVAN) 0.5 MG tablet Take 1 tablet (0.5 mg total) by mouth at bedtime. (Patient not taking: Reported on 12/24/2020) 20 tablet 0   ondansetron (ZOFRAN) 8 MG tablet Take 1 tablet (8 mg total) by mouth every 8 (eight) hours as needed for nausea or vomiting. Starting 3 days after treatment (Patient not taking: Reported on 12/24/2020) 30 tablet 1   prochlorperazine (COMPAZINE) 10 MG tablet Take 1 tablet (10 mg total) by mouth  every 6 (six) hours as needed for nausea or vomiting. (Patient not taking: Reported on 12/24/2020) 30 tablet 0   sucralfate (CARAFATE) 1 g tablet Take 1 tablet (1 g total) by mouth 4 (four) times daily -  with meals and at bedtime. Crush and dissolve in 10 mL of warm water prior to swallowing (Patient not taking: Reported on 12/24/2020) 120 tablet 1   umeclidinium-vilanterol (ANORO ELLIPTA) 62.5-25 MCG/INH AEPB Inhale 1 puff into the lungs daily. (Patient not taking: Reported on 12/24/2020) 60 each 6   No current facility-administered medications for this encounter.    Physical Findings: The patient is in no acute distress. Patient is alert and oriented.  height is 5\' 8"  (1.727 m) and weight is 200 lb 8 oz (90.9 kg). His temporal temperature is 97.7 F (36.5 C). His blood pressure is 138/92 (abnormal) and his pulse is 85. His respiration is 18 and oxygen saturation is 100%. .  No significant changes. Lungs are clear to auscultation bilaterally. Heart has regular rate and rhythm. No palpable cervical, supraclavicular, or axillary adenopathy. Abdomen soft, non-tender, normal bowel sounds.    Lab Findings: Lab Results  Component Value Date   WBC 4.8 12/14/2020   HGB 12.7 (L) 12/14/2020   HCT 35.1 (L) 12/14/2020   MCV 86.9 12/14/2020   PLT 168 12/14/2020    Radiographic Findings: CT Chest W Contrast  Result Date: 12/03/2020 CLINICAL DATA:  Primary Cancer Type: Lung Imaging Indication: Assess response to therapy Interval therapy since last imaging? Yes Initial Cancer Diagnosis Date: 09/02/2020; Established by: Biopsy-proven Detailed Pathology: Stage IIIb non-small cell lung cancer, squamous cell carcinoma. Primary Tumor location: Right upper lobe. Surgeries: No thoracic.  Appendectomy. Chemotherapy: Yes; Ongoing? Yes; Most recent administration: 11/10/2020 Immunotherapy? No Radiation therapy? Yes; Date Range: 09/30/2020 - 11/12/2020; Target: Right lung EXAM: CT CHEST WITH CONTRAST TECHNIQUE:  Multidetector CT imaging of the chest was performed during intravenous contrast administration. CONTRAST:  28mL OMNIPAQUE IOHEXOL 350 MG/ML SOLN COMPARISON:  09/02/2020 PET-CT.  CT chest 08/10/2020. FINDINGS: Cardiovascular: Normal heart size.  No pericardial effusion. Mediastinum/Nodes: Normal appearance of the thyroid gland. The trachea appears patent and is midline. Normal appearance of the esophagus. No axillary, supraclavicular, or mediastinal adenopathy. The right hilar mass measures 4.2 x 2.7 by 2.9 cm (volume = 17 cm^3), image 72/2. Formally 6.4 by 2.8 by 3.2 cm (volume = 30 cm^3). Lungs/Pleura: Paraseptal and centrilobular emphysema.No pleural effusion, airspace consolidation, or atelectasis. Stable to improved appearance postobstructive pneumonitis within the anteromedial right upper lobe. Previous FDG avid right upper lobe lung nodule measuring 1.2 cm has resolved in the interval. Upper Abdomen: No acute abnormality. Adrenal glands appear within normal limits. Musculoskeletal: No chest wall abnormality. No acute or significant osseous findings. IMPRESSION: 1. Interval response to therapy. The right hilar mass has decreased in size in the interval. 2. Resolution of previous FDG avid right upper lobe  lung nodule. 3. Emphysema. Emphysema (ICD10-J43.9). Electronically Signed   By: Kerby Moors M.D.   On: 12/03/2020 11:42    Impression:  Stage III squamous cell carcinoma of right lung  The patient is recovering from the effects of radiation.  Recent chest CT scan shows response to his radiation therapy and radiosensitizing chemotherapy.  Plan: Patient will continue on immunotherapy as above.  We will prescribe a limited Rx of Ambien in light of his insomnia.  As needed follow-up in radiation oncology in light of his close follow-up with medical oncology.   _______________________  Blair Promise, PhD, MD   This document serves as a record of services personally performed by Gery Pray, MD.  It was created on his behalf by Roney Mans, a trained medical scribe. The creation of this record is based on the scribe's personal observations and the provider's statements to them. This document has been checked and approved by the attending provider.

## 2020-12-24 ENCOUNTER — Other Ambulatory Visit: Payer: Self-pay

## 2020-12-24 ENCOUNTER — Ambulatory Visit
Admission: RE | Admit: 2020-12-24 | Discharge: 2020-12-24 | Disposition: A | Discharge status: Home / Self Care | Payer: Managed Care, Other (non HMO) | Source: Ambulatory Visit | Attending: Radiation Oncology | Admitting: Radiation Oncology

## 2020-12-24 ENCOUNTER — Encounter: Payer: Self-pay | Admitting: Radiation Oncology

## 2020-12-24 VITALS — BP 138/92 | HR 85 | Temp 97.7°F | Resp 18 | Ht 68.0 in | Wt 200.5 lb

## 2020-12-24 DIAGNOSIS — R070 Pain in throat: Secondary | ICD-10-CM | POA: Diagnosis not present

## 2020-12-24 DIAGNOSIS — C3411 Malignant neoplasm of upper lobe, right bronchus or lung: Secondary | ICD-10-CM | POA: Diagnosis present

## 2020-12-24 DIAGNOSIS — J432 Centrilobular emphysema: Secondary | ICD-10-CM | POA: Insufficient documentation

## 2020-12-24 DIAGNOSIS — G4700 Insomnia, unspecified: Secondary | ICD-10-CM | POA: Diagnosis not present

## 2020-12-24 DIAGNOSIS — Z923 Personal history of irradiation: Secondary | ICD-10-CM | POA: Insufficient documentation

## 2020-12-24 DIAGNOSIS — K209 Esophagitis, unspecified without bleeding: Secondary | ICD-10-CM | POA: Insufficient documentation

## 2020-12-24 DIAGNOSIS — F17210 Nicotine dependence, cigarettes, uncomplicated: Secondary | ICD-10-CM | POA: Diagnosis not present

## 2020-12-24 DIAGNOSIS — C3491 Malignant neoplasm of unspecified part of right bronchus or lung: Secondary | ICD-10-CM

## 2020-12-24 MED ORDER — ZOLPIDEM TARTRATE 5 MG PO TABS: 5.0000 mg | ORAL_TABLET | Freq: Every evening | ORAL | 0 refills | Status: DC | PRN

## 2020-12-24 NOTE — Progress Notes (Signed)
[name] is here today for follow up post radiation to the lung.  Lung Side: right  Does the patient complain of any of the following: Pain:5/10 bilateral hand and leg pain Shortness of breath w/wo exertion: shortness of breath when active and at rest Cough: productive cough with clear phlegm Hemoptysis: denies Pain with swallowing: occasionally Swallowing/choking concerns: occasionally Appetite: fair Energy Level: poor, lacks motivation Post radiation skin Changes: resolved    Additional comments if applicable: difficulty sleeping, wet gurgle at night. Advised patient to resume Ativan for sleep and carafate for difficulty swallowing.  Vitals:   12/24/20 0842  BP: (!) 138/92  Pulse: 85  Resp: 18  Temp: 97.7 F (36.5 C)  TempSrc: Temporal  SpO2: 100%  Weight: 200 lb 8 oz (90.9 kg)  Height: 5\' 8"  (1.727 m)

## 2021-01-07 NOTE — Progress Notes (Signed)
[facility] OFFICE PROGRESS NOTE  Paul Noble, MD [address] [place] [address]  DIAGNOSIS: Stage IIIb (T3, N2, M0) non-small cell lung cancer, squamous cell carcinoma presented with large right upper lobe perihilar mass with right hilar and subcarinal lymphadenopathy diagnosed in April 2022.   PRIOR THERAPY: Concurrent chemoradiation with weekly carboplatin for AUC of 2 and paclitaxel 45 Mg/M2.  Status post 8 cycles. Last dose given July 5th, 2022.   CURRENT THERAPY: Consolidation treatment with immunotherapy with Imfinzi 1500 Mg IV every 4 weeks.  First dose December 14, 2020.   INTERVAL HISTORY: Paul Mclean 60 y.o. male returns to the clinic today for a follow-up visit accompanied by his wife. Last month, he completed his first cycle of consolidation immunotherapy with Imfinzi.  He tolerated this well without any concerning adverse side effects; however, he has multiple concerns today. He reportedly has been falling frequently. His wife thinks that he is dragging/shuffling his feet. Denies headaches or seizure activity. He also notes some blurry vision but this pre-dated his lung cancer diagnosis. He is going to make a follow up with his eye doctor soon. Despite completing chemotherapy, he has had some persistent nausea/vomiting a few times a week. He has not been taking his anti-emetic.   Today, he denies any fever, chills, or unexplained weight loss. He also reports fatigue. He notes a decreased appetite but his weight is roughly the same. He reports night sweats. He has a baseline cough which is similar which he produces whitish sputum. He does not take anything for his cough. He reports progressive/cumulative worsening shortness of breath through the course of his treatments. He had a restaging CT at his last appointment which showed improvement in his disease. He is prescribed an inhaler to use daily but has not been using this at all. He denies any chest pain or  hemoptysis. He reports some ongoing diarrhea about 1-2x per day but denies abdominal pain or blood in the stool. He has not taken imodium for this. He denies any rashes or skin changes.  The patient is here today for evaluation and repeat blood work before starting cycle #2.    MEDICAL HISTORY: Past Medical History:  Diagnosis Date   History of radiation therapy 11/12/2020   right lung  09/30/2020-11/12/2020  Dr Gery Pray   Hypothyroidism    Thyroid disease     ALLERGIES:  has No Known Allergies.  MEDICATIONS:  Current Outpatient Medications  Medication Sig Dispense Refill   HYDROcodone-acetaminophen (HYCET) 7.5-325 mg/15 ml solution Take 10 mLs by mouth 4 (four) times daily as needed for moderate pain. (Patient not taking: Reported on 12/24/2020) 120 mL 0   levothyroxine (SYNTHROID) 112 MCG tablet Take 112 mcg by mouth in the morning.     lidocaine (XYLOCAINE) 2 % solution Use as directed 10 mLs in the mouth or throat as needed for mouth pain. Mix 1 part 2% viscous lidocaine, and 1 part water. Swish and swallow 10 mL of diluted mixture, 30 minutes before meals and at bedtime (up to QID).   Empty contents of lidocaine bottle into a bigger container. Fill lidocaine bottle with water, and then pour water into larger container that now has lidocaine in it. Mix well and then measure your 26mL from this diluted mixture (Patient not taking: Reported on 12/24/2020) 100 mL 1   LORazepam (ATIVAN) 0.5 MG tablet Take 1 tablet (0.5 mg total) by mouth at bedtime. (Patient not taking: Reported on 12/24/2020) 20  tablet 0   ondansetron (ZOFRAN) 8 MG tablet Take 1 tablet (8 mg total) by mouth every 8 (eight) hours as needed for nausea or vomiting. 30 tablet 1   prochlorperazine (COMPAZINE) 10 MG tablet Take 1 tablet (10 mg total) by mouth every 6 (six) hours as needed for nausea or vomiting. 30 tablet 2   sucralfate (CARAFATE) 1 g tablet Take 1 tablet (1 g total) by mouth 4 (four) times daily -  with meals and  at bedtime. Crush and dissolve in 10 mL of warm water prior to swallowing (Patient not taking: Reported on 12/24/2020) 120 tablet 1   umeclidinium-vilanterol (ANORO ELLIPTA) 62.5-25 MCG/INH AEPB Inhale 1 puff into the lungs daily. (Patient not taking: Reported on 12/24/2020) 60 each 6   zolpidem (AMBIEN) 5 MG tablet Take 1 tablet (5 mg total) by mouth at bedtime as needed for sleep. 30 tablet 0   No current facility-administered medications for this visit.    SURGICAL HISTORY:  Past Surgical History:  Procedure Laterality Date   APPENDECTOMY     BRONCHIAL BIOPSY  09/01/2020   Procedure: BRONCHIAL BIOPSIES;  Surgeon: Garner Nash, DO;  Location: [facility];  Service: Pulmonary;;   BRONCHIAL BRUSHINGS  09/01/2020   Procedure: BRONCHIAL BRUSHINGS;  Surgeon: Garner Nash, DO;  Location: [facility];  Service: Pulmonary;;   BRONCHIAL NEEDLE ASPIRATION BIOPSY  09/01/2020   Procedure: BRONCHIAL NEEDLE ASPIRATION BIOPSIES;  Surgeon: Garner Nash, DO;  Location: [facility];  Service: Pulmonary;;   BRONCHIAL WASHINGS  09/01/2020   Procedure: BRONCHIAL WASHINGS;  Surgeon: Garner Nash, DO;  Location: [facility];  Service: Pulmonary;;   HERNIA REPAIR     INCISION AND DRAINAGE ABSCESS N/A 08/25/2017   Procedure: INCISION AND DRAINAGE perineal  ABSCESS;  Surgeon: Festus Aloe, MD;  Location: [facility];  Service: Urology;  Laterality: N/A;   VIDEO BRONCHOSCOPY WITH ENDOBRONCHIAL ULTRASOUND N/A 09/01/2020   Procedure: VIDEO BRONCHOSCOPY WITH ENDOBRONCHIAL ULTRASOUND;  Surgeon: Garner Nash, DO;  Location: [facility];  Service: Pulmonary;  Laterality: N/A;    REVIEW OF SYSTEMS:   Review of Systems  Constitutional: Positive for fatigue and decreased appetite. Negative for chills and fever. HENT: Negative for mouth sores, nosebleeds, sore throat and trouble swallowing.   Eyes: Negative for eye problems and icterus.  Respiratory: Positive for cough and shortness of breath with  exertion. Negative for hemoptysis and wheezing.   Cardiovascular: Negative for chest pain and leg swelling.  Gastrointestinal: Positive for intermittent nausea/vomiting and diarrhea. Negative for abdominal pain and constipation. Genitourinary: Negative for bladder incontinence, difficulty urinating, dysuria, frequency and hematuria.   Musculoskeletal: Positive for diffuse joint pain. Negative for back pain, gait problem, neck pain and neck stiffness.  Skin: Negative for itching and rash.  Neurological: Positive for frequent falls. Negative for dizziness, extremity weakness, gait problem, headaches, light-headedness and seizures.  Hematological: Negative for adenopathy. Does not bruise/bleed easily.  Psychiatric/Behavioral: Negative for confusion, depression and sleep disturbance. The patient is not nervous/anxious.     PHYSICAL EXAMINATION:  Blood pressure (!) 129/98, pulse (!) 102, temperature 97.7 F (36.5 C), temperature source Oral, resp. rate 18, weight 199 lb 4.8 oz (90.4 kg), SpO2 99 %.  ECOG PERFORMANCE STATUS: 1  Physical Exam  Constitutional: Oriented to person, place, and time and well-developed, well-nourished, and in no distress.  HENT:  Head: Normocephalic and atraumatic.  Mouth/Throat: Oropharynx is clear and moist. No oropharyngeal exudate.  Eyes: Conjunctivae are normal. Right eye exhibits no discharge. Left  eye exhibits no discharge. No scleral icterus.  Neck: Normal range of motion. Neck supple.  Cardiovascular: Normal rate, regular rhythm, normal heart sounds and intact distal pulses.   Pulmonary/Chest: Effort normal and breath sounds normal. No respiratory distress. No wheezes. No rales.  Abdominal: Soft. Bowel sounds are normal. Exhibits no distension and no mass. There is no tenderness.  Musculoskeletal: Normal range of motion. Exhibits no edema.  Lymphadenopathy:    No cervical adenopathy.  Neurological: Alert and oriented to person, place, and time. Exhibits  normal muscle tone. Gait normal. Coordination normal. Some bilateral and symmetric weakness in hands. Trouble tracking finger . Otherwise, cranial nerves II-XII grossly intact.  Skin: Skin is warm and dry. No rash noted. Not diaphoretic. No erythema. No pallor.  Psychiatric: Mood, memory and judgment normal.  Vitals reviewed.  LABORATORY DATA: Lab Results  Component Value Date   WBC 5.0 01/12/2021   HGB 14.2 01/12/2021   HCT 40.1 01/12/2021   MCV 88.3 01/12/2021   PLT 197 01/12/2021      Chemistry      Component Value Date/Time   NA 141 01/12/2021 0839   K 4.4 01/12/2021 0839   CL 108 01/12/2021 0839   CO2 22 01/12/2021 0839   BUN 8 01/12/2021 0839   CREATININE 0.79 01/12/2021 0839      Component Value Date/Time   CALCIUM 9.5 01/12/2021 0839   ALKPHOS 71 01/12/2021 0839   AST 26 01/12/2021 0839   ALT 38 01/12/2021 0839   BILITOT 0.7 01/12/2021 0839       RADIOGRAPHIC STUDIES:  No results found.   ASSESSMENT/PLAN:  This is a very pleasant 60 year old Caucasian male diagnosed with stage IIIb (T3, N2, M0) non-small cell lung cancer, squamous cell carcinoma.  The patient presented with a large right upper lobe perihilar mass with right hilar and subcarinal lymphadenopathy.  The patient was diagnosed in April 2022.  The patient completed a course of concurrent chemoradiation with weekly carboplatin and paclitaxel.  The patient is currently status post 8 cycles. His last dose was on 11/10/20.  The patient is currently undergoing consolidation immunotherapy with Imfinzi 1500 mg IV every 4 weeks.  He is status post 1 cycle.  I reviewed his symptoms with Dr. Julien Nordmann. We will order a brain MRI to evaluate and rule out metastatic disease to the brain given his frequent falls, nausea, and vomiting. Labs were reviewed.  Recommend that he proceed with cycle #2 today scheduled   We will see him back for follow-up visit in 4 weeks for evaluation before starting cycle #4.  Regarding  the diarrhea, he was advised to use imodium.   For his cough and shortness of breath, he has not been using his prescribed inhaler. Advised to use his inhaler as prescribed and use OTC cough medication if needed such as delsym or robitussin. If continued worsening symptoms, he was advised to call back sooner for re-evaluation.   He notes some joint pain in several joints. He has not tried taking anything for this. Advised to use tylenol or ibuprofen if needed.   The patient was advised to call immediately if he has any concerning symptoms in the interval. The patient voices understanding of current disease status and treatment options and is in agreement with the current care plan. All questions were answered. The patient knows to call the clinic with any problems, questions or concerns. We can certainly see the patient much sooner if necessary  Orders Placed This Encounter  Procedures   MR Brain W Wo Contrast    Standing Status:   Future    Standing Expiration Date:   01/12/2022    Order Specific Question:   If indicated for the ordered procedure, I authorize the administration of contrast media per Radiology protocol    Answer:   Yes    Order Specific Question:   What is the patient's sedation requirement?    Answer:   No Sedation    Order Specific Question:   Does the patient have a pacemaker or implanted devices?    Answer:   No    Order Specific Question:   Use SRS Protocol?    Answer:   No    Order Specific Question:   Preferred imaging location?    Answer:   [facility] (table limit - 550 lbs)      The total time spent in the appointment was 30-39 minutes  Paul Portilla L Niels Cranshaw, PA-C 01/12/21

## 2021-01-12 ENCOUNTER — Inpatient Hospital Stay: Payer: Managed Care, Other (non HMO) | Attending: Internal Medicine | Admitting: Physician Assistant

## 2021-01-12 ENCOUNTER — Inpatient Hospital Stay: Payer: Managed Care, Other (non HMO)

## 2021-01-12 ENCOUNTER — Other Ambulatory Visit: Payer: Self-pay

## 2021-01-12 ENCOUNTER — Encounter: Payer: Self-pay | Admitting: Physician Assistant

## 2021-01-12 VITALS — BP 129/98 | HR 102 | Temp 97.7°F | Resp 18 | Wt 199.3 lb

## 2021-01-12 VITALS — BP 140/89 | HR 96

## 2021-01-12 DIAGNOSIS — G4700 Insomnia, unspecified: Secondary | ICD-10-CM | POA: Diagnosis not present

## 2021-01-12 DIAGNOSIS — R296 Repeated falls: Secondary | ICD-10-CM | POA: Insufficient documentation

## 2021-01-12 DIAGNOSIS — Z5112 Encounter for antineoplastic immunotherapy: Secondary | ICD-10-CM | POA: Diagnosis present

## 2021-01-12 DIAGNOSIS — R197 Diarrhea, unspecified: Secondary | ICD-10-CM

## 2021-01-12 DIAGNOSIS — Z87891 Personal history of nicotine dependence: Secondary | ICD-10-CM | POA: Insufficient documentation

## 2021-01-12 DIAGNOSIS — C3491 Malignant neoplasm of unspecified part of right bronchus or lung: Secondary | ICD-10-CM

## 2021-01-12 DIAGNOSIS — Z79899 Other long term (current) drug therapy: Secondary | ICD-10-CM | POA: Diagnosis not present

## 2021-01-12 DIAGNOSIS — R110 Nausea: Secondary | ICD-10-CM | POA: Insufficient documentation

## 2021-01-12 DIAGNOSIS — R112 Nausea with vomiting, unspecified: Secondary | ICD-10-CM

## 2021-01-12 DIAGNOSIS — Z5111 Encounter for antineoplastic chemotherapy: Secondary | ICD-10-CM | POA: Diagnosis not present

## 2021-01-12 DIAGNOSIS — E039 Hypothyroidism, unspecified: Secondary | ICD-10-CM | POA: Diagnosis not present

## 2021-01-12 LAB — CMP (CANCER CENTER ONLY)
ALT: 38 U/L (ref 0–44)
AST: 26 U/L (ref 15–41)
Albumin: 3.6 g/dL (ref 3.5–5.0)
Alkaline Phosphatase: 71 U/L (ref 38–126)
Anion gap: 11 (ref 5–15)
BUN: 8 mg/dL (ref 6–20)
CO2: 22 mmol/L (ref 22–32)
Calcium: 9.5 mg/dL (ref 8.9–10.3)
Chloride: 108 mmol/L (ref 98–111)
Creatinine: 0.79 mg/dL (ref 0.61–1.24)
GFR, Estimated: >60 mL/min (ref >60)
Glucose, Bld: 112 mg/dL — ABNORMAL HIGH (ref 70–99)
Potassium: 4.4 mmol/L (ref 3.5–5.1)
Sodium: 141 mmol/L (ref 135–145)
Total Bilirubin: 0.7 mg/dL (ref 0.3–1.2)
Total Protein: 7.9 g/dL (ref 6.5–8.1)

## 2021-01-12 LAB — CBC WITH DIFFERENTIAL (CANCER CENTER ONLY)
Abs Immature Granulocytes: 0.03 K/uL (ref 0.00–0.07)
Basophils Absolute: 0 K/uL (ref 0.0–0.1)
Basophils Relative: 0 %
Eosinophils Absolute: 0.1 K/uL (ref 0.0–0.5)
Eosinophils Relative: 3 %
HCT: 40.1 % (ref 39.0–52.0)
Hemoglobin: 14.2 g/dL (ref 13.0–17.0)
Immature Granulocytes: 1 %
Lymphocytes Relative: 22 %
Lymphs Abs: 1.1 K/uL (ref 0.7–4.0)
MCH: 31.3 pg (ref 26.0–34.0)
MCHC: 35.4 g/dL (ref 30.0–36.0)
MCV: 88.3 fL (ref 80.0–100.0)
Monocytes Absolute: 0.7 K/uL (ref 0.1–1.0)
Monocytes Relative: 14 %
Neutro Abs: 3 K/uL (ref 1.7–7.7)
Neutrophils Relative %: 60 %
Platelet Count: 197 K/uL (ref 150–400)
RBC: 4.54 MIL/uL (ref 4.22–5.81)
RDW: 12.3 % (ref 11.5–15.5)
WBC Count: 5 K/uL (ref 4.0–10.5)
nRBC: 0 % (ref 0.0–0.2)

## 2021-01-12 LAB — TSH: TSH: <0.08 u[IU]/mL — ABNORMAL LOW (ref 0.320–4.118)

## 2021-01-12 MED ORDER — SODIUM CHLORIDE 0.9 % IV SOLN
1500.0000 mg | Freq: Once | INTRAVENOUS | Status: AC
  Administered 2021-01-12: 1500 mg via INTRAVENOUS
  Filled 2021-01-12: qty 30

## 2021-01-12 MED ORDER — SODIUM CHLORIDE 0.9 % IV SOLN: Freq: Once | INTRAVENOUS | Status: AC

## 2021-01-12 MED ORDER — ONDANSETRON HCL 8 MG PO TABS: 8.0000 mg | ORAL_TABLET | Freq: Three times a day (TID) | ORAL | 1 refills | Status: DC | PRN

## 2021-01-12 MED ORDER — PROCHLORPERAZINE MALEATE 10 MG PO TABS: 10.0000 mg | ORAL_TABLET | Freq: Four times a day (QID) | ORAL | 2 refills | Status: DC | PRN

## 2021-01-12 NOTE — Patient Instructions (Signed)
[facility]  Discharge Instructions: Thank you for choosing [facility] to provide your oncology and hematology care.   If you have a lab appointment with the [facility], please go directly to the [facility] and check in at the registration area.   Wear comfortable clothing and clothing appropriate for easy access to any Portacath or PICC line.   We strive to give you quality time with your provider. You may need to reschedule your appointment if you arrive late (15 or more minutes).  Arriving late affects you and other patients whose appointments are after yours.  Also, if you miss three or more appointments without notifying the office, you may be dismissed from the clinic at the provider's discretion.      For prescription refill requests, have your pharmacy contact our office and allow 72 hours for refills to be completed.    Today you received the following chemotherapy and/or immunotherapy agents iminfzi      To help prevent nausea and vomiting after your treatment, we encourage you to take your nausea medication as directed.  BELOW ARE SYMPTOMS THAT SHOULD BE REPORTED IMMEDIATELY: *FEVER GREATER THAN 100.4 F (38 C) OR HIGHER *CHILLS OR SWEATING *NAUSEA AND VOMITING THAT IS NOT CONTROLLED WITH YOUR NAUSEA MEDICATION *UNUSUAL SHORTNESS OF BREATH *UNUSUAL BRUISING OR BLEEDING *URINARY PROBLEMS (pain or burning when urinating, or frequent urination) *BOWEL PROBLEMS (unusual diarrhea, constipation, pain near the anus) TENDERNESS IN MOUTH AND THROAT WITH OR WITHOUT PRESENCE OF ULCERS (sore throat, sores in mouth, or a toothache) UNUSUAL RASH, SWELLING OR PAIN  UNUSUAL VAGINAL DISCHARGE OR ITCHING   Items with * indicate a potential emergency and should be followed up as soon as possible or go to the Emergency Department if any problems should occur.  Please show the CHEMOTHERAPY ALERT CARD or IMMUNOTHERAPY ALERT CARD at check-in to the  Emergency Department and triage nurse.  Should you have questions after your visit or need to cancel or reschedule your appointment, please contact [facility]  Dept: [phone]  and follow the prompts.  Office hours are 8:00 a.m. to 4:30 p.m. Monday - Friday. Please note that voicemails left after 4:00 p.m. may not be returned until the following business day.  We are closed weekends and major holidays. You have access to a nurse at all times for urgent questions. Please call the main number to the clinic Dept: [phone] and follow the prompts.   For any non-urgent questions, you may also contact your provider using MyChart. We now offer e-Visits for anyone 74 and older to request care online for non-urgent symptoms. For details visit [email or web address].   Also download the MyChart app! Go to the app store, search "MyChart", open the app, select [facility], and log in with your MyChart username and password.  Due to Covid, a mask is required upon entering the hospital/clinic. If you do not have a mask, one will be given to you upon arrival. For doctor visits, patients may have 1 support person aged 69 or older with them. For treatment visits, patients cannot have anyone with them due to current Covid guidelines and our immunocompromised population.   Durvalumab injection What is this medication? DURVALUMAB (dur VAL ue mab) is a monoclonal antibody. It is used to treat lungcancer. This medicine may be used for other purposes; ask your health care provider orpharmacist if you have questions. COMMON BRAND NAME(S): IMFINZI What should I tell my care  team before I take this medication? They need to know if you have any of these conditions: autoimmune diseases like Crohn's disease, ulcerative colitis, or lupus have had or planning to have an allogeneic stem cell transplant (uses someone else's stem cells) history of organ transplant history of radiation to the  chest nervous system problems like myasthenia gravis or Guillain-Barre syndrome an unusual or allergic reaction to durvalumab, other medicines, foods, dyes, or preservatives pregnant or trying to get pregnant breast-feeding How should I use this medication? This medicine is for infusion into a vein. It is given by a health careprofessional in a hospital or clinic setting. A special MedGuide will be given to you before each treatment. Be sure to readthis information carefully each time. Talk to your pediatrician regarding the use of this medicine in children.Special care may be needed. Overdosage: If you think you have taken too much of this medicine contact apoison control center or emergency room at once. NOTE: This medicine is only for you. Do not share this medicine with others. What if I miss a dose? It is important not to miss your dose. Call your doctor or health careprofessional if you are unable to keep an appointment. What may interact with this medication? Interactions have not been studied. This list may not describe all possible interactions. Give your health care provider a list of all the medicines, herbs, non-prescription drugs, or dietary supplements you use. Also tell them if you smoke, drink alcohol, or use illegaldrugs. Some items may interact with your medicine. What should I watch for while using this medication? This drug may make you feel generally unwell. Continue your course of treatmenteven though you feel ill unless your doctor tells you to stop. You may need blood work done while you are taking this medicine. Do not become pregnant while taking this medicine or for 3 months after stopping it. Women should inform their doctor if they wish to become pregnant or think they might be pregnant. There is a potential for serious side effects to an unborn child. Talk to your health care professional or pharmacist for more information. Do not breast-feed an infant while taking  this medicine orfor 3 months after stopping it. What side effects may I notice from receiving this medication? Side effects that you should report to your doctor or health care professionalas soon as possible: allergic reactions like skin rash, itching or hives, swelling of the face, lips, or tongue black, tarry stools bloody or watery diarrhea breathing problems change in emotions or moods change in sex drive changes in vision chest pain or chest tightness chills confusion cough facial flushing fever headache signs and symptoms of high blood sugar such as dizziness; dry mouth; dry skin; fruity breath; nausea; stomach pain; increased hunger or thirst; increased urination signs and symptoms of liver injury like dark yellow or brown urine; general ill feeling or flu-like symptoms; light-colored stools; loss of appetite; nausea; right upper belly pain; unusually weak or tired; yellowing of the eyes or skin stomach pain trouble passing urine or change in the amount of urine weight gain or weight loss Side effects that usually do not require medical attention (report these toyour doctor or health care professional if they continue or are bothersome): bone pain constipation loss of appetite muscle pain nausea swelling of the ankles, feet, hands tiredness This list may not describe all possible side effects. Call your doctor for medical advice about side effects. You may report side effects to FDA at1-800-FDA-1088. Where should  I keep my medication? This drug is given in a hospital or clinic and will not be stored at home. NOTE: This sheet is a summary. It may not cover all possible information. If you have questions about this medicine, talk to your doctor, pharmacist, orhealth care provider.  2022 Elsevier/Gold Standard (2019-07-04 13:01:29)

## 2021-01-13 ENCOUNTER — Telehealth: Payer: Self-pay | Admitting: Internal Medicine

## 2021-01-13 NOTE — Telephone Encounter (Signed)
Scheduled appt per 9/6 los- patient to get an updated schedule next visit on schedule.

## 2021-01-25 ENCOUNTER — Ambulatory Visit (HOSPITAL_COMMUNITY)
Admission: RE | Admit: 2021-01-25 | Discharge: 2021-01-25 | Disposition: A | Discharge status: Home / Self Care | Payer: Managed Care, Other (non HMO) | Source: Ambulatory Visit | Attending: Physician Assistant | Admitting: Physician Assistant

## 2021-01-25 ENCOUNTER — Other Ambulatory Visit: Payer: Self-pay | Admitting: Physician Assistant

## 2021-01-25 ENCOUNTER — Other Ambulatory Visit: Payer: Self-pay

## 2021-01-25 DIAGNOSIS — C3491 Malignant neoplasm of unspecified part of right bronchus or lung: Secondary | ICD-10-CM

## 2021-01-25 DIAGNOSIS — R296 Repeated falls: Secondary | ICD-10-CM | POA: Diagnosis present

## 2021-01-25 MED ORDER — GADOBUTROL 1 MMOL/ML IV SOLN
9.0000 mL | Freq: Once | INTRAVENOUS | Status: AC | PRN
  Administered 2021-01-25: 9 mL via INTRAVENOUS

## 2021-01-26 ENCOUNTER — Telehealth: Payer: Self-pay | Admitting: Physician Assistant

## 2021-01-26 NOTE — Telephone Encounter (Signed)
I called the patient to let him know that his brain MRI did not show any evidence for metastatic disease to the brain. He expressed understanding.

## 2021-02-08 ENCOUNTER — Other Ambulatory Visit: Payer: Self-pay

## 2021-02-08 ENCOUNTER — Encounter: Payer: Self-pay | Admitting: Internal Medicine

## 2021-02-08 ENCOUNTER — Inpatient Hospital Stay: Payer: Managed Care, Other (non HMO) | Attending: Internal Medicine | Admitting: Internal Medicine

## 2021-02-08 ENCOUNTER — Inpatient Hospital Stay: Payer: Managed Care, Other (non HMO)

## 2021-02-08 VITALS — BP 120/87 | HR 100 | Temp 97.4°F | Resp 19 | Ht 68.0 in | Wt 202.2 lb

## 2021-02-08 DIAGNOSIS — C3490 Malignant neoplasm of unspecified part of unspecified bronchus or lung: Secondary | ICD-10-CM

## 2021-02-08 DIAGNOSIS — R5383 Other fatigue: Secondary | ICD-10-CM | POA: Insufficient documentation

## 2021-02-08 DIAGNOSIS — C3491 Malignant neoplasm of unspecified part of right bronchus or lung: Secondary | ICD-10-CM

## 2021-02-08 DIAGNOSIS — I824Z2 Acute embolism and thrombosis of unspecified deep veins of left distal lower extremity: Secondary | ICD-10-CM | POA: Insufficient documentation

## 2021-02-08 DIAGNOSIS — G4700 Insomnia, unspecified: Secondary | ICD-10-CM | POA: Insufficient documentation

## 2021-02-08 DIAGNOSIS — Z7901 Long term (current) use of anticoagulants: Secondary | ICD-10-CM | POA: Insufficient documentation

## 2021-02-08 DIAGNOSIS — R059 Cough, unspecified: Secondary | ICD-10-CM | POA: Insufficient documentation

## 2021-02-08 DIAGNOSIS — Z79899 Other long term (current) drug therapy: Secondary | ICD-10-CM | POA: Insufficient documentation

## 2021-02-08 DIAGNOSIS — E039 Hypothyroidism, unspecified: Secondary | ICD-10-CM | POA: Insufficient documentation

## 2021-02-08 DIAGNOSIS — Z5112 Encounter for antineoplastic immunotherapy: Secondary | ICD-10-CM

## 2021-02-08 DIAGNOSIS — J189 Pneumonia, unspecified organism: Secondary | ICD-10-CM | POA: Insufficient documentation

## 2021-02-08 DIAGNOSIS — Z9981 Dependence on supplemental oxygen: Secondary | ICD-10-CM | POA: Insufficient documentation

## 2021-02-08 DIAGNOSIS — Z87891 Personal history of nicotine dependence: Secondary | ICD-10-CM | POA: Insufficient documentation

## 2021-02-08 LAB — CMP (CANCER CENTER ONLY)
ALT: 25 U/L (ref 0–44)
AST: 21 U/L (ref 15–41)
Albumin: 3 g/dL — ABNORMAL LOW (ref 3.5–5.0)
Alkaline Phosphatase: 72 U/L (ref 38–126)
Anion gap: 10 (ref 5–15)
BUN: 11 mg/dL (ref 6–20)
CO2: 23 mmol/L (ref 22–32)
Calcium: 9.3 mg/dL (ref 8.9–10.3)
Chloride: 102 mmol/L (ref 98–111)
Creatinine: 0.87 mg/dL (ref 0.61–1.24)
GFR, Estimated: >60 mL/min (ref >60)
Glucose, Bld: 171 mg/dL — ABNORMAL HIGH (ref 70–99)
Potassium: 4 mmol/L (ref 3.5–5.1)
Sodium: 135 mmol/L (ref 135–145)
Total Bilirubin: 0.5 mg/dL (ref 0.3–1.2)
Total Protein: 7.8 g/dL (ref 6.5–8.1)

## 2021-02-08 LAB — CBC WITH DIFFERENTIAL (CANCER CENTER ONLY)
Abs Immature Granulocytes: 0.02 K/uL (ref 0.00–0.07)
Basophils Absolute: 0 K/uL (ref 0.0–0.1)
Basophils Relative: 0 %
Eosinophils Absolute: 0.2 K/uL (ref 0.0–0.5)
Eosinophils Relative: 3 %
HCT: 42.9 % (ref 39.0–52.0)
Hemoglobin: 14.7 g/dL (ref 13.0–17.0)
Immature Granulocytes: 0 %
Lymphocytes Relative: 15 %
Lymphs Abs: 1.1 K/uL (ref 0.7–4.0)
MCH: 29.6 pg (ref 26.0–34.0)
MCHC: 34.3 g/dL (ref 30.0–36.0)
MCV: 86.3 fL (ref 80.0–100.0)
Monocytes Absolute: 1 K/uL (ref 0.1–1.0)
Monocytes Relative: 14 %
Neutro Abs: 4.9 K/uL (ref 1.7–7.7)
Neutrophils Relative %: 68 %
Platelet Count: 312 K/uL (ref 150–400)
RBC: 4.97 MIL/uL (ref 4.22–5.81)
RDW: 11.4 % — ABNORMAL LOW (ref 11.5–15.5)
WBC Count: 7.1 K/uL (ref 4.0–10.5)
nRBC: 0 % (ref 0.0–0.2)

## 2021-02-08 LAB — TSH: TSH: 0.181 u[IU]/mL — ABNORMAL LOW (ref 0.320–4.118)

## 2021-02-08 MED ORDER — HYDROCODONE BIT-HOMATROP MBR 5-1.5 MG/5ML PO SOLN: 5.0000 mL | Freq: Four times a day (QID) | ORAL | 0 refills | Status: DC | PRN

## 2021-02-08 MED ORDER — DURVALUMAB 500 MG/10ML IV SOLN
1500.0000 mg | Freq: Once | INTRAVENOUS | Status: AC
  Administered 2021-02-08: 1500 mg via INTRAVENOUS
  Filled 2021-02-08: qty 30

## 2021-02-08 MED ORDER — SODIUM CHLORIDE 0.9 % IV SOLN
Freq: Once | INTRAVENOUS | Status: AC
Start: 2021-02-08 — End: 2021-02-08

## 2021-02-08 NOTE — Patient Instructions (Signed)
[facility]  Discharge Instructions: Thank you for choosing [facility] to provide your oncology and hematology care.   If you have a lab appointment with the Cancer Center, please go directly to the Cancer Center and check in at the registration area.   Wear comfortable clothing and clothing appropriate for easy access to any Portacath or PICC line.   We strive to give you quality time with your provider. You may need to reschedule your appointment if you arrive late (15 or more minutes).  Arriving late affects you and other patients whose appointments are after yours.  Also, if you miss three or more appointments without notifying the office, you may be dismissed from the clinic at the provider's discretion.      For prescription refill requests, have your pharmacy contact our office and allow 72 hours for refills to be completed.    Today you received the following chemotherapy and/or immunotherapy agents Imfinzi      To help prevent nausea and vomiting after your treatment, we encourage you to take your nausea medication as directed.  BELOW ARE SYMPTOMS THAT SHOULD BE REPORTED IMMEDIATELY: *FEVER GREATER THAN 100.4 F (38 C) OR HIGHER *CHILLS OR SWEATING *NAUSEA AND VOMITING THAT IS NOT CONTROLLED WITH YOUR NAUSEA MEDICATION *UNUSUAL SHORTNESS OF BREATH *UNUSUAL BRUISING OR BLEEDING *URINARY PROBLEMS (pain or burning when urinating, or frequent urination) *BOWEL PROBLEMS (unusual diarrhea, constipation, pain near the anus) TENDERNESS IN MOUTH AND THROAT WITH OR WITHOUT PRESENCE OF ULCERS (sore throat, sores in mouth, or a toothache) UNUSUAL RASH, SWELLING OR PAIN  UNUSUAL VAGINAL DISCHARGE OR ITCHING   Items with * indicate a potential emergency and should be followed up as soon as possible or go to the Emergency Department if any problems should occur.  Please show the CHEMOTHERAPY ALERT CARD or IMMUNOTHERAPY ALERT CARD at check-in to the  Emergency Department and triage nurse.  Should you have questions after your visit or need to cancel or reschedule your appointment, please contact [facility]  Dept: [phone]  and follow the prompts.  Office hours are 8:00 a.m. to 4:30 p.m. Monday - Friday. Please note that voicemails left after 4:00 p.m. may not be returned until the following business day.  We are closed weekends and major holidays. You have access to a nurse at all times for urgent questions. Please call the main number to the clinic Dept: [phone] and follow the prompts.   For any non-urgent questions, you may also contact your provider using MyChart. We now offer e-Visits for anyone 18 and older to request care online for non-urgent symptoms. For details visit [email or web address].   Also download the MyChart app! Go to the app store, search "MyChart", open the app, select [facility], and log in with your MyChart username and password.  Due to Covid, a mask is required upon entering the hospital/clinic. If you do not have a mask, one will be given to you upon arrival. For doctor visits, patients may have 1 support person aged 18 or older with them. For treatment visits, patients cannot have anyone with them due to current Covid guidelines and our immunocompromised population.   

## 2021-02-08 NOTE — Progress Notes (Signed)
[facility] Telephone:([phone]) [phone]   Fax:([phone]  OFFICE PROGRESS NOTE  Paul Noble, MD [address] [place] [address]  DIAGNOSIS: Stage IIIb (T3, N2, M0) non-small cell lung cancer, squamous cell carcinoma presented with large right upper lobe perihilar mass with right hilar and subcarinal lymphadenopathy diagnosed in April 2022.  PRIOR THERAPY: Concurrent chemoradiation with weekly carboplatin for AUC of 2 and paclitaxel 45 Mg/M2.  Status post 8 cycles.  Last dose was given November 10, 2020  CURRENT THERAPY: Consolidation treatment with immunotherapy with Imfinzi 1500 Mg IV every 4 weeks.  First dose December 14, 2020.  Status post 2 cycles  INTERVAL HISTORY: Paul Mclean 60 y.o. male returns to the clinic today for follow-up visit accompanied by his wife.  The patient is feeling fine today with no concerning complaints except for the persistent fatigue and shortness of breath with exertion.  He also has dry cough that is worse at nighttime.  He denied having any fever or chills.  He has no nausea, vomiting, diarrhea or constipation.  He has no weight loss or night sweats.  He has no headache or visual changes.  He is here today for evaluation before starting cycle #3 of his treatment.  MEDICAL HISTORY: Past Medical History:  Diagnosis Date   History of radiation therapy 11/12/2020   right lung  09/30/2020-11/12/2020  Dr Paul Mclean   Hypothyroidism    Thyroid disease     ALLERGIES:  has No Known Allergies.  MEDICATIONS:  Current Outpatient Medications  Medication Sig Dispense Refill   HYDROcodone-acetaminophen (HYCET) 7.5-325 mg/15 ml solution Take 10 mLs by mouth 4 (four) times daily as needed for moderate pain. (Patient not taking: Reported on 12/24/2020) 120 mL 0   levothyroxine (SYNTHROID) 112 MCG tablet Take 112 mcg by mouth in the morning.     lidocaine (XYLOCAINE) 2 % solution Use as directed 10 mLs in the mouth or throat as needed for mouth  pain. Mix 1 part 2% viscous lidocaine, and 1 part water. Swish and swallow 10 mL of diluted mixture, 30 minutes before meals and at bedtime (up to QID).   Empty contents of lidocaine bottle into a bigger container. Fill lidocaine bottle with water, and then pour water into larger container that now has lidocaine in it. Mix well and then measure your 55mL from this diluted mixture (Patient not taking: Reported on 12/24/2020) 100 mL 1   LORazepam (ATIVAN) 0.5 MG tablet Take 1 tablet (0.5 mg total) by mouth at bedtime. (Patient not taking: Reported on 12/24/2020) 20 tablet 0   ondansetron (ZOFRAN) 8 MG tablet Take 1 tablet (8 mg total) by mouth every 8 (eight) hours as needed for nausea or vomiting. 30 tablet 1   prochlorperazine (COMPAZINE) 10 MG tablet TAKE 1 TABLET(10 MG) BY MOUTH EVERY 6 HOURS AS NEEDED FOR NAUSEA OR VOMITING 30 tablet 2   sucralfate (CARAFATE) 1 g tablet Take 1 tablet (1 g total) by mouth 4 (four) times daily -  with meals and at bedtime. Crush and dissolve in 10 mL of warm water prior to swallowing (Patient not taking: Reported on 12/24/2020) 120 tablet 1   umeclidinium-vilanterol (ANORO ELLIPTA) 62.5-25 MCG/INH AEPB Inhale 1 puff into the lungs daily. (Patient not taking: Reported on 12/24/2020) 60 each 6   zolpidem (AMBIEN) 5 MG tablet Take 1 tablet (5 mg total) by mouth at bedtime as needed for sleep. 30 tablet 0   No current facility-administered  medications for this visit.    SURGICAL HISTORY:  Past Surgical History:  Procedure Laterality Date   APPENDECTOMY     BRONCHIAL BIOPSY  09/01/2020   Procedure: BRONCHIAL BIOPSIES;  Surgeon: Garner Nash, DO;  Location: [facility];  Service: Pulmonary;;   BRONCHIAL BRUSHINGS  09/01/2020   Procedure: BRONCHIAL BRUSHINGS;  Surgeon: Garner Nash, DO;  Location: [facility];  Service: Pulmonary;;   BRONCHIAL NEEDLE ASPIRATION BIOPSY  09/01/2020   Procedure: BRONCHIAL NEEDLE ASPIRATION BIOPSIES;  Surgeon: Garner Nash, DO;   Location: [facility];  Service: Pulmonary;;   BRONCHIAL WASHINGS  09/01/2020   Procedure: BRONCHIAL WASHINGS;  Surgeon: Garner Nash, DO;  Location: [facility];  Service: Pulmonary;;   HERNIA REPAIR     INCISION AND DRAINAGE ABSCESS N/A 08/25/2017   Procedure: INCISION AND DRAINAGE perineal  ABSCESS;  Surgeon: Festus Aloe, MD;  Location: [facility];  Service: Urology;  Laterality: N/A;   VIDEO BRONCHOSCOPY WITH ENDOBRONCHIAL ULTRASOUND N/A 09/01/2020   Procedure: VIDEO BRONCHOSCOPY WITH ENDOBRONCHIAL ULTRASOUND;  Surgeon: Garner Nash, DO;  Location: [facility];  Service: Pulmonary;  Laterality: N/A;    REVIEW OF SYSTEMS:  A comprehensive review of systems was negative except for: Constitutional: positive for fatigue Respiratory: positive for cough and dyspnea on exertion   PHYSICAL EXAMINATION: General appearance: alert, cooperative, fatigued, and no distress Head: Normocephalic, without obvious abnormality, atraumatic Neck: no adenopathy, no JVD, supple, symmetrical, trachea midline, and thyroid not enlarged, symmetric, no tenderness/mass/nodules Lymph nodes: Cervical, supraclavicular, and axillary nodes normal. Resp: clear to auscultation bilaterally Back: symmetric, no curvature. ROM normal. No CVA tenderness. Cardio: regular rate and rhythm, S1, S2 normal, no murmur, click, rub or gallop GI: soft, non-tender; bowel sounds normal; no masses,  no organomegaly Extremities: extremities normal, atraumatic, no cyanosis or edema  ECOG PERFORMANCE STATUS: 1 - Symptomatic but completely ambulatory  Blood pressure 120/87, pulse 100, temperature (!) 97.4 F (36.3 C), temperature source Tympanic, resp. rate 19, height 5\' 8"  (1.727 m), weight 202 lb 3.2 oz (91.7 kg), SpO2 100 %.  LABORATORY DATA: Lab Results  Component Value Date   WBC 7.1 02/08/2021   HGB 14.7 02/08/2021   HCT 42.9 02/08/2021   MCV 86.3 02/08/2021   PLT 312 02/08/2021      Chemistry      Component Value  Date/Time   NA 141 01/12/2021 0839   K 4.4 01/12/2021 0839   CL 108 01/12/2021 0839   CO2 22 01/12/2021 0839   BUN 8 01/12/2021 0839   CREATININE 0.79 01/12/2021 0839      Component Value Date/Time   CALCIUM 9.5 01/12/2021 0839   ALKPHOS 71 01/12/2021 0839   AST 26 01/12/2021 0839   ALT 38 01/12/2021 0839   BILITOT 0.7 01/12/2021 0839       RADIOGRAPHIC STUDIES: MR Brain W Wo Contrast  Result Date: 01/26/2021 CLINICAL DATA:  Metastatic disease evaluation, lung cancer EXAM: MRI HEAD WITHOUT AND WITH CONTRAST TECHNIQUE: Multiplanar, multiecho pulse sequences of the brain and surrounding structures were obtained without and with intravenous contrast. CONTRAST:  45mL GADAVIST GADOBUTROL 1 MMOL/ML IV SOLN COMPARISON:  09/16/2020 FINDINGS: Brain: No acute infarction, intracranial hemorrhage, mass, mass effect, or midline shift. No hydrocephalus or extra-axial fluid collection. Ventricles and sulci are within normal limits for age. No abnormal enhancement. Vascular: Normal flow voids. Skull and upper cervical spine: Normal marrow signal. Sinuses/Orbits: Mucosal thickening in the frontal sinuses. The orbits are unremarkable. Other: Redemonstrated susceptibility artifact along the left  frontal scalp, of indeterminate etiology. The mastoids are well aerated. IMPRESSION: No evidence of intracranial metastatic disease. Electronically Signed   By: Merilyn Baba M.D.   On: 01/26/2021 03:42     ASSESSMENT AND PLAN: This is a very pleasant 60 years old white male recently diagnosed with stage IIIb (T3, N2, M0) non-small cell lung cancer, squamous cell carcinoma presented with large right upper lobe perihilar mass with right hilar and subcarinal lymphadenopathy diagnosed in April 2022. The patient underwent a course of concurrent chemoradiation with weekly carboplatin and paclitaxel status post 8 cycles.  The patient tolerated his treatment well except for fatigue. His scan showed partial response with  improvement in the right hilar mass as well as resolution of the right upper lobe lung nodule. He is currently undergoing consolidation treatment with immunotherapy with Imfinzi 1500 Mg IV every 4 weeks status post 2 cycles. The patient continues to tolerate this treatment well with no concerning adverse effects except for fatigue. I recommended for him to proceed with cycle #3 today as planned. I will see him back for follow-up visit in 4 weeks for evaluation with repeat CT scan of the chest for restaging of his disease. For the dry cough, I will give the patient prescription for Hycodan 5 mL p.o. every 6 hours as needed. The patient was advised to call immediately if he has any other concerning symptoms in the interval. The patient voices understanding of current disease status and treatment options and is in agreement with the current care plan.  All questions were answered. The patient knows to call the clinic with any problems, questions or concerns. We can certainly see the patient much sooner if necessary.   Disclaimer: This note was dictated with voice recognition software. Similar sounding words can inadvertently be transcribed and may not be corrected upon review.

## 2021-02-10 ENCOUNTER — Inpatient Hospital Stay (HOSPITAL_COMMUNITY)
Admission: EM | Admit: 2021-02-10 | Discharge: 2021-02-12 | DRG: 193 | Disposition: A | Discharge status: Home / Self Care | Payer: Managed Care, Other (non HMO) | Attending: Internal Medicine | Admitting: Internal Medicine

## 2021-02-10 ENCOUNTER — Emergency Department (HOSPITAL_COMMUNITY): Payer: Managed Care, Other (non HMO)

## 2021-02-10 ENCOUNTER — Other Ambulatory Visit: Payer: Self-pay

## 2021-02-10 ENCOUNTER — Encounter (HOSPITAL_COMMUNITY): Payer: Self-pay | Admitting: Emergency Medicine

## 2021-02-10 DIAGNOSIS — C3491 Malignant neoplasm of unspecified part of right bronchus or lung: Secondary | ICD-10-CM | POA: Diagnosis present

## 2021-02-10 DIAGNOSIS — E46 Unspecified protein-calorie malnutrition: Secondary | ICD-10-CM

## 2021-02-10 DIAGNOSIS — E440 Moderate protein-calorie malnutrition: Secondary | ICD-10-CM | POA: Diagnosis present

## 2021-02-10 DIAGNOSIS — J189 Pneumonia, unspecified organism: Secondary | ICD-10-CM | POA: Diagnosis present

## 2021-02-10 DIAGNOSIS — E039 Hypothyroidism, unspecified: Secondary | ICD-10-CM | POA: Diagnosis present

## 2021-02-10 DIAGNOSIS — Z20822 Contact with and (suspected) exposure to covid-19: Secondary | ICD-10-CM | POA: Diagnosis present

## 2021-02-10 DIAGNOSIS — Z87891 Personal history of nicotine dependence: Secondary | ICD-10-CM | POA: Diagnosis not present

## 2021-02-10 DIAGNOSIS — R0602 Shortness of breath: Secondary | ICD-10-CM

## 2021-02-10 DIAGNOSIS — R0902 Hypoxemia: Secondary | ICD-10-CM

## 2021-02-10 DIAGNOSIS — Z809 Family history of malignant neoplasm, unspecified: Secondary | ICD-10-CM | POA: Diagnosis not present

## 2021-02-10 DIAGNOSIS — J9601 Acute respiratory failure with hypoxia: Secondary | ICD-10-CM | POA: Diagnosis present

## 2021-02-10 DIAGNOSIS — E871 Hypo-osmolality and hyponatremia: Secondary | ICD-10-CM | POA: Diagnosis present

## 2021-02-10 DIAGNOSIS — Z79890 Hormone replacement therapy (postmenopausal): Secondary | ICD-10-CM | POA: Diagnosis not present

## 2021-02-10 DIAGNOSIS — E8809 Other disorders of plasma-protein metabolism, not elsewhere classified: Secondary | ICD-10-CM | POA: Diagnosis present

## 2021-02-10 DIAGNOSIS — Z6830 Body mass index (BMI) 30.0-30.9, adult: Secondary | ICD-10-CM

## 2021-02-10 DIAGNOSIS — Z79899 Other long term (current) drug therapy: Secondary | ICD-10-CM | POA: Diagnosis not present

## 2021-02-10 DIAGNOSIS — C3490 Malignant neoplasm of unspecified part of unspecified bronchus or lung: Secondary | ICD-10-CM

## 2021-02-10 LAB — COMPREHENSIVE METABOLIC PANEL WITH GFR
ALT: 21 U/L (ref 0–44)
AST: 20 U/L (ref 15–41)
Albumin: 3 g/dL — ABNORMAL LOW (ref 3.5–5.0)
Alkaline Phosphatase: 58 U/L (ref 38–126)
Anion gap: 7 (ref 5–15)
BUN: 13 mg/dL (ref 6–20)
CO2: 26 mmol/L (ref 22–32)
Calcium: 8.7 mg/dL — ABNORMAL LOW (ref 8.9–10.3)
Chloride: 100 mmol/L (ref 98–111)
Creatinine, Ser: 0.82 mg/dL (ref 0.61–1.24)
GFR, Estimated: >60 mL/min (ref >60)
Glucose, Bld: 129 mg/dL — ABNORMAL HIGH (ref 70–99)
Potassium: 3.7 mmol/L (ref 3.5–5.1)
Sodium: 133 mmol/L — ABNORMAL LOW (ref 135–145)
Total Bilirubin: 0.3 mg/dL (ref 0.3–1.2)
Total Protein: 7.6 g/dL (ref 6.5–8.1)

## 2021-02-10 LAB — CBC WITH DIFFERENTIAL/PLATELET
Abs Immature Granulocytes: 0.02 K/uL (ref 0.00–0.07)
Basophils Absolute: 0 K/uL (ref 0.0–0.1)
Basophils Relative: 0 %
Eosinophils Absolute: 0.1 K/uL (ref 0.0–0.5)
Eosinophils Relative: 2 %
HCT: 41.2 % (ref 39.0–52.0)
Hemoglobin: 14.1 g/dL (ref 13.0–17.0)
Immature Granulocytes: 0 %
Lymphocytes Relative: 16 %
Lymphs Abs: 0.9 K/uL (ref 0.7–4.0)
MCH: 30.5 pg (ref 26.0–34.0)
MCHC: 34.2 g/dL (ref 30.0–36.0)
MCV: 89.2 fL (ref 80.0–100.0)
Monocytes Absolute: 0.9 K/uL (ref 0.1–1.0)
Monocytes Relative: 16 %
Neutro Abs: 3.6 K/uL (ref 1.7–7.7)
Neutrophils Relative %: 66 %
Platelets: 337 K/uL (ref 150–400)
RBC: 4.62 MIL/uL (ref 4.22–5.81)
RDW: 11.6 % (ref 11.5–15.5)
WBC: 5.6 K/uL (ref 4.0–10.5)
nRBC: 0 % (ref 0.0–0.2)

## 2021-02-10 LAB — RESP PANEL BY RT-PCR (FLU A&B, COVID) ARPGX2
Influenza A by PCR: NEGATIVE
Influenza B by PCR: NEGATIVE
SARS Coronavirus 2 by RT PCR: NEGATIVE

## 2021-02-10 MED ORDER — SODIUM CHLORIDE 0.9 % IV SOLN: Freq: Once | INTRAVENOUS | Status: AC

## 2021-02-10 MED ORDER — ENOXAPARIN SODIUM 40 MG/0.4ML IJ SOSY
40.0000 mg | PREFILLED_SYRINGE | INTRAMUSCULAR | Status: DC
  Administered 2021-02-11 – 2021-02-12 (×2): 40 mg via SUBCUTANEOUS
  Filled 2021-02-10 (×2): qty 0.4

## 2021-02-10 MED ORDER — AZITHROMYCIN 500 MG IV SOLR
500.0000 mg | INTRAVENOUS | Status: DC
  Administered 2021-02-11 – 2021-02-12 (×2): 500 mg via INTRAVENOUS
  Filled 2021-02-10 (×2): qty 500

## 2021-02-10 MED ORDER — ACETAMINOPHEN 325 MG PO TABS
650.0000 mg | ORAL_TABLET | Freq: Four times a day (QID) | ORAL | Status: DC | PRN
  Administered 2021-02-11: 650 mg via ORAL
  Filled 2021-02-10: qty 2

## 2021-02-10 MED ORDER — SODIUM CHLORIDE 0.9 % IV SOLN
1.0000 g | INTRAVENOUS | Status: DC
  Administered 2021-02-11 – 2021-02-12 (×2): 1 g via INTRAVENOUS
  Filled 2021-02-10 (×2): qty 10

## 2021-02-10 MED ORDER — ENSURE ENLIVE PO LIQD
237.0000 mL | Freq: Two times a day (BID) | ORAL | Status: DC
  Administered 2021-02-11: 237 mL via ORAL
  Filled 2021-02-10 (×4): qty 237

## 2021-02-10 MED ORDER — IOHEXOL 350 MG/ML SOLN
100.0000 mL | Freq: Once | INTRAVENOUS | Status: AC | PRN
  Administered 2021-02-10: 80 mL via INTRAVENOUS

## 2021-02-10 MED ORDER — SODIUM CHLORIDE 0.9 % IV SOLN
500.0000 mg | Freq: Once | INTRAVENOUS | Status: AC
  Administered 2021-02-10: 500 mg via INTRAVENOUS
  Filled 2021-02-10: qty 500

## 2021-02-10 MED ORDER — ACETAMINOPHEN 650 MG RE SUPP: 650.0000 mg | Freq: Four times a day (QID) | RECTAL | Status: DC | PRN

## 2021-02-10 MED ORDER — DM-GUAIFENESIN ER 30-600 MG PO TB12
1.0000 | ORAL_TABLET | Freq: Two times a day (BID) | ORAL | Status: DC
  Administered 2021-02-11 – 2021-02-12 (×3): 1 via ORAL
  Filled 2021-02-10: qty 1
  Filled 2021-02-10: qty 2
  Filled 2021-02-10: qty 1

## 2021-02-10 MED ORDER — SODIUM CHLORIDE 0.9 % IV SOLN
1.0000 g | Freq: Once | INTRAVENOUS | Status: AC
  Administered 2021-02-10: 1 g via INTRAVENOUS
  Filled 2021-02-10: qty 10

## 2021-02-10 MED ORDER — GUAIFENESIN-DM 100-10 MG/5ML PO SYRP
5.0000 mL | ORAL_SOLUTION | ORAL | Status: DC | PRN
  Filled 2021-02-10: qty 5

## 2021-02-10 NOTE — ED Notes (Signed)
Patient transported to CT 

## 2021-02-10 NOTE — ED Provider Notes (Addendum)
[facility] EMERGENCY DEPARTMENT Provider Note   CSN: [id number] Arrival date & time: 02/10/21  1731     History Chief Complaint  Patient presents with   Shortness of Breath    Paul Mclean is a 60 y.o. male.  Patient followed by hematology oncology.  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  He stage III.  Started his cycle 3 treatment infusions on October 3.  Patient was not having significant shortness of breath that time no oxygen requirement.  Patient with worsening shortness of breath.  Here with oxygen requirement hypoxic on room air.  Requiring 4 L of oxygen to have a sats in the low 90s.  Patient MRI brain September 20 negative for any metastatic disease.  Patient denies any leg swelling.  No significant chest pain other than with the cough.  They documented that his room air sats at best were 89%.      Past Medical History:  Diagnosis Date   History of radiation therapy 11/12/2020   right lung  09/30/2020-11/12/2020  Dr Gery Pray   Hypothyroidism    Thyroid disease     Patient Active Problem List   Diagnosis Date Noted   Falls frequently 01/12/2021   Encounter for antineoplastic immunotherapy 12/07/2020   Stage III squamous cell carcinoma of right lung ([place]) 09/08/2020   Encounter for antineoplastic chemotherapy 09/08/2020   Lung mass 08/20/2020   Perineal abscess 08/25/2017    Past Surgical History:  Procedure Laterality Date   APPENDECTOMY     BRONCHIAL BIOPSY  09/01/2020   Procedure: BRONCHIAL BIOPSIES;  Surgeon: Garner Nash, DO;  Location: [place] [facility];  Service: Pulmonary;;   BRONCHIAL BRUSHINGS  09/01/2020   Procedure: BRONCHIAL BRUSHINGS;  Surgeon: Garner Nash, DO;  Location: [place];  Service: Pulmonary;;   BRONCHIAL NEEDLE ASPIRATION BIOPSY  09/01/2020   Procedure: BRONCHIAL NEEDLE ASPIRATION BIOPSIES;  Surgeon: Garner Nash, DO;  Location: [place] [facility];  Service: Pulmonary;;   BRONCHIAL WASHINGS   09/01/2020   Procedure: BRONCHIAL WASHINGS;  Surgeon: Garner Nash, DO;  Location: [place];  Service: Pulmonary;;   HERNIA REPAIR     INCISION AND DRAINAGE ABSCESS N/A 08/25/2017   Procedure: INCISION AND DRAINAGE perineal  ABSCESS;  Surgeon: Festus Aloe, MD;  Location: [facility];  Service: Urology;  Laterality: N/A;   VIDEO BRONCHOSCOPY WITH ENDOBRONCHIAL ULTRASOUND N/A 09/01/2020   Procedure: VIDEO BRONCHOSCOPY WITH ENDOBRONCHIAL ULTRASOUND;  Surgeon: Garner Nash, DO;  Location: [place];  Service: Pulmonary;  Laterality: N/A;       Family History  Problem Relation Age of Onset   Cancer Mother     Social History   Tobacco Use   Smoking status: Former    Packs/day: 2.00    Years: 30.00    Pack years: 60.00    Types: Cigarettes   Smokeless tobacco: Never  Vaping Use   Vaping Use: Never used  Substance Use Topics   Alcohol use: Yes    Alcohol/week: 1.0 - 2.0 standard drink    Types: 1 - 2 Cans of beer per week    Comment: few beers everyday   Drug use: Never    Home Medications Prior to Admission medications   Medication Sig Start Date End Date Taking? Authorizing Provider  HYDROcodone bit-homatropine (HYCODAN) 5-1.5 MG/5ML syrup Take 5 mLs by mouth every 6 (six) hours as needed for cough. 02/08/21   Curt Bears, MD  levothyroxine (SYNTHROID) 112 MCG  tablet Take 112 mcg by mouth in the morning. 06/22/20   [provider]  LORazepam (ATIVAN) 0.5 MG tablet Take 1 tablet (0.5 mg total) by mouth at bedtime. 10/20/20   Gery Pray, MD  ondansetron (ZOFRAN) 8 MG tablet Take 1 tablet (8 mg total) by mouth every 8 (eight) hours as needed for nausea or vomiting. 01/12/21   Heilingoetter, Cassandra L, PA-C  prochlorperazine (COMPAZINE) 10 MG tablet TAKE 1 TABLET(10 MG) BY MOUTH EVERY 6 HOURS AS NEEDED FOR NAUSEA OR VOMITING 01/25/21   Heilingoetter, Cassandra L, PA-C  umeclidinium-vilanterol (ANORO ELLIPTA) 62.5-25 MCG/INH AEPB Inhale 1 puff into the lungs  daily. 09/17/20   Icard, Octavio Graves, DO  zolpidem (AMBIEN) 5 MG tablet Take 1 tablet (5 mg total) by mouth at bedtime as needed for sleep. 12/24/20   Gery Pray, MD    Allergies    Patient has no known allergies.  Review of Systems   Review of Systems  Constitutional:  Negative for chills and fever.  HENT:  Negative for ear pain and sore throat.   Eyes:  Negative for pain and visual disturbance.  Respiratory:  Positive for cough and shortness of breath.   Cardiovascular:  Negative for chest pain and palpitations.  Gastrointestinal:  Negative for abdominal pain and vomiting.  Genitourinary:  Negative for dysuria and hematuria.  Musculoskeletal:  Negative for arthralgias and back pain.  Skin:  Negative for color change and rash.  Neurological:  Negative for seizures and syncope.  All other systems reviewed and are negative.  Physical Exam Updated Vital Signs BP 125/86   Pulse 88   Temp 98.1 F (36.7 C) (Oral)   Resp (!) 28   SpO2 93%   Physical Exam Vitals and nursing note reviewed.  Constitutional:      General: He is not in acute distress.    Appearance: Normal appearance. He is well-developed.  HENT:     Head: Normocephalic and atraumatic.  Eyes:     Extraocular Movements: Extraocular movements intact.     Conjunctiva/sclera: Conjunctivae normal.     Pupils: Pupils are equal, round, and reactive to light.  Cardiovascular:     Rate and Rhythm: Normal rate and regular rhythm.     Heart sounds: No murmur heard. Pulmonary:     Effort: Pulmonary effort is normal. No respiratory distress.     Breath sounds: Normal breath sounds. No wheezing, rhonchi or rales.  Abdominal:     Palpations: Abdomen is soft.     Tenderness: There is no abdominal tenderness.  Musculoskeletal:        General: No swelling. Normal range of motion.     Cervical back: Normal range of motion and neck supple.  Skin:    General: Skin is warm and dry.     Capillary Refill: Capillary refill takes  less than 2 seconds.  Neurological:     General: No focal deficit present.     Mental Status: He is alert and oriented to person, place, and time.    ED Results / Procedures / Treatments   Labs (all labs ordered are listed, but only abnormal results are displayed) Labs Reviewed  RESP PANEL BY RT-PCR (FLU A&B, COVID) [id number]  CBC WITH DIFFERENTIAL/PLATELET  COMPREHENSIVE METABOLIC PANEL    EKG EKG Interpretation  Date/Time:  Wednesday February 10 2021 17:39:18 EDT Ventricular Rate:  93 PR Interval:  143 QRS Duration: 89 QT Interval:  344 QTC Calculation: 428 R Axis:   80 Text Interpretation: Sinus  rhythm No significant change since last tracing Confirmed by Fredia Sorrow [phone]) on 02/10/2021 6:20:44 PM  Radiology DG Chest Port 1 View  Result Date: 02/10/2021 CLINICAL DATA:  Shortness of breath. EXAM: PORTABLE CHEST 1 VIEW COMPARISON:  September 01, 2020. FINDINGS: Normal cardiac size. No pneumothorax or pleural effusion is noted. Left lung is clear. Bony thorax is unremarkable. Right lung opacity is noted concerning for pneumonia or atelectasis. Right hilar prominence is noted consistent with right hilar malignancy. IMPRESSION: Right hilar prominence is noted consistent with right hilar malignancy. Increased right lung opacity is noted concerning for pneumonia or atelectasis. Electronically Signed   By: Marijo Conception M.D.   On: 02/10/2021 18:15    Procedures Procedures   Medications Ordered in ED Medications - No data to display  ED Course  I have reviewed the triage vital signs and the nursing notes.  Pertinent labs & imaging results that were available during my care of the patient were reviewed by me and considered in my medical decision making (see chart for details).    MDM Rules/Calculators/A&P                         CRITICAL CARE Performed by: Fredia Sorrow Total critical care time: 45 minutes Critical care time was exclusive of separately billable procedures  and treating other patients. Critical care was necessary to treat or prevent imminent or life-threatening deterioration. Critical care was time spent personally by me on the following activities: development of treatment plan with patient and/or surrogate as well as nursing, discussions with consultants, evaluation of patient's response to treatment, examination of patient, obtaining history from patient or surrogate, ordering and performing treatments and interventions, ordering and review of laboratory studies, ordering and review of radiographic studies, pulse oximetry and re-evaluation of patient's condition.   Patient on 4 L of oxygen satting 95%.  I dropped him down to 3.  And will see how his sats do.  Chest x-ray here shows right hilar prominence noted to be consistent with right however malignant malignancy increased right lung opacity noted concerning for pneumonia or atelectasis.  Patient is also at high risk for pulmonary embolus.  Will get CT angio chest to get labs.  Patient with a new oxygen requirement probably is got a require admission.  CT scan shows no evidence of pulmonary embolus.  But shows multifocal right-sided pneumonia.  Left side appears normal.  The lung mass appears somewhat improved.  We will start him on Rocephin and azithromycin and arrange for admission for the hypoxia.   Final Clinical Impression(s) / ED Diagnoses Final diagnoses:  Shortness of breath  Squamous cell carcinoma of lung, unspecified laterality ([place])  Hypoxia    Rx / DC Orders ED Discharge Orders     None        Fredia Sorrow, MD 02/10/21 2024    Fredia Sorrow, MD 02/10/21 2025

## 2021-02-10 NOTE — ED Triage Notes (Signed)
Pt from home via [facility]. Pt reports SHOB has been getting worse. Pt 89% on RA. Pt with hx of stage 3 lung cancer

## 2021-02-10 NOTE — H&P (Signed)
History and Physical  Paul Mclean:[id number] DOB: 1960/09/07 DOA: 02/10/2021  Referring physician: Fredia Sorrow, MD PCP: [name], MD  Patient coming from: Home  Chief Complaint: Shortness of breath  HPI: Paul Mclean is a 60 y.o. male with medical history significant for stage III squamous cell carcinoma of right lung (T3, N2, M0), hypothyroidism who presents to the emergency department due to 3-day onset of worsening shortness of breath and several days onset of cough with associated chest pain.  Patient complained of recent baseline of slight shortness of breath due to radiation therapy in which he was placed on Anoro Ellipta but did not require supplemental oxygen.  He underwent cycle 3 treatment infusion on 10/3.  Patient activated EMS today due to the worsening shortness of breath and chest pain.  He denies fever, chills, headache, nausea, vomiting.  ED Course:  In the emergency department, O2 sat was noted to be 89% on room air, supplemental oxygen at 4 LPM which was subsequently reduced to 3 LPM with good saturations was provided.  He was noted to be tachypneic, but otherwise vital signs were within normal range.  Work-up in the ED showed normal CBC and normal BMP except for mild hyponatremia, albumin 3.0.  Influenza A, B, SARS coronavirus 2 was negative. CT angiography of the chest with contrast showed no evidence of pulmonary embolus but showed moderate severity multifocal right upper lobe, right middle lobe and right lower lobe infiltrates.   Chest x-ray showed right hilar prominence is noted consistent with right hilar malignancy. Increased right lung opacity is noted concerning for pneumonia or atelectasis. Patient was started on IV ceftriaxone and azithromycin.  Hospitalist was asked to admit patient for further evaluation and management.  Review of Systems: Constitutional: Negative for chills and fever.  HENT: Negative for ear pain and sore throat.   Eyes: Negative for  pain and visual disturbance.  Respiratory: Positive for cough and shortness of breath.   Cardiovascular: Negative for chest pain and palpitations.  Gastrointestinal: Negative for abdominal pain and vomiting.  Endocrine: Negative for polyphagia and polyuria.  Genitourinary: Negative for decreased urine volume, dysuria, enuresis Musculoskeletal: Negative for arthralgias and back pain.  Skin: Negative for color change and rash.  Allergic/Immunologic: Negative for immunocompromised state.  Neurological: Negative for tremors, syncope, speech difficulty, weakness, light-headedness and headaches.  Hematological: Does not bruise/bleed easily.  All other systems reviewed and are negative  Past Medical History:  Diagnosis Date   History of radiation therapy 11/12/2020   right lung  09/30/2020-11/12/2020  Dr Gery Pray   Hypothyroidism    Thyroid disease    Past Surgical History:  Procedure Laterality Date   APPENDECTOMY     BRONCHIAL BIOPSY  09/01/2020   Procedure: BRONCHIAL BIOPSIES;  Surgeon: Garner Nash, DO;  Location: [place] [facility];  Service: Pulmonary;;   BRONCHIAL BRUSHINGS  09/01/2020   Procedure: BRONCHIAL BRUSHINGS;  Surgeon: Garner Nash, DO;  Location: [place] [facility];  Service: Pulmonary;;   BRONCHIAL NEEDLE ASPIRATION BIOPSY  09/01/2020   Procedure: BRONCHIAL NEEDLE ASPIRATION BIOPSIES;  Surgeon: Garner Nash, DO;  Location: [place] [facility];  Service: Pulmonary;;   BRONCHIAL WASHINGS  09/01/2020   Procedure: BRONCHIAL WASHINGS;  Surgeon: Garner Nash, DO;  Location: [place] [facility];  Service: Pulmonary;;   HERNIA REPAIR     INCISION AND DRAINAGE ABSCESS N/A 08/25/2017   Procedure: INCISION AND DRAINAGE perineal  ABSCESS;  Surgeon: Festus Aloe, MD;  Location: [facility];  Service: Urology;  Laterality: N/A;  VIDEO BRONCHOSCOPY WITH ENDOBRONCHIAL ULTRASOUND N/A 09/01/2020   Procedure: VIDEO BRONCHOSCOPY WITH ENDOBRONCHIAL ULTRASOUND;  Surgeon: Garner Nash, DO;  Location:  [place];  Service: Pulmonary;  Laterality: N/A;    Social History:  reports that he has quit smoking. His smoking use included cigarettes. He has a 60.00 pack-year smoking history. He has never used smokeless tobacco. He reports current alcohol use of about 1.0 - 2.0 standard drink per week. He reports that he does not use drugs.   No Known Allergies  Family History  Problem Relation Age of Onset   Cancer Mother      Prior to Admission medications   Medication Sig Start Date End Date Taking? Authorizing Provider  HYDROcodone bit-homatropine (HYCODAN) 5-1.5 MG/5ML syrup Take 5 mLs by mouth every 6 (six) hours as needed for cough. 02/08/21  Yes Curt Bears, MD  levothyroxine (SYNTHROID) 112 MCG tablet Take 112 mcg by mouth in the morning. 06/22/20  Yes [provider]  LORazepam (ATIVAN) 0.5 MG tablet Take 1 tablet (0.5 mg total) by mouth at bedtime. 10/20/20  Yes Gery Pray, MD  prochlorperazine (COMPAZINE) 10 MG tablet TAKE 1 TABLET(10 MG) BY MOUTH EVERY 6 HOURS AS NEEDED FOR NAUSEA OR VOMITING Patient taking differently: Take 10 mg by mouth every 6 (six) hours as needed for nausea or vomiting. 01/25/21  Yes Heilingoetter, Cassandra L, PA-C  umeclidinium-vilanterol (ANORO ELLIPTA) 62.5-25 MCG/INH AEPB Inhale 1 puff into the lungs daily. 09/17/20  Yes Icard, Bradley L, DO  ondansetron (ZOFRAN) 8 MG tablet Take 1 tablet (8 mg total) by mouth every 8 (eight) hours as needed for nausea or vomiting. Patient not taking: Reported on 02/10/2021 01/12/21   Heilingoetter, Cassandra L, PA-C  zolpidem (AMBIEN) 5 MG tablet Take 1 tablet (5 mg total) by mouth at bedtime as needed for sleep. Patient not taking: No sig reported 12/24/20   Gery Pray, MD    Physical Exam: BP 121/86   Pulse 79   Temp 98.1 F (36.7 C) (Oral)   Resp (!) 25   SpO2 96%   General: 60 y.o. year-old male well developed well nourished in no acute distress.  Alert and oriented x3. HEENT: NCAT, EOMI Neck:  Supple, trachea medial Cardiovascular: Regular rate and rhythm with no rubs or gallops.  No thyromegaly or JVD noted.  No lower extremity edema. 2/4 pulses in all 4 extremities. Respiratory: Diffuse rhonchi in right lobes.  No wheezes.  Abdomen: Soft, nontender, nondistended with normal bowel sounds x4 quadrants. Muskuloskeletal: No cyanosis, clubbing or edema noted bilaterally Neuro: CN II-XII intact, strength 5/5 x 4, sensation, reflexes intact Skin: No ulcerative lesions noted or rashes Psychiatry: Judgement and insight appear normal. Mood is appropriate for condition and setting          Labs on Admission:  Basic Metabolic Panel: Recent Labs  Lab 02/08/21 0829 02/10/21 1731  NA 135 133*  K 4.0 3.7  CL 102 100  CO2 23 26  GLUCOSE 171* 129*  BUN 11 13  CREATININE 0.87 0.82  CALCIUM 9.3 8.7*   Liver Function Tests: Recent Labs  Lab 02/08/21 0829 02/10/21 1731  AST 21 20  ALT 25 21  ALKPHOS 72 58  BILITOT 0.5 0.3  PROT 7.8 7.6  ALBUMIN 3.0* 3.0*   No results for input(s): LIPASE, AMYLASE in the last 168 hours. No results for input(s): AMMONIA in the last 168 hours. CBC: Recent Labs  Lab 02/08/21 0829 02/10/21 1731  WBC 7.1 5.6  NEUTROABS 4.9  3.6  HGB 14.7 14.1  HCT 42.9 41.2  MCV 86.3 89.2  PLT 312 337   Cardiac Enzymes: No results for input(s): CKTOTAL, CKMB, CKMBINDEX, TROPONINI in the last 168 hours.  BNP (last 3 results) No results for input(s): BNP in the last 8760 hours.  ProBNP (last 3 results) No results for input(s): PROBNP in the last 8760 hours.  CBG: No results for input(s): GLUCAP in the last 168 hours.  Radiological Exams on Admission: CT Angio Chest PE W/Cm &/Or Wo Cm  Result Date: 02/10/2021 CLINICAL DATA:  Chest pain and shortness of breath. EXAM: CT ANGIOGRAPHY CHEST WITH CONTRAST TECHNIQUE: Multidetector CT imaging of the chest was performed using the standard protocol during bolus administration of intravenous contrast.  Multiplanar CT image reconstructions and MIPs were obtained to evaluate the vascular anatomy. CONTRAST:  39mL OMNIPAQUE IOHEXOL 350 MG/ML SOLN COMPARISON:  December 03, 2020 FINDINGS: Cardiovascular: Satisfactory opacification of the pulmonary arteries to the segmental level. No evidence of pulmonary embolism. Normal heart size. No pericardial effusion. Mediastinum/Nodes: There is mild right hilar lymphadenopathy. The right hilar mass seen on the prior study is decreased in size and currently measures 1.6 cm x 1.1 cm x 0.8 cm (axial CT image 41, CT series 4). This measured 4.2 cm x 2.7 cm x 2.9 cm on the prior study. Thyroid gland, trachea, and esophagus demonstrate no significant findings. Lungs/Pleura: Marked severity multifocal infiltrates are seen within the right upper lobe, right middle lobe and right lower lobe. A trace amount of atelectasis is seen within the inferior aspect of the left upper lobe and posterior aspect of the left lung base. There is no evidence of a pleural effusion or pneumothorax. Upper Abdomen: No acute abnormality. Musculoskeletal: Multilevel degenerative changes seen throughout the thoracic spine. Review of the MIP images confirms the above findings. IMPRESSION: 1. Marked severity multifocal right upper lobe, right middle lobe and right lower lobe infiltrates. 2. No evidence of pulmonary embolus. 3. Interval decrease in size of a anterior right hilar mass seen on the prior study. Electronically Signed   By: Virgina Norfolk M.D.   On: 02/10/2021 20:17   DG Chest Port 1 View  Result Date: 02/10/2021 CLINICAL DATA:  Shortness of breath. EXAM: PORTABLE CHEST 1 VIEW COMPARISON:  September 01, 2020. FINDINGS: Normal cardiac size. No pneumothorax or pleural effusion is noted. Left lung is clear. Bony thorax is unremarkable. Right lung opacity is noted concerning for pneumonia or atelectasis. Right hilar prominence is noted consistent with right hilar malignancy. IMPRESSION: Right hilar  prominence is noted consistent with right hilar malignancy. Increased right lung opacity is noted concerning for pneumonia or atelectasis. Electronically Signed   By: Marijo Conception M.D.   On: 02/10/2021 18:15    EKG: I independently viewed the EKG done and my findings are as followed: Normal sinus rhythm at a rate of 93 bpm  Assessment/Plan Present on Admission:  CAP (community acquired pneumonia)  Stage III squamous cell carcinoma of right lung ([place])  Principal Problem:   CAP (community acquired pneumonia) Active Problems:   Stage III squamous cell carcinoma of right lung ([facility])   Acute respiratory failure with hypoxia ([facility])   Hypoalbuminemia due to protein-calorie malnutrition ([facility])   Hyponatremia   Hypothyroidism   Acute respiratory failure with hypoxia secondary to CAP POA CT angiography of the chest with contrast showed moderate severity multifocal right upper lobe, right middle lobe and right lower lobe infiltrates.   PORT/PSI of 89 points  indicating  2.9-2.8% mortality Patient was started on IV ceftriaxone and azithromycin, we shall continue with same at this time with plan to discontinue/de-escalate based on blood culture, sputum culture, procalcitonin, urine Legionella and strep pneumo Continue Mucinex, Robitussin, incentive spirometry, flutter valve  Continue supplemental oxygen to maintain O2 sat >92% with plan to wean patient off supplemental oxygen as tolerated (patient does not use supplemental oxygen at baseline)  Hypoalbuminemia secondary to moderate protein calorie malnutrition Albumin 3.0, protein supplement will be provided  Hyponatremia Na 133, continue gentle hydration  Hypothyroidism Continue Synthroid  Stage III squamous cell carcinoma of right lung Patient follows with Dr. Earlie Server.  Last treatment cycle was on 02/08/2021.   Patient was on Imfinzi 1500 mg in sodium chloride 0.9% 100 mL chemoinfusion   DVT prophylaxis: Lovenox  Code Status: Full  code  Family Communication: None at bedside  Disposition Plan:  Patient is from:                        home Anticipated DC to:                   SNF or family members home Anticipated DC date:               2-3 days Anticipated DC barriers:          Patient requires inpatient management due to hypoxia requiring supplemental oxygen in the setting of multifocal pneumonia.  Consults called: None  Admission status: Inpatient    Bernadette Hoit MD [facility]  02/10/2021, 10:17 PM

## 2021-02-11 LAB — MAGNESIUM: Magnesium: 2 mg/dL (ref 1.7–2.4)

## 2021-02-11 LAB — STREP PNEUMONIAE URINARY ANTIGEN: Strep Pneumo Urinary Antigen: NEGATIVE

## 2021-02-11 LAB — PROTIME-INR
INR: 1.2 (ref 0.8–1.2)
Prothrombin Time: 15.1 seconds (ref 11.4–15.2)

## 2021-02-11 LAB — COMPREHENSIVE METABOLIC PANEL
ALT: 20 U/L (ref 0–44)
AST: 17 U/L (ref 15–41)
Albumin: 2.8 g/dL — ABNORMAL LOW (ref 3.5–5.0)
Alkaline Phosphatase: 60 U/L (ref 38–126)
Anion gap: 6 (ref 5–15)
BUN: 13 mg/dL (ref 6–20)
CO2: 31 mmol/L (ref 22–32)
Calcium: 8.6 mg/dL — ABNORMAL LOW (ref 8.9–10.3)
Chloride: 99 mmol/L (ref 98–111)
Creatinine, Ser: 0.86 mg/dL (ref 0.61–1.24)
GFR, Estimated: >60 mL/min (ref >60)
Glucose, Bld: 104 mg/dL — ABNORMAL HIGH (ref 70–99)
Potassium: 4.3 mmol/L (ref 3.5–5.1)
Sodium: 136 mmol/L (ref 135–145)
Total Bilirubin: 0.7 mg/dL (ref 0.3–1.2)
Total Protein: 7 g/dL (ref 6.5–8.1)

## 2021-02-11 LAB — CBC
HCT: 40.1 % (ref 39.0–52.0)
Hemoglobin: 13.4 g/dL (ref 13.0–17.0)
MCH: 30.7 pg (ref 26.0–34.0)
MCHC: 33.4 g/dL (ref 30.0–36.0)
MCV: 92 fL (ref 80.0–100.0)
Platelets: 311 10*3/uL (ref 150–400)
RBC: 4.36 MIL/uL (ref 4.22–5.81)
RDW: 11.6 % (ref 11.5–15.5)
WBC: 5.4 10*3/uL (ref 4.0–10.5)
nRBC: 0 % (ref 0.0–0.2)

## 2021-02-11 LAB — PROCALCITONIN: Procalcitonin: <0.1 ng/mL

## 2021-02-11 LAB — HIV ANTIBODY (ROUTINE TESTING W REFLEX): HIV Screen 4th Generation wRfx: NONREACTIVE

## 2021-02-11 LAB — APTT: aPTT: 34 seconds (ref 24–36)

## 2021-02-11 LAB — PHOSPHORUS: Phosphorus: 3.7 mg/dL (ref 2.5–4.6)

## 2021-02-11 LAB — T4, FREE: Free T4: 0.95 ng/dL (ref 0.61–1.12)

## 2021-02-11 MED ORDER — ALBUTEROL SULFATE (2.5 MG/3ML) 0.083% IN NEBU: 2.5000 mg | INHALATION_SOLUTION | Freq: Four times a day (QID) | RESPIRATORY_TRACT | Status: DC | PRN

## 2021-02-11 MED ORDER — SODIUM CHLORIDE 0.9 % IV SOLN: INTRAVENOUS | Status: DC

## 2021-02-11 MED ORDER — UMECLIDINIUM-VILANTEROL 62.5-25 MCG/INH IN AEPB
1.0000 | INHALATION_SPRAY | Freq: Every day | RESPIRATORY_TRACT | Status: DC
  Administered 2021-02-11 – 2021-02-12 (×2): 1 via RESPIRATORY_TRACT
  Filled 2021-02-11: qty 14

## 2021-02-11 MED ORDER — LEVOTHYROXINE SODIUM 112 MCG PO TABS: 112.0000 ug | ORAL_TABLET | Freq: Every morning | ORAL | Status: DC

## 2021-02-11 MED ORDER — ONDANSETRON HCL 4 MG/2ML IJ SOLN: 4.0000 mg | Freq: Three times a day (TID) | INTRAMUSCULAR | Status: DC | PRN

## 2021-02-11 MED ORDER — LEVOTHYROXINE SODIUM 112 MCG PO TABS
112.0000 ug | ORAL_TABLET | Freq: Every morning | ORAL | Status: DC
  Administered 2021-02-11 – 2021-02-12 (×2): 112 ug via ORAL
  Filled 2021-02-11 (×2): qty 1

## 2021-02-11 NOTE — ED Notes (Signed)
Attempted to call report x1. Secretary stated that nurse would call back in 10 minutes.

## 2021-02-11 NOTE — Progress Notes (Signed)
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  Patient complained of recent baseline of slight shortness of breath due to radiation therapy in which he was placed on Anoro Ellipta but did not require supplemental oxygen.  He underwent cycle 3 treatment infusion on 10/3.  Patient activated EMS today due to the worsening shortness of breath and chest pain.  He denies fever, chills, headache, nausea, vomiting.  In the ED, temperature 98.1 F, HR 95, RR 22, BP 124/87, SPO2 89% on room air.  Sodium 133, potassium 3.7, chloride 100, CO2 26, BUN 13, creatinine 0.82, glucose 129.  AST 20, ALT 21, total bilirubin 0.3.  WBC count 5.6, hemoglobin 14.1, platelets 337.  COVID-19 PCR negative.  Influenza A/B PCR negative.  Chest x-ray with right hilar prominence consistent with right hilar malignancy and increased right lung opacity concerning for pneumonia.  CT angiogram chest with marked severity multifocal right upper lobe, right middle lobe and right lower lobe infiltrates, no pulmonary embolism, interval decreased size of anterior right hilar mass.  Patient was started on IV ceftriaxone/azithromycin.  TRH consulted for further evaluation management of acute hypoxic respiratory failure secondary to pneumonia.   Assessment & Plan:   Principal Problem:   CAP (community acquired pneumonia) Active Problems:   Stage III squamous cell carcinoma of right lung (HCC)   Acute respiratory failure with hypoxia (HCC)   Hypoalbuminemia due to protein-calorie malnutrition (HCC)   Hyponatremia   Hypothyroidism   Acute hypoxic respiratory  failure, POA Community Acquired, suspect atypical vs gram negative organism Patient presented with progressive shortness of breath and was noted to be tachypneic and hypoxic on ED presentation.  Complicated by current chemotherapy/immunotherapy for right lung malignancy.  Noted to be hypoxic with SpO2 89% on room air requiring supplemental oxygen to maintain adequate oxygenation. Imaging notable for severe multifocal right upper/lower/middle lobe infiltrates.  No pulmonary embolism on CTA.  COVID/influenza PCR negative.  Suspect Etiology likely Gram Negative organism versus atypical infection.  --Blood cultures x2: Pending --Sputum culture: Pending --Urine Legionella/strep antigen: Pending --Azithromycin 500 mg IV every 24 hours --Ceftriaxone 1 g IV every 24 hours --Continue supplemental oxygen, maintain SPO2 greater than 92%, on 3 L nasal cannula --Incentive spirometry, flutter valve  Hyponatremia Sodium 133 on admission, likely secondary to poor oral intake in the setting of active infection as above. --Na 133>136 -- Continue IV fluid hydration  Hypothyroidism TSH 0.181 on 02/08/2021. --check FT4 --Levothyroxine 15mcg PO daily  Stage III squamous cell carcinoma of right lung (T3, N2, M0) Patient follows with medical oncology outpatient, Dr. Earlie Server.  Diagnosed April 2022.  Has previously completed chemoradiation on 12/14/2020.  Continues on immunotherapy with Imfinzi 1500Mg  IV q4 weeks; completed cycle #3 on 10/3.  Continue outpatient follow-up medical oncology.  DVT prophylaxis: enoxaparin (LOVENOX) injection 40 mg Start: 02/11/21 1000 SCDs Start: 02/10/21 2149   Code Status: Full Code Family Communication: No family present at bedside this morning.  Disposition Plan:  Level of care: Med-Surg Status is: Inpatient  Remains inpatient appropriate because:Ongoing diagnostic testing needed not appropriate for outpatient work up, Unsafe d/c plan, IV treatments appropriate  due to  intensity of illness or inability to take PO, and Inpatient level of care appropriate due to severity of illness  Dispo: The patient is from: Home              Anticipated d/c is to: Home              Patient currently is not medically stable to d/c.   Difficult to place patient No   Consultants:  None  Procedures:  None  Antimicrobials:  Azithromycin 10/5>> Ceftriaxone 10/5>>   Subjective: Patient seen examined bedside, resting comfortably.  Continues in ED holding area.  Shortness of breath slightly improved and less tachypneic this morning.  Continues on 3 L nasal cannula in which he is not on oxygen at baseline.  Requesting something for cough.  No other questions or concerns at this time.  Denies headache, no dizziness, no chest pain, palpitations, no fever/chills/night sweats, no nausea/vomiting/diarrhea, no abdominal pain, no weakness, no fatigue, no paresthesias.  No acute events overnight per nursing staff.  Objective: Vitals:   02/11/21 0730 02/11/21 0800 02/11/21 0830 02/11/21 1030  BP: 127/87 124/84 (!) 138/99 123/83  Pulse: 81 86 90 87  Resp: (!) 33 (!) 28 (!) 22 (!) 27  Temp:      TempSrc:      SpO2: 92% 92% 91% 94%    Intake/Output Summary (Last 24 hours) at 02/11/2021 1051 Last data filed at 02/11/2021 1034 Gross per 24 hour  Intake 752.25 ml  Output --  Net 752.25 ml   There were no vitals filed for this visit.  Examination:  General exam: Appears calm and comfortable  Respiratory system: Coarse breath sounds right upper/middle/lower lobe with slight decrease sounds right base, left side clear without wheezes/crackles, normal respiratory effort without accessory muscle use, on 3 L nasal cannula with SPO2 92% at rest. Cardiovascular system: S1 & S2 heard, RRR. No JVD, murmurs, rubs, gallops or clicks. No pedal edema. Gastrointestinal system: Abdomen is nondistended, soft and nontender. No organomegaly or masses felt. Normal bowel sounds heard. Central  nervous system: Alert and oriented. No focal neurological deficits. Extremities: Symmetric 5 x 5 power. Skin: No rashes, lesions or ulcers Psychiatry: Judgement and insight appear normal. Mood & affect appropriate.     Data Reviewed: I have personally reviewed following labs and imaging studies  CBC: Recent Labs  Lab 02/08/21 0829 02/10/21 1731 02/11/21 0340  WBC 7.1 5.6 5.4  NEUTROABS 4.9 3.6  --   HGB 14.7 14.1 13.4  HCT 42.9 41.2 40.1  MCV 86.3 89.2 92.0  PLT 312 337 263   Basic Metabolic Panel: Recent Labs  Lab 02/08/21 0829 02/10/21 1731 02/11/21 0340  NA 135 133* 136  K 4.0 3.7 4.3  CL 102 100 99  CO2 23 26 31   GLUCOSE 171* 129* 104*  BUN 11 13 13   CREATININE 0.87 0.82 0.86  CALCIUM 9.3 8.7* 8.6*  MG  --   --  2.0  PHOS  --   --  3.7   GFR: Estimated Creatinine Clearance: 101.6 mL/min (by C-G formula based on SCr of 0.86 mg/dL). Liver Function Tests: Recent Labs  Lab 02/08/21 0829 02/10/21 1731 02/11/21 0340  AST 21 20 17   ALT 25 21 20   ALKPHOS 72 58 60  BILITOT 0.5 0.3 0.7  PROT 7.8 7.6 7.0  ALBUMIN 3.0* 3.0* 2.8*   No results for input(s): LIPASE, AMYLASE in the last 168 hours. No results for input(s): AMMONIA in the last  168 hours. Coagulation Profile: Recent Labs  Lab 02/11/21 0340  INR 1.2   Cardiac Enzymes: No results for input(s): CKTOTAL, CKMB, CKMBINDEX, TROPONINI in the last 168 hours. BNP (last 3 results) No results for input(s): PROBNP in the last 8760 hours. HbA1C: No results for input(s): HGBA1C in the last 72 hours. CBG: No results for input(s): GLUCAP in the last 168 hours. Lipid Profile: No results for input(s): CHOL, HDL, LDLCALC, TRIG, CHOLHDL, LDLDIRECT in the last 72 hours. Thyroid Function Tests: No results for input(s): TSH, T4TOTAL, FREET4, T3FREE, THYROIDAB in the last 72 hours. Anemia Panel: No results for input(s): VITAMINB12, FOLATE, FERRITIN, TIBC, IRON, RETICCTPCT in the last 72 hours. Sepsis Labs: Recent  Labs  Lab 02/11/21 0340  PROCALCITON <0.10    Recent Results (from the past 240 hour(s))  Resp Panel by RT-PCR (Flu A&B, Covid) Nasopharyngeal Swab     Status: None   Collection Time: 02/10/21  7:44 PM   Specimen: Nasopharyngeal Swab; Nasopharyngeal(NP) swabs in vial transport medium  Result Value Ref Range Status   SARS Coronavirus 2 by RT PCR NEGATIVE NEGATIVE Final    Comment: (NOTE) SARS-CoV-2 target nucleic acids are NOT DETECTED.  The SARS-CoV-2 RNA is generally detectable in upper respiratory specimens during the acute phase of infection. The lowest concentration of SARS-CoV-2 viral copies this assay can detect is 138 copies/mL. A negative result does not preclude SARS-Cov-2 infection and should not be used as the sole basis for treatment or other patient management decisions. A negative result may occur with  improper specimen collection/handling, submission of specimen other than nasopharyngeal swab, presence of viral mutation(s) within the areas targeted by this assay, and inadequate number of viral copies(<138 copies/mL). A negative result must be combined with clinical observations, patient history, and epidemiological information. The expected result is Negative.  Fact Sheet for Patients:  EntrepreneurPulse.com.au  Fact Sheet for Healthcare Providers:  IncredibleEmployment.be  This test is no t yet approved or cleared by the [place] FDA and  has been authorized for detection and/or diagnosis of SARS-CoV-2 by FDA under an Emergency Use Authorization (EUA). This EUA will remain  in effect (meaning this test can be used) for the duration of the COVID-19 declaration under Section 564(b)(1) of the Act, 21 U.S.C.section 360bbb-3(b)(1), unless the authorization is terminated  or revoked sooner.       Influenza A by PCR NEGATIVE NEGATIVE Final   Influenza B by PCR NEGATIVE NEGATIVE Final    Comment: (NOTE) The Xpert Xpress  SARS-CoV-2/FLU/RSV plus assay is intended as an aid in the diagnosis of influenza from Nasopharyngeal swab specimens and should not be used as a sole basis for treatment. Nasal washings and aspirates are unacceptable for Xpert Xpress SARS-CoV-2/FLU/RSV testing.  Fact Sheet for Patients: EntrepreneurPulse.com.au  Fact Sheet for Healthcare Providers: IncredibleEmployment.be  This test is not yet approved or cleared by the [place] FDA and has been authorized for detection and/or diagnosis of SARS-CoV-2 by FDA under an Emergency Use Authorization (EUA). This EUA will remain in effect (meaning this test can be used) for the duration of the COVID-19 declaration under Section 564(b)(1) of the Act, 21 U.S.C. section 360bbb-3(b)(1), unless the authorization is terminated or revoked.  Performed at [facility], [address], [place], [place] [address]   Culture, blood (Routine X 2) w Reflex to ID Panel     Status: None (Preliminary result)   Collection Time: 02/10/21 10:57 PM   Specimen: BLOOD  Result Value Ref Range Status  Specimen Description BLOOD LEFT ANTECUBITAL  Final   Special Requests   Final    BOTTLES DRAWN AEROBIC AND ANAEROBIC Blood Culture adequate volume   Culture   Final    NO GROWTH < 12 HOURS Performed at [facility], [address] [place], [place] [address]    Report Status PENDING  Incomplete  Culture, blood (Routine X 2) w Reflex to ID Panel     Status: None (Preliminary result)   Collection Time: 02/10/21 10:57 PM   Specimen: BLOOD  Result Value Ref Range Status   Specimen Description BLOOD BLOOD RIGHT HAND  Final   Special Requests   Final    BOTTLES DRAWN AEROBIC AND ANAEROBIC Blood Culture adequate volume   Culture   Final    NO GROWTH < 12 HOURS Performed at [facility], [address], [place], [place] [address]    Report Status PENDING  Incomplete         Radiology Studies: CT Angio Chest PE W/Cm &/Or  Wo Cm  Result Date: 02/10/2021 CLINICAL DATA:  Chest pain and shortness of breath. EXAM: CT ANGIOGRAPHY CHEST WITH CONTRAST TECHNIQUE: Multidetector CT imaging of the chest was performed using the standard protocol during bolus administration of intravenous contrast. Multiplanar CT image reconstructions and MIPs were obtained to evaluate the vascular anatomy. CONTRAST:  76mL OMNIPAQUE IOHEXOL 350 MG/ML SOLN COMPARISON:  December 03, 2020 FINDINGS: Cardiovascular: Satisfactory opacification of the pulmonary arteries to the segmental level. No evidence of pulmonary embolism. Normal heart size. No pericardial effusion. Mediastinum/Nodes: There is mild right hilar lymphadenopathy. The right hilar mass seen on the prior study is decreased in size and currently measures 1.6 cm x 1.1 cm x 0.8 cm (axial CT image 41, CT series 4). This measured 4.2 cm x 2.7 cm x 2.9 cm on the prior study. Thyroid gland, trachea, and esophagus demonstrate no significant findings. Lungs/Pleura: Marked severity multifocal infiltrates are seen within the right upper lobe, right middle lobe and right lower lobe. A trace amount of atelectasis is seen within the inferior aspect of the left upper lobe and posterior aspect of the left lung base. There is no evidence of a pleural effusion or pneumothorax. Upper Abdomen: No acute abnormality. Musculoskeletal: Multilevel degenerative changes seen throughout the thoracic spine. Review of the MIP images confirms the above findings. IMPRESSION: 1. Marked severity multifocal right upper lobe, right middle lobe and right lower lobe infiltrates. 2. No evidence of pulmonary embolus. 3. Interval decrease in size of a anterior right hilar mass seen on the prior study. Electronically Signed   By: Virgina Norfolk M.D.   On: 02/10/2021 20:17   DG Chest Port 1 View  Result Date: 02/10/2021 CLINICAL DATA:  Shortness of breath. EXAM: PORTABLE CHEST 1 VIEW COMPARISON:  September 01, 2020. FINDINGS: Normal cardiac size.  No pneumothorax or pleural effusion is noted. Left lung is clear. Bony thorax is unremarkable. Right lung opacity is noted concerning for pneumonia or atelectasis. Right hilar prominence is noted consistent with right hilar malignancy. IMPRESSION: Right hilar prominence is noted consistent with right hilar malignancy. Increased right lung opacity is noted concerning for pneumonia or atelectasis. Electronically Signed   By: Marijo Conception M.D.   On: 02/10/2021 18:15        Scheduled Meds:  dextromethorphan-guaiFENesin  1 tablet Oral BID   enoxaparin (LOVENOX) injection  40 mg Subcutaneous Q24H   feeding supplement  237 mL Oral BID BM   Continuous Infusions:  azithromycin 500 mg (  02/11/21 1036)   cefTRIAXone (ROCEPHIN)  IV Stopped (02/11/21 1034)     LOS: 1 day    Time spent: 39 minutes spent on chart review, discussion with nursing staff, consultants, updating family and interview/physical exam; more than 50% of that time was spent in counseling and/or coordination of care.    Indica Marcott J [place] DO [facility] Available via Epic secure chat 7am-7pm After these hours, please refer to coverage provider listed on [email or web address] 02/11/2021, 10:51 AM

## 2021-02-11 NOTE — ED Notes (Signed)
Called RT for update on inhaler administration. RT stated that she was waiting for inhaler from pharmacy.

## 2021-02-11 NOTE — ED Notes (Signed)
Spouse updated on transfer to floor.

## 2021-02-12 LAB — BASIC METABOLIC PANEL
Anion gap: 6 (ref 5–15)
BUN: 11 mg/dL (ref 6–20)
CO2: 29 mmol/L (ref 22–32)
Calcium: 8.5 mg/dL — ABNORMAL LOW (ref 8.9–10.3)
Chloride: 101 mmol/L (ref 98–111)
Creatinine, Ser: 0.74 mg/dL (ref 0.61–1.24)
GFR, Estimated: >60 mL/min (ref >60)
Glucose, Bld: 100 mg/dL — ABNORMAL HIGH (ref 70–99)
Potassium: 4 mmol/L (ref 3.5–5.1)
Sodium: 136 mmol/L (ref 135–145)

## 2021-02-12 LAB — LEGIONELLA PNEUMOPHILA SEROGP 1 UR AG: L. pneumophila Serogp 1 Ur Ag: NEGATIVE

## 2021-02-12 MED ORDER — AZITHROMYCIN 500 MG PO TABS: 500.0000 mg | ORAL_TABLET | Freq: Every day | ORAL | 0 refills | Status: AC

## 2021-02-12 MED ORDER — ANORO ELLIPTA 62.5-25 MCG/INH IN AEPB: 1.0000 | INHALATION_SPRAY | Freq: Every day | RESPIRATORY_TRACT | 2 refills | Status: AC

## 2021-02-12 MED ORDER — DM-GUAIFENESIN ER 30-600 MG PO TB12: 1.0000 | ORAL_TABLET | Freq: Two times a day (BID) | ORAL | 0 refills | Status: AC

## 2021-02-12 MED ORDER — ALBUTEROL SULFATE HFA 108 (90 BASE) MCG/ACT IN AERS: 2.0000 | INHALATION_SPRAY | Freq: Four times a day (QID) | RESPIRATORY_TRACT | 2 refills | Status: DC | PRN

## 2021-02-12 MED ORDER — ZOLPIDEM TARTRATE 5 MG PO TABS: 5.0000 mg | ORAL_TABLET | Freq: Every evening | ORAL | Status: DC | PRN

## 2021-02-12 MED ORDER — CEFDINIR 300 MG PO CAPS: 300.0000 mg | ORAL_CAPSULE | Freq: Two times a day (BID) | ORAL | 0 refills | Status: AC

## 2021-02-12 NOTE — Discharge Summary (Signed)
Physician Discharge Summary  Paul Mclean KCL:[id number] DOB: August 29, 1960 DOA: 02/10/2021  PCP: [name], MD  Admit date: 02/10/2021 Discharge date: 02/12/2021  Admitted From: Home Disposition: Home  Recommendations for Outpatient Follow-up:  Follow up with PCP in 1-2 weeks Follow-up with medical oncology, Dr. Julien Nordmann as scheduled Continue azithromycin and cefdinir to complete course of antibiotics for commune acquired pneumonia Please follow up on the following pending results: Urine Legionella antigen that was pending at time of discharge, and finalized blood cultures which showed no growth x2 days during hospitalization.  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  Patient complained of recent baseline of slight shortness of breath due to radiation therapy in which he was placed on Anoro Ellipta but did not require supplemental oxygen.  He underwent cycle 3 treatment infusion on 10/3.  Patient activated EMS today due to the worsening shortness of breath and chest pain.  He denies fever, chills, headache, nausea, vomiting.   In the ED, temperature 98.1 F, HR 95, RR 22, BP 124/87, SPO2 89% on room air.  Sodium 133, potassium 3.7, chloride 100, CO2 26, BUN 13, creatinine 0.82, glucose 129.  AST 20, ALT 21, total bilirubin 0.3.  WBC count 5.6, hemoglobin 14.1, platelets 337.  COVID-19 PCR negative.  Influenza A/B PCR negative.  Chest x-ray with right hilar prominence consistent with right hilar malignancy and increased right lung opacity concerning for pneumonia.  CT angiogram  chest with marked severity multifocal right upper lobe, right middle lobe and right lower lobe infiltrates, no pulmonary embolism, interval decreased size of anterior right hilar mass.  Patient was started on IV ceftriaxone/azithromycin.  TRH consulted for further evaluation management of acute hypoxic respiratory failure secondary to pneumonia.  Hospital course:  Acute hypoxic respiratory failure, POA Community Acquired, suspect atypical vs gram negative organism Patient presented with progressive shortness of breath and was noted to be tachypneic and hypoxic on ED presentation.  Complicated by current chemotherapy/immunotherapy for right lung malignancy.  Noted to be hypoxic with SpO2 89% on room air requiring supplemental oxygen to maintain adequate oxygenation. Imaging notable for severe multifocal right upper/lower/middle lobe infiltrates.  No pulmonary embolism on CTA.  COVID/influenza PCR negative.  Suspect Etiology likely Gram Negative organism versus atypical infection.  Patient was started on IV azithromycin and ceftriaxone with marked improvement of symptoms.  Blood culture showed no growth x2 days during hospitalization.  Strep urine antigen negative.  Legionella urinary antigen pending at time of discharge.  Discharging on azithromycin 500 mg p.o. daily to complete 5-day course and cefdinir 300 mg p.o. twice daily to complete 7-day course.  Patient was screened for need for home oxygen, and only desaturated slightly to 89% on ambulation with quick recovery; does not meet criteria for home O2 needs at this time.  Recommend continue incentive spirometry and flutter valve at home.  Outpatient follow-up with PCP.   Hyponatremia Sodium 133 on admission, likely secondary to poor oral intake in the setting of active infection as above.  Patient was started on IV fluid hydration with improvement of sodium to 136 at time of discharge.  Continue  encourage increase oral intake.   Hypothyroidism TSH  0.181 on 02/08/2021.  Free T4 0.95.  Continue home levothyroxine 112 mcg p.o. daily.   Stage III squamous cell carcinoma of right lung (T3, N2, M0) Patient follows with medical oncology outpatient, Dr. Earlie Server.  Diagnosed April 2022.  Has previously completed chemoradiation on 12/14/2020.  Continues on immunotherapy with Imfinzi 1500Mg  IV q4 weeks; completed cycle #3 on 10/3.  Continue outpatient follow-up medical oncology.  Discharge Diagnoses:  Principal Problem:   CAP (community acquired pneumonia) Active Problems:   Stage III squamous cell carcinoma of right lung (HCC)   Hypoalbuminemia due to protein-calorie malnutrition [facility])   Hypothyroidism    Discharge Instructions  Discharge Instructions     Call MD for:  difficulty breathing, headache or visual disturbances   Complete by: As directed    Call MD for:  extreme fatigue   Complete by: As directed    Call MD for:  persistant dizziness or light-headedness   Complete by: As directed    Call MD for:  persistant nausea and vomiting   Complete by: As directed    Call MD for:  severe uncontrolled pain   Complete by: As directed    Call MD for:  temperature >100.4   Complete by: As directed    Diet - low sodium heart healthy   Complete by: As directed    Increase activity slowly   Complete by: As directed       Allergies as of 02/12/2021   No Known Allergies      Medication List     STOP taking these medications    ondansetron 8 MG tablet Commonly known as: ZOFRAN   zolpidem 5 MG tablet Commonly known as: AMBIEN       TAKE these medications    albuterol 108 (90 Base) MCG/ACT inhaler Commonly known as: VENTOLIN HFA Inhale 2 puffs into the lungs every 6 (six) hours as needed for wheezing or shortness of breath.   Anoro Ellipta 62.5-25 MCG/INH Aepb Generic drug: umeclidinium-vilanterol Inhale 1 puff into the lungs daily.   azithromycin 500 MG tablet Commonly known as: Zithromax Take 1 tablet (500 mg  total) by mouth daily for 3 days. Take 1 tablet daily for 3 days. Start taking on: February 13, 2021   cefdinir 300 MG capsule Commonly known as: OMNICEF Take 1 capsule (300 mg total) by mouth 2 (two) times daily for 5 days. Start taking on: February 13, 2021   dextromethorphan-guaiFENesin 30-600 MG 12hr tablet Commonly known as: MUCINEX DM Take 1 tablet by mouth 2 (two) times daily for 7 days.   HYDROcodone bit-homatropine 5-1.5 MG/5ML syrup Commonly known as: Hycodan Take 5 mLs by mouth every 6 (six) hours as needed for cough.   levothyroxine 112 MCG tablet Commonly known as: SYNTHROID Take 112 mcg by mouth in the morning.   LORazepam 0.5 MG tablet Commonly known as: ATIVAN Take 1 tablet (0.5 mg total) by mouth at bedtime.   prochlorperazine 10 MG tablet Commonly known as: COMPAZINE TAKE 1 TABLET(10 MG) BY MOUTH EVERY 6 HOURS AS NEEDED FOR NAUSEA OR VOMITING What changed: See the new instructions.        Follow-up Information     [name], MD. Schedule an appointment as soon as possible for a visit in 1 week(s).   Specialty: Internal Medicine Contact information: [address] [place] [address] [phone]  No Known Allergies  Consultations: None   Procedures/Studies: CT Angio Chest PE W/Cm &/Or Wo Cm  Result Date: 02/10/2021 CLINICAL DATA:  Chest pain and shortness of breath. EXAM: CT ANGIOGRAPHY CHEST WITH CONTRAST TECHNIQUE: Multidetector CT imaging of the chest was performed using the standard protocol during bolus administration of intravenous contrast. Multiplanar CT image reconstructions and MIPs were obtained to evaluate the vascular anatomy. CONTRAST:  10mL OMNIPAQUE IOHEXOL 350 MG/ML SOLN COMPARISON:  December 03, 2020 FINDINGS: Cardiovascular: Satisfactory opacification of the pulmonary arteries to the segmental level. No evidence of pulmonary embolism. Normal heart size. No pericardial effusion. Mediastinum/Nodes: There is  mild right hilar lymphadenopathy. The right hilar mass seen on the prior study is decreased in size and currently measures 1.6 cm x 1.1 cm x 0.8 cm (axial CT image 41, CT series 4). This measured 4.2 cm x 2.7 cm x 2.9 cm on the prior study. Thyroid gland, trachea, and esophagus demonstrate no significant findings. Lungs/Pleura: Marked severity multifocal infiltrates are seen within the right upper lobe, right middle lobe and right lower lobe. A trace amount of atelectasis is seen within the inferior aspect of the left upper lobe and posterior aspect of the left lung base. There is no evidence of a pleural effusion or pneumothorax. Upper Abdomen: No acute abnormality. Musculoskeletal: Multilevel degenerative changes seen throughout the thoracic spine. Review of the MIP images confirms the above findings. IMPRESSION: 1. Marked severity multifocal right upper lobe, right middle lobe and right lower lobe infiltrates. 2. No evidence of pulmonary embolus. 3. Interval decrease in size of a anterior right hilar mass seen on the prior study. Electronically Signed   By: Virgina Norfolk M.D.   On: 02/10/2021 20:17   MR Brain W Wo Contrast  Result Date: 01/26/2021 CLINICAL DATA:  Metastatic disease evaluation, lung cancer EXAM: MRI HEAD WITHOUT AND WITH CONTRAST TECHNIQUE: Multiplanar, multiecho pulse sequences of the brain and surrounding structures were obtained without and with intravenous contrast. CONTRAST:  38mL GADAVIST GADOBUTROL 1 MMOL/ML IV SOLN COMPARISON:  09/16/2020 FINDINGS: Brain: No acute infarction, intracranial hemorrhage, mass, mass effect, or midline shift. No hydrocephalus or extra-axial fluid collection. Ventricles and sulci are within normal limits for age. No abnormal enhancement. Vascular: Normal flow voids. Skull and upper cervical spine: Normal marrow signal. Sinuses/Orbits: Mucosal thickening in the frontal sinuses. The orbits are unremarkable. Other: Redemonstrated susceptibility artifact along  the left frontal scalp, of indeterminate etiology. The mastoids are well aerated. IMPRESSION: No evidence of intracranial metastatic disease. Electronically Signed   By: Merilyn Baba M.D.   On: 01/26/2021 03:42   DG Chest Port 1 View  Result Date: 02/10/2021 CLINICAL DATA:  Shortness of breath. EXAM: PORTABLE CHEST 1 VIEW COMPARISON:  September 01, 2020. FINDINGS: Normal cardiac size. No pneumothorax or pleural effusion is noted. Left lung is clear. Bony thorax is unremarkable. Right lung opacity is noted concerning for pneumonia or atelectasis. Right hilar prominence is noted consistent with right hilar malignancy. IMPRESSION: Right hilar prominence is noted consistent with right hilar malignancy. Increased right lung opacity is noted concerning for pneumonia or atelectasis. Electronically Signed   By: Marijo Conception M.D.   On: 02/10/2021 18:15     Subjective: Patient seen examined at bedside, resting comfortably.  No specific complaints this morning.  Overall feels much improved and states ready for discharge home.  Has been titrated off of oxygen at rest, and on ambulation today did not desaturate less than 89% with quick recovery.  Continues with  mild nonproductive cough.  No other questions or concerns at this time.  Denies headache, no dizziness, no chest pain, palpitations, no nausea/vomiting/diarrhea, no abdominal pain, no weakness, no fatigue, no paresthesias.  No acute events overnight per nursing staff.  Discharge Exam: Vitals:   02/12/21 0809 02/12/21 0820  BP:    Pulse:    Resp:    Temp:    SpO2: 95% 93%   Vitals:   02/11/21 1958 02/12/21 0806 02/12/21 0809 02/12/21 0820  BP: 137/82     Pulse: 89     Resp: 20     Temp: 98.2 F (36.8 C)     TempSrc: Oral     SpO2: 92% (!) 88% 95% 93%  Weight: 90.7 kg     Height: 5\' 8"  (1.727 m)       General: Pt is alert, awake, not in acute distress Cardiovascular: RRR, S1/S2 +, no rubs, no gallops Respiratory: Coarse lung sounds right  upper/middle/lower lobe, no wheezing, left-sided lung fields clear without crackles/wheezing, normal [place] effort, no accessory muscle use, on room air Abdominal: Soft, NT, ND, bowel sounds + Extremities: no edema, no cyanosis    The results of significant diagnostics from this hospitalization (including imaging, microbiology, ancillary and laboratory) are listed below for reference.     Microbiology: Recent Results (from the past 240 hour(s))  Resp Panel by RT-PCR (Flu A&B, Covid) Nasopharyngeal Swab     Status: None   Collection Time: 02/10/21  7:44 PM   Specimen: Nasopharyngeal Swab; Nasopharyngeal(NP) swabs in vial transport medium  Result Value Ref Mclean Status   SARS Coronavirus 2 by RT PCR NEGATIVE NEGATIVE Final    Comment: (NOTE) SARS-CoV-2 target nucleic acids are NOT DETECTED.  The SARS-CoV-2 RNA is generally detectable in upper respiratory specimens during the acute phase of infection. The lowest concentration of SARS-CoV-2 viral copies this assay can detect is 138 copies/mL. A negative result does not preclude SARS-Cov-2 infection and should not be used as the sole basis for treatment or other patient management decisions. A negative result may occur with  improper specimen collection/handling, submission of specimen other than nasopharyngeal swab, presence of viral mutation(s) within the areas targeted by this assay, and inadequate number of viral copies(<138 copies/mL). A negative result must be combined with clinical observations, patient history, and epidemiological information. The expected result is Negative.  Fact Sheet for Patients:  EntrepreneurPulse.com.au  Fact Sheet for Healthcare Providers:  IncredibleEmployment.be  This test is no t yet approved or cleared by the [place] FDA and  has been authorized for detection and/or diagnosis of SARS-CoV-2 by FDA under an Emergency Use Authorization (EUA). This EUA will  remain  in effect (meaning this test can be used) for the duration of the COVID-19 declaration under Section 564(b)(1) of the Act, 21 U.S.C.section 360bbb-3(b)(1), unless the authorization is terminated  or revoked sooner.       Influenza A by PCR NEGATIVE NEGATIVE Final   Influenza B by PCR NEGATIVE NEGATIVE Final    Comment: (NOTE) The Xpert Xpress SARS-CoV-2/FLU/RSV plus assay is intended as an aid in the diagnosis of influenza from Nasopharyngeal swab specimens and should not be used as a sole basis for treatment. Nasal washings and aspirates are unacceptable for Xpert Xpress SARS-CoV-2/FLU/RSV testing.  Fact Sheet for Patients: EntrepreneurPulse.com.au  Fact Sheet for Healthcare Providers: IncredibleEmployment.be  This test is not yet approved or cleared by the [place] FDA and has been authorized for detection and/or diagnosis of SARS-CoV-2 by FDA  under an Emergency Use Authorization (EUA). This EUA will remain in effect (meaning this test can be used) for the duration of the COVID-19 declaration under Section 564(b)(1) of the Act, 21 U.S.C. section 360bbb-3(b)(1), unless the authorization is terminated or revoked.  Performed at [facility], [address], [place], [place] [address]   Culture, blood (Routine X 2) w Reflex to ID Panel     Status: None (Preliminary result)   Collection Time: 02/10/21 10:57 PM   Specimen: BLOOD  Result Value Ref Mclean Status   Specimen Description BLOOD LEFT ANTECUBITAL  Final   Special Requests   Final    BOTTLES DRAWN AEROBIC AND ANAEROBIC Blood Culture adequate volume   Culture   Final    NO GROWTH 2 DAYS Performed at [facility], [address] [place], [place] [address]    Report Status PENDING  Incomplete  Culture, blood (Routine X 2) w Reflex to ID Panel     Status: None (Preliminary result)   Collection Time: 02/10/21 10:57 PM   Specimen: BLOOD  Result Value Ref Mclean Status    Specimen Description BLOOD BLOOD RIGHT HAND  Final   Special Requests   Final    BOTTLES DRAWN AEROBIC AND ANAEROBIC Blood Culture adequate volume   Culture   Final    NO GROWTH 2 DAYS Performed at [facility], [address] [place],  [address]    Report Status PENDING  Incomplete     Labs: BNP (last 3 results) No results for input(s): BNP in the last 8760 hours. Basic Metabolic Panel: Recent Labs  Lab 02/08/21 0829 02/10/21 1731 02/11/21 0340 02/12/21 0601  NA 135 133* 136 136  K 4.0 3.7 4.3 4.0  CL 102 100 99 101  CO2 23 26 31 29   GLUCOSE 171* 129* 104* 100*  BUN 11 13 13 11   CREATININE 0.87 0.82 0.86 0.74  CALCIUM 9.3 8.7* 8.6* 8.5*  MG  --   --  2.0  --   PHOS  --   --  3.7  --    Liver Function Tests: Recent Labs  Lab 02/08/21 0829 02/10/21 1731 02/11/21 0340  AST 21 20 17   ALT 25 21 20   ALKPHOS 72 58 60  BILITOT 0.5 0.3 0.7  PROT 7.8 7.6 7.0  ALBUMIN 3.0* 3.0* 2.8*   No results for input(s): LIPASE, AMYLASE in the last 168 hours. No results for input(s): AMMONIA in the last 168 hours. CBC: Recent Labs  Lab 02/08/21 0829 02/10/21 1731 02/11/21 0340  WBC 7.1 5.6 5.4  NEUTROABS 4.9 3.6  --   HGB 14.7 14.1 13.4  HCT 42.9 41.2 40.1  MCV 86.3 89.2 92.0  PLT 312 337 311   Cardiac Enzymes: No results for input(s): CKTOTAL, CKMB, CKMBINDEX, TROPONINI in the last 168 hours. BNP: Invalid input(s): POCBNP CBG: No results for input(s): GLUCAP in the last 168 hours. D-Dimer No results for input(s): DDIMER in the last 72 hours. Hgb A1c No results for input(s): HGBA1C in the last 72 hours. Lipid Profile No results for input(s): CHOL, HDL, LDLCALC, TRIG, CHOLHDL, LDLDIRECT in the last 72 hours. Thyroid function studies No results for input(s): TSH, T4TOTAL, T3FREE, THYROIDAB in the last 72 hours.  Invalid input(s): FREET3 Anemia work up No results for input(s): VITAMINB12, FOLATE, FERRITIN, TIBC, IRON, RETICCTPCT in the last 72  hours. Urinalysis    Component Value Date/Time   COLORURINE YELLOW 08/24/2017 0050   APPEARANCEUR CLEAR 08/24/2017 0050   LABSPEC >1.046 (  H) 08/24/2017 0050   PHURINE 5.0 08/24/2017 0050   GLUCOSEU NEGATIVE 08/24/2017 0050   HGBUR NEGATIVE 08/24/2017 0050   BILIRUBINUR NEGATIVE 08/24/2017 0050   KETONESUR NEGATIVE 08/24/2017 0050   PROTEINUR NEGATIVE 08/24/2017 0050   UROBILINOGEN 0.2 11/21/2009 2316   NITRITE NEGATIVE 08/24/2017 0050   LEUKOCYTESUR NEGATIVE 08/24/2017 0050   Sepsis Labs Invalid input(s): PROCALCITONIN,  WBC,  LACTICIDVEN Microbiology Recent Results (from the past 240 hour(s))  Resp Panel by RT-PCR (Flu A&B, Covid) Nasopharyngeal Swab     Status: None   Collection Time: 02/10/21  7:44 PM   Specimen: Nasopharyngeal Swab; Nasopharyngeal(NP) swabs in vial transport medium  Result Value Ref Mclean Status   SARS Coronavirus 2 by RT PCR NEGATIVE NEGATIVE Final    Comment: (NOTE) SARS-CoV-2 target nucleic acids are NOT DETECTED.  The SARS-CoV-2 RNA is generally detectable in upper respiratory specimens during the acute phase of infection. The lowest concentration of SARS-CoV-2 viral copies this assay can detect is 138 copies/mL. A negative result does not preclude SARS-Cov-2 infection and should not be used as the sole basis for treatment or other patient management decisions. A negative result may occur with  improper specimen collection/handling, submission of specimen other than nasopharyngeal swab, presence of viral mutation(s) within the areas targeted by this assay, and inadequate number of viral copies(<138 copies/mL). A negative result must be combined with clinical observations, patient history, and epidemiological information. The expected result is Negative.  Fact Sheet for Patients:  EntrepreneurPulse.com.au  Fact Sheet for Healthcare Providers:  IncredibleEmployment.be  This test is no t yet approved or cleared  by the [place] FDA and  has been authorized for detection and/or diagnosis of SARS-CoV-2 by FDA under an Emergency Use Authorization (EUA). This EUA will remain  in effect (meaning this test can be used) for the duration of the COVID-19 declaration under Section 564(b)(1) of the Act, 21 U.S.C.section 360bbb-3(b)(1), unless the authorization is terminated  or revoked sooner.       Influenza A by PCR NEGATIVE NEGATIVE Final   Influenza B by PCR NEGATIVE NEGATIVE Final    Comment: (NOTE) The Xpert Xpress SARS-CoV-2/FLU/RSV plus assay is intended as an aid in the diagnosis of influenza from Nasopharyngeal swab specimens and should not be used as a sole basis for treatment. Nasal washings and aspirates are unacceptable for Xpert Xpress SARS-CoV-2/FLU/RSV testing.  Fact Sheet for Patients: EntrepreneurPulse.com.au  Fact Sheet for Healthcare Providers: IncredibleEmployment.be  This test is not yet approved or cleared by the [place] FDA and has been authorized for detection and/or diagnosis of SARS-CoV-2 by FDA under an Emergency Use Authorization (EUA). This EUA will remain in effect (meaning this test can be used) for the duration of the COVID-19 declaration under Section 564(b)(1) of the Act, 21 U.S.C. section 360bbb-3(b)(1), unless the authorization is terminated or revoked.  Performed at [facility], [address], [place], [place] [address]   Culture, blood (Routine X 2) w Reflex to ID Panel     Status: None (Preliminary result)   Collection Time: 02/10/21 10:57 PM   Specimen: BLOOD  Result Value Ref Mclean Status   Specimen Description BLOOD LEFT ANTECUBITAL  Final   Special Requests   Final    BOTTLES DRAWN AEROBIC AND ANAEROBIC Blood Culture adequate volume   Culture   Final    NO GROWTH 2 DAYS Performed at [facility], [address] [place], [place] [address]    Report Status PENDING  Incomplete  Culture, blood (Routine  X 2) w Reflex to ID Panel     Status: None (Preliminary result)   Collection Time: 02/10/21 10:57 PM   Specimen: BLOOD  Result Value Ref Mclean Status   Specimen Description BLOOD BLOOD RIGHT HAND  Final   Special Requests   Final    BOTTLES DRAWN AEROBIC AND ANAEROBIC Blood Culture adequate volume   Culture   Final    NO GROWTH 2 DAYS Performed at [facility], [address] [place], [place] [address]    Report Status PENDING  Incomplete     Time coordinating discharge: Over 30 minutes  SIGNED:   Tonyia Marschall J [place] DO  [facility] 02/12/2021, 9:57 AM

## 2021-02-12 NOTE — Progress Notes (Signed)
Pt on RA at rest 90% Ambulation pt o2 was 89%  Pt was able to rest and deep breathe and o2 level was at 94%

## 2021-02-15 LAB — CULTURE, BLOOD (ROUTINE X 2)
Culture: NO GROWTH
Culture: NO GROWTH
Special Requests: ADEQUATE
Special Requests: ADEQUATE

## 2021-02-24 ENCOUNTER — Telehealth: Payer: Self-pay

## 2021-02-24 ENCOUNTER — Inpatient Hospital Stay (HOSPITAL_COMMUNITY)
Admission: EM | Admit: 2021-02-24 | Discharge: 2021-02-28 | DRG: 871 | Disposition: A | Discharge status: Home / Self Care | Payer: Managed Care, Other (non HMO) | Attending: Internal Medicine | Admitting: Internal Medicine

## 2021-02-24 ENCOUNTER — Observation Stay (HOSPITAL_COMMUNITY): Payer: Managed Care, Other (non HMO)

## 2021-02-24 ENCOUNTER — Encounter (HOSPITAL_COMMUNITY): Payer: Self-pay | Admitting: Emergency Medicine

## 2021-02-24 ENCOUNTER — Emergency Department (HOSPITAL_COMMUNITY): Payer: Managed Care, Other (non HMO)

## 2021-02-24 ENCOUNTER — Other Ambulatory Visit: Payer: Self-pay

## 2021-02-24 ENCOUNTER — Encounter: Payer: Self-pay | Admitting: Emergency Medicine

## 2021-02-24 ENCOUNTER — Ambulatory Visit: Admission: EM | Admit: 2021-02-24 | Discharge: 2021-02-24 | Payer: Managed Care, Other (non HMO)

## 2021-02-24 DIAGNOSIS — A419 Sepsis, unspecified organism: Principal | ICD-10-CM | POA: Diagnosis present

## 2021-02-24 DIAGNOSIS — J9601 Acute respiratory failure with hypoxia: Secondary | ICD-10-CM

## 2021-02-24 DIAGNOSIS — I2694 Multiple subsegmental pulmonary emboli without acute cor pulmonale: Secondary | ICD-10-CM

## 2021-02-24 DIAGNOSIS — R6520 Severe sepsis without septic shock: Secondary | ICD-10-CM | POA: Diagnosis present

## 2021-02-24 DIAGNOSIS — E039 Hypothyroidism, unspecified: Secondary | ICD-10-CM | POA: Diagnosis present

## 2021-02-24 DIAGNOSIS — Y95 Nosocomial condition: Secondary | ICD-10-CM | POA: Diagnosis present

## 2021-02-24 DIAGNOSIS — Z79899 Other long term (current) drug therapy: Secondary | ICD-10-CM | POA: Diagnosis not present

## 2021-02-24 DIAGNOSIS — J189 Pneumonia, unspecified organism: Secondary | ICD-10-CM | POA: Diagnosis present

## 2021-02-24 DIAGNOSIS — Z20822 Contact with and (suspected) exposure to covid-19: Secondary | ICD-10-CM | POA: Diagnosis present

## 2021-02-24 DIAGNOSIS — Z9221 Personal history of antineoplastic chemotherapy: Secondary | ICD-10-CM

## 2021-02-24 DIAGNOSIS — D849 Immunodeficiency, unspecified: Secondary | ICD-10-CM | POA: Diagnosis present

## 2021-02-24 DIAGNOSIS — E871 Hypo-osmolality and hyponatremia: Secondary | ICD-10-CM | POA: Diagnosis present

## 2021-02-24 DIAGNOSIS — I2699 Other pulmonary embolism without acute cor pulmonale: Secondary | ICD-10-CM | POA: Diagnosis not present

## 2021-02-24 DIAGNOSIS — Z79890 Hormone replacement therapy (postmenopausal): Secondary | ICD-10-CM | POA: Diagnosis not present

## 2021-02-24 DIAGNOSIS — I2693 Single subsegmental pulmonary embolism without acute cor pulmonale: Secondary | ICD-10-CM | POA: Diagnosis present

## 2021-02-24 DIAGNOSIS — Z87891 Personal history of nicotine dependence: Secondary | ICD-10-CM | POA: Diagnosis not present

## 2021-02-24 DIAGNOSIS — I82442 Acute embolism and thrombosis of left tibial vein: Secondary | ICD-10-CM | POA: Diagnosis present

## 2021-02-24 DIAGNOSIS — C3491 Malignant neoplasm of unspecified part of right bronchus or lung: Secondary | ICD-10-CM | POA: Diagnosis present

## 2021-02-24 DIAGNOSIS — Z923 Personal history of irradiation: Secondary | ICD-10-CM | POA: Diagnosis not present

## 2021-02-24 DIAGNOSIS — R0902 Hypoxemia: Secondary | ICD-10-CM

## 2021-02-24 DIAGNOSIS — I2609 Other pulmonary embolism with acute cor pulmonale: Secondary | ICD-10-CM | POA: Diagnosis not present

## 2021-02-24 LAB — COMPREHENSIVE METABOLIC PANEL
ALT: 25 U/L (ref 0–44)
AST: 25 U/L (ref 15–41)
Albumin: 2.8 g/dL — ABNORMAL LOW (ref 3.5–5.0)
Alkaline Phosphatase: 72 U/L (ref 38–126)
Anion gap: 9 (ref 5–15)
BUN: 15 mg/dL (ref 6–20)
CO2: 27 mmol/L (ref 22–32)
Calcium: 8.3 mg/dL — ABNORMAL LOW (ref 8.9–10.3)
Chloride: 96 mmol/L — ABNORMAL LOW (ref 98–111)
Creatinine, Ser: 0.77 mg/dL (ref 0.61–1.24)
GFR, Estimated: >60 mL/min (ref >60)
Glucose, Bld: 108 mg/dL — ABNORMAL HIGH (ref 70–99)
Potassium: 4 mmol/L (ref 3.5–5.1)
Sodium: 132 mmol/L — ABNORMAL LOW (ref 135–145)
Total Bilirubin: 0.6 mg/dL (ref 0.3–1.2)
Total Protein: 7.9 g/dL (ref 6.5–8.1)

## 2021-02-24 LAB — CBC WITH DIFFERENTIAL/PLATELET
Abs Immature Granulocytes: 0.04 10*3/uL (ref 0.00–0.07)
Basophils Absolute: 0 10*3/uL (ref 0.0–0.1)
Basophils Relative: 0 %
Eosinophils Absolute: 0.2 10*3/uL (ref 0.0–0.5)
Eosinophils Relative: 3 %
HCT: 43.5 % (ref 39.0–52.0)
Hemoglobin: 14.1 g/dL (ref 13.0–17.0)
Immature Granulocytes: 1 %
Lymphocytes Relative: 14 %
Lymphs Abs: 1 10*3/uL (ref 0.7–4.0)
MCH: 28.7 pg (ref 26.0–34.0)
MCHC: 32.4 g/dL (ref 30.0–36.0)
MCV: 88.4 fL (ref 80.0–100.0)
Monocytes Absolute: 1 10*3/uL (ref 0.1–1.0)
Monocytes Relative: 13 %
Neutro Abs: 5.1 10*3/uL (ref 1.7–7.7)
Neutrophils Relative %: 69 %
Platelets: 277 10*3/uL (ref 150–400)
RBC: 4.92 MIL/uL (ref 4.22–5.81)
RDW: 11.9 % (ref 11.5–15.5)
WBC: 7.3 10*3/uL (ref 4.0–10.5)
nRBC: 0 % (ref 0.0–0.2)

## 2021-02-24 LAB — RESP PANEL BY RT-PCR (FLU A&B, COVID) ARPGX2
Influenza A by PCR: NEGATIVE
Influenza B by PCR: NEGATIVE
SARS Coronavirus 2 by RT PCR: NEGATIVE

## 2021-02-24 LAB — LACTIC ACID, PLASMA: Lactic Acid, Venous: 1.8 mmol/L (ref 0.5–1.9)

## 2021-02-24 MED ORDER — VANCOMYCIN HCL 1750 MG/350ML IV SOLN
1750.0000 mg | Freq: Once | INTRAVENOUS | Status: AC
  Administered 2021-02-24: 1750 mg via INTRAVENOUS
  Filled 2021-02-24 (×2): qty 350

## 2021-02-24 MED ORDER — GUAIFENESIN-CODEINE 100-10 MG/5ML PO SOLN
10.0000 mL | Freq: Four times a day (QID) | ORAL | Status: AC
  Administered 2021-02-24 – 2021-02-25 (×2): 10 mL via ORAL
  Filled 2021-02-24 (×2): qty 10

## 2021-02-24 MED ORDER — GUAIFENESIN-DM 100-10 MG/5ML PO SYRP: 10.0000 mL | ORAL_SOLUTION | Freq: Three times a day (TID) | ORAL | Status: DC | PRN

## 2021-02-24 MED ORDER — IPRATROPIUM-ALBUTEROL 0.5-2.5 (3) MG/3ML IN SOLN
3.0000 mL | Freq: Once | RESPIRATORY_TRACT | Status: AC
  Administered 2021-02-24: 3 mL via RESPIRATORY_TRACT
  Filled 2021-02-24: qty 3

## 2021-02-24 MED ORDER — POLYETHYLENE GLYCOL 3350 17 G PO PACK: 17.0000 g | PACK | Freq: Every day | ORAL | Status: DC | PRN

## 2021-02-24 MED ORDER — ALBUTEROL SULFATE (2.5 MG/3ML) 0.083% IN NEBU
2.5000 mg | INHALATION_SOLUTION | Freq: Once | RESPIRATORY_TRACT | Status: AC
  Administered 2021-02-24: 2.5 mg via RESPIRATORY_TRACT
  Filled 2021-02-24: qty 3

## 2021-02-24 MED ORDER — SODIUM CHLORIDE 0.9 % IV SOLN: INTRAVENOUS | Status: DC

## 2021-02-24 MED ORDER — VANCOMYCIN HCL IN DEXTROSE 1-5 GM/200ML-% IV SOLN
1000.0000 mg | Freq: Two times a day (BID) | INTRAVENOUS | Status: DC
  Administered 2021-02-25: 1000 mg via INTRAVENOUS
  Filled 2021-02-24: qty 200

## 2021-02-24 MED ORDER — SODIUM CHLORIDE 0.9 % IV SOLN
2.0000 g | Freq: Three times a day (TID) | INTRAVENOUS | Status: DC
  Administered 2021-02-24 – 2021-02-27 (×8): 2 g via INTRAVENOUS
  Filled 2021-02-24 (×8): qty 2

## 2021-02-24 MED ORDER — ACETAMINOPHEN 650 MG RE SUPP: 650.0000 mg | Freq: Four times a day (QID) | RECTAL | Status: DC | PRN

## 2021-02-24 MED ORDER — LEVOTHYROXINE SODIUM 112 MCG PO TABS
112.0000 ug | ORAL_TABLET | Freq: Every morning | ORAL | Status: DC
  Administered 2021-02-25 – 2021-02-28 (×4): 112 ug via ORAL
  Filled 2021-02-24 (×5): qty 1

## 2021-02-24 MED ORDER — HEPARIN BOLUS VIA INFUSION
6000.0000 [IU] | Freq: Once | INTRAVENOUS | Status: AC
  Administered 2021-02-24: 6000 [IU] via INTRAVENOUS
  Filled 2021-02-24: qty 6000

## 2021-02-24 MED ORDER — HEPARIN (PORCINE) 25000 UT/250ML-% IV SOLN
2050.0000 [IU]/h | INTRAVENOUS | Status: DC
  Administered 2021-02-24: 1500 [IU]/h via INTRAVENOUS
  Administered 2021-02-25: 1900 [IU]/h via INTRAVENOUS
  Administered 2021-02-26: 2050 [IU]/h via INTRAVENOUS
  Filled 2021-02-24 (×4): qty 250

## 2021-02-24 MED ORDER — ALBUTEROL SULFATE (2.5 MG/3ML) 0.083% IN NEBU: 2.5000 mg | INHALATION_SOLUTION | RESPIRATORY_TRACT | Status: DC | PRN

## 2021-02-24 MED ORDER — ENSURE ENLIVE PO LIQD
237.0000 mL | Freq: Two times a day (BID) | ORAL | Status: DC
  Administered 2021-02-25 – 2021-02-28 (×6): 237 mL via ORAL

## 2021-02-24 MED ORDER — ONDANSETRON HCL 4 MG PO TABS: 4.0000 mg | ORAL_TABLET | Freq: Four times a day (QID) | ORAL | Status: DC | PRN

## 2021-02-24 MED ORDER — GUAIFENESIN-CODEINE 100-10 MG/5ML PO SOLN
10.0000 mL | Freq: Once | ORAL | Status: AC
  Administered 2021-02-24: 10 mL via ORAL
  Filled 2021-02-24: qty 10

## 2021-02-24 MED ORDER — ACETAMINOPHEN 325 MG PO TABS: 650.0000 mg | ORAL_TABLET | Freq: Four times a day (QID) | ORAL | Status: DC | PRN

## 2021-02-24 MED ORDER — SODIUM CHLORIDE 0.9 % IV BOLUS
500.0000 mL | Freq: Once | INTRAVENOUS | Status: AC
  Administered 2021-02-24: 500 mL via INTRAVENOUS

## 2021-02-24 MED ORDER — ONDANSETRON HCL 4 MG/2ML IJ SOLN: 4.0000 mg | Freq: Four times a day (QID) | INTRAMUSCULAR | Status: DC | PRN

## 2021-02-24 MED ORDER — IOHEXOL 350 MG/ML SOLN
100.0000 mL | Freq: Once | INTRAVENOUS | Status: AC | PRN
  Administered 2021-02-24: 100 mL via INTRAVENOUS

## 2021-02-24 NOTE — ED Provider Notes (Signed)
[name]   MRN: [id number] DOB: Jan 23, 1961  Subjective:   Paul Mclean is a 60 y.o. male presenting for ongoing difficulty with his breathing, shob. Was hospitalized 10/05-11/2020 for hypoxic respiratory failure, community acquired pneumonia. Refer to results as below.  He need oxygen therapy in the hospital, completed his course of antibiotics, was not discharge with oxygen therapy.  Has stage III lung cancer, finished both radiation and chemo therapy. No active chest pain in clinic.   No current facility-administered medications for this encounter.  Current Outpatient Medications:    albuterol (VENTOLIN HFA) 108 (90 Base) MCG/ACT inhaler, Inhale 2 puffs into the lungs every 6 (six) hours as needed for wheezing or shortness of breath., Disp: 8 g, Rfl: 2   HYDROcodone bit-homatropine (HYCODAN) 5-1.5 MG/5ML syrup, Take 5 mLs by mouth every 6 (six) hours as needed for cough., Disp: 240 mL, Rfl: 0   levothyroxine (SYNTHROID) 112 MCG tablet, Take 112 mcg by mouth in the morning., Disp: , Rfl:    LORazepam (ATIVAN) 0.5 MG tablet, Take 1 tablet (0.5 mg total) by mouth at bedtime., Disp: 20 tablet, Rfl: 0   prochlorperazine (COMPAZINE) 10 MG tablet, TAKE 1 TABLET(10 MG) BY MOUTH EVERY 6 HOURS AS NEEDED FOR NAUSEA OR VOMITING (Patient taking differently: Take 10 mg by mouth every 6 (six) hours as needed for nausea or vomiting.), Disp: 30 tablet, Rfl: 2   umeclidinium-vilanterol (ANORO ELLIPTA) 62.5-25 MCG/INH AEPB, Inhale 1 puff into the lungs daily., Disp: 30 each, Rfl: 2   No Known Allergies  Past Medical History:  Diagnosis Date   History of radiation therapy 11/12/2020   right lung  09/30/2020-11/12/2020  Dr Gery Pray   Hypothyroidism    Thyroid disease      Past Surgical History:  Procedure Laterality Date   APPENDECTOMY     BRONCHIAL BIOPSY  09/01/2020   Procedure: BRONCHIAL BIOPSIES;  Surgeon: Garner Nash, DO;  Location: [facility];  Service: Pulmonary;;    BRONCHIAL BRUSHINGS  09/01/2020   Procedure: BRONCHIAL BRUSHINGS;  Surgeon: Garner Nash, DO;  Location: [facility];  Service: Pulmonary;;   BRONCHIAL NEEDLE ASPIRATION BIOPSY  09/01/2020   Procedure: BRONCHIAL NEEDLE ASPIRATION BIOPSIES;  Surgeon: Garner Nash, DO;  Location: [facility];  Service: Pulmonary;;   BRONCHIAL WASHINGS  09/01/2020   Procedure: BRONCHIAL WASHINGS;  Surgeon: Garner Nash, DO;  Location: [facility];  Service: Pulmonary;;   HERNIA REPAIR     INCISION AND DRAINAGE ABSCESS N/A 08/25/2017   Procedure: INCISION AND DRAINAGE perineal  ABSCESS;  Surgeon: Festus Aloe, MD;  Location: [facility];  Service: Urology;  Laterality: N/A;   VIDEO BRONCHOSCOPY WITH ENDOBRONCHIAL ULTRASOUND N/A 09/01/2020   Procedure: VIDEO BRONCHOSCOPY WITH ENDOBRONCHIAL ULTRASOUND;  Surgeon: Garner Nash, DO;  Location: [facility];  Service: Pulmonary;  Laterality: N/A;    Family History  Problem Relation Age of Onset   Cancer Mother     Social History   Tobacco Use   Smoking status: Former    Packs/day: 2.00    Years: 30.00    Pack years: 60.00    Types: Cigarettes   Smokeless tobacco: Never  Vaping Use   Vaping Use: Never used  Substance Use Topics   Alcohol use: Yes    Alcohol/week: 1.0 - 2.0 standard drink    Types: 1 - 2 Cans of beer per week    Comment: few beers everyday   Drug use: Never    ROS   Objective:  Vitals: BP 157/82  Pulse oximetry ranged from 70-76% at triage. Placed on 8L of oxygen by nasal cannula which improved the pulse oximetry to 95%.  Tachycardia at 115bpm on triage, improved with 8L of oxygen to 95bpm.   Physical Exam Constitutional:      General: He is not in acute distress.    Appearance: Normal appearance. He is well-developed. He is not ill-appearing, toxic-appearing or diaphoretic.  HENT:     Head: Normocephalic and atraumatic.     Right Ear: External ear normal.     Left Ear: External ear normal.     Nose: Nose  normal.     Mouth/Throat:     Mouth: Mucous membranes are moist.     Pharynx: Oropharynx is clear.  Eyes:     General: No scleral icterus.    Extraocular Movements: Extraocular movements intact.     Pupils: Pupils are equal, round, and reactive to light.  Cardiovascular:     Rate and Rhythm: Normal rate and regular rhythm.     Heart sounds: Normal heart sounds. No murmur heard.   No friction rub. No gallop.  Pulmonary:     Effort: Tachypnea and accessory muscle usage present. No respiratory distress.     Breath sounds: No stridor. Examination of the right-lower field reveals decreased breath sounds. Examination of the left-lower field reveals decreased breath sounds. Decreased breath sounds present. No wheezing, rhonchi or rales.  Neurological:     Mental Status: He is alert and oriented to person, place, and time.  Psychiatric:        Mood and Affect: Mood normal.        Behavior: Behavior normal.        Thought Content: Thought content normal.   CT Chest W Contrast  Result Date: 12/03/2020 CLINICAL DATA:  Primary Cancer Type: Lung Imaging Indication: Assess response to therapy Interval therapy since last imaging? Yes Initial Cancer Diagnosis Date: 09/02/2020; Established by: Biopsy-proven Detailed Pathology: Stage IIIb non-small cell lung cancer, squamous cell carcinoma. Primary Tumor location: Right upper lobe. Surgeries: No thoracic.  Appendectomy. Chemotherapy: Yes; Ongoing? Yes; Most recent administration: 11/10/2020 Immunotherapy? No Radiation therapy? Yes; Date Range: 09/30/2020 - 11/12/2020; Target: Right lung EXAM: CT CHEST WITH CONTRAST TECHNIQUE: Multidetector CT imaging of the chest was performed during intravenous contrast administration. CONTRAST:  45mL OMNIPAQUE IOHEXOL 350 MG/ML SOLN COMPARISON:  09/02/2020 PET-CT.  CT chest 08/10/2020. FINDINGS: Cardiovascular: Normal heart size.  No pericardial effusion. Mediastinum/Nodes: Normal appearance of the thyroid gland. The trachea  appears patent and is midline. Normal appearance of the esophagus. No axillary, supraclavicular, or mediastinal adenopathy. The right hilar mass measures 4.2 x 2.7 by 2.9 cm (volume = 17 cm^3), image 72/2. Formally 6.4 by 2.8 by 3.2 cm (volume = 30 cm^3). Lungs/Pleura: Paraseptal and centrilobular emphysema.No pleural effusion, airspace consolidation, or atelectasis. Stable to improved appearance postobstructive pneumonitis within the anteromedial right upper lobe. Previous FDG avid right upper lobe lung nodule measuring 1.2 cm has resolved in the interval. Upper Abdomen: No acute abnormality. Adrenal glands appear within normal limits. Musculoskeletal: No chest wall abnormality. No acute or significant osseous findings. IMPRESSION: 1. Interval response to therapy. The right hilar mass has decreased in size in the interval. 2. Resolution of previous FDG avid right upper lobe lung nodule. 3. Emphysema. Emphysema (ICD10-J43.9). Electronically Signed   By: Kerby Moors M.D.   On: 12/03/2020 11:42   CT Angio Chest PE W/Cm &/Or Wo Cm  Result Date: 02/10/2021 CLINICAL DATA:  Chest pain and shortness of breath. EXAM: CT ANGIOGRAPHY CHEST WITH CONTRAST TECHNIQUE: Multidetector CT imaging of the chest was performed using the standard protocol during bolus administration of intravenous contrast. Multiplanar CT image reconstructions and MIPs were obtained to evaluate the vascular anatomy. CONTRAST:  70mL OMNIPAQUE IOHEXOL 350 MG/ML SOLN COMPARISON:  December 03, 2020 FINDINGS: Cardiovascular: Satisfactory opacification of the pulmonary arteries to the segmental level. No evidence of pulmonary embolism. Normal heart size. No pericardial effusion. Mediastinum/Nodes: There is mild right hilar lymphadenopathy. The right hilar mass seen on the prior study is decreased in size and currently measures 1.6 cm x 1.1 cm x 0.8 cm (axial CT image 41, CT series 4). This measured 4.2 cm x 2.7 cm x 2.9 cm on the prior study. Thyroid gland,  trachea, and esophagus demonstrate no significant findings. Lungs/Pleura: Marked severity multifocal infiltrates are seen within the right upper lobe, right middle lobe and right lower lobe. A trace amount of atelectasis is seen within the inferior aspect of the left upper lobe and posterior aspect of the left lung base. There is no evidence of a pleural effusion or pneumothorax. Upper Abdomen: No acute abnormality. Musculoskeletal: Multilevel degenerative changes seen throughout the thoracic spine. Review of the MIP images confirms the above findings. IMPRESSION: 1. Marked severity multifocal right upper lobe, right middle lobe and right lower lobe infiltrates. 2. No evidence of pulmonary embolus. 3. Interval decrease in size of a anterior right hilar mass seen on the prior study. Electronically Signed   By: Virgina Norfolk M.D.   On: 02/10/2021 20:17   MR Brain W Wo Contrast  Result Date: 01/26/2021 CLINICAL DATA:  Metastatic disease evaluation, lung cancer EXAM: MRI HEAD WITHOUT AND WITH CONTRAST TECHNIQUE: Multiplanar, multiecho pulse sequences of the brain and surrounding structures were obtained without and with intravenous contrast. CONTRAST:  47mL GADAVIST GADOBUTROL 1 MMOL/ML IV SOLN COMPARISON:  09/16/2020 FINDINGS: Brain: No acute infarction, intracranial hemorrhage, mass, mass effect, or midline shift. No hydrocephalus or extra-axial fluid collection. Ventricles and sulci are within normal limits for age. No abnormal enhancement. Vascular: Normal flow voids. Skull and upper cervical spine: Normal marrow signal. Sinuses/Orbits: Mucosal thickening in the frontal sinuses. The orbits are unremarkable. Other: Redemonstrated susceptibility artifact along the left frontal scalp, of indeterminate etiology. The mastoids are well aerated. IMPRESSION: No evidence of intracranial metastatic disease. Electronically Signed   By: Merilyn Baba M.D.   On: 01/26/2021 03:42   DG Chest Port 1 View  Result Date:  02/10/2021 CLINICAL DATA:  Shortness of breath. EXAM: PORTABLE CHEST 1 VIEW COMPARISON:  September 01, 2020. FINDINGS: Normal cardiac size. No pneumothorax or pleural effusion is noted. Left lung is clear. Bony thorax is unremarkable. Right lung opacity is noted concerning for pneumonia or atelectasis. Right hilar prominence is noted consistent with right hilar malignancy. IMPRESSION: Right hilar prominence is noted consistent with right hilar malignancy. Increased right lung opacity is noted concerning for pneumonia or atelectasis. Electronically Signed   By: Marijo Conception M.D.   On: 02/10/2021 18:15     Assessment and Plan :   PDMP not reviewed this encounter.  1. Acute respiratory failure with hypoxia ([place])   2. Malignant neoplasm of right lung, unspecified part of lung ([place])   3. Community acquired pneumonia of right lung, unspecified part of lung    Patient is failing outpatient management and is presenting with acute respiratory failure with hypoxia.  Was placed on oxygen in clinic as above, attempted an IV  line.  Will have patient transported to the hospital by EMS.  Case reported out, transfer of care completed.   Jaynee Eagles, PA-C 02/24/21 1252

## 2021-02-24 NOTE — Progress Notes (Signed)
Pharmacy Antibiotic Note  Paul Mclean is a 60 y.o. male admitted on 02/24/2021 with pneumonia.  Pharmacy has been consulted for Cefepime and Vancomycin dosing.  Recently completed regimen of azithromycin and ceftriaxone/cefdinir to treat CAP. Will empirically broaden coverage and narrow back down and/or discontinue antibiotics pending new blood and respiratory cultures.  Will initiate vancomycin at 1750mg  followed by 1000mg  Q12H. Estimated AUC 400, Scr 0.77 (rounded to 0.8).  Plan: Vancomycin 1750mg  x1 followed by 1000mg  IV every 12 hours. Goal trough 15-20 mcg/mL. Cefepime 2g IV Q8H  Height: 5\' 8"  (172.7 cm) Weight: 90.7 kg (200 lb) IBW/kg (Calculated) : 68.4  Temp (24hrs), Avg:98 F (36.7 C), Min:97.8 F (36.6 C), Max:98.2 F (36.8 C)  Recent Labs  Lab 02/24/21 1423  WBC 7.3  CREATININE 0.77  LATICACIDVEN 1.8    Estimated Creatinine Clearance: 108.7 mL/min (by C-G formula based on SCr of 0.77 mg/dL).    No Known Allergies   Thank you for allowing pharmacy to be a part of this patient's care.  Ardyth Harps, PharmD Clinical Pharmacist

## 2021-02-24 NOTE — ED Triage Notes (Signed)
Patient presents due to SOB and cough x " going on for a while".   Patient appears to be in tripod position upon assessment.   SpO2 77% on RA.   Mani Pa-C at beside. EMS called.

## 2021-02-24 NOTE — H&P (Signed)
History and Physical    Paul Mclean:[id number] DOB: [birth year] DOA: 02/24/2021  PCP: [name], MD   Patient coming from: Home  I have personally briefly reviewed patient's old medical records in [place]  Chief Complaint: Difficulty breathing  HPI: Paul Mclean is a 60 y.o. male with medical history significant for lung cancer.  Patient presented to the ED with complaints of difficulty breathing and cough of 3 days duration. Patient was recently hospitalized 10/5-10/7 for acute respiratory failure secondary to pneumonia.  He completed 5 days of azithromycin and 7 days of cephalosporins-ceftriaxone>> cefdinir on discharge. Patient here, after he was discharged but then symptoms returned and worsened over the past 2 days.  Quit smoking cigarettes about 20 years ago.  He tells me prior to his recent pneumonia he had not had issues with cough or difficulty breathing.  ED Course:  temp 98.2.  Heart rate 91-113.  Respiratory 21-31.  Blood pressure systolic 196-222.  O2 sats down to 77% on room air. Placed on 3L O2, sats improved to 90 to 95%.  Portable chest x-ray shows right lung opacities without significant change.  ED provider reported wheezing, DuoNeb's given.  Hospitalist to admit.  Review of Systems: As per HPI all other systems reviewed and negative.  Past Medical History:  Diagnosis Date   History of radiation therapy 11/12/2020   right lung  09/30/2020-11/12/2020  Dr Gery Pray   Hypothyroidism    Thyroid disease     Past Surgical History:  Procedure Laterality Date   APPENDECTOMY     BRONCHIAL BIOPSY  09/01/2020   Procedure: BRONCHIAL BIOPSIES;  Surgeon: Garner Nash, DO;  Location: [facility];  Service: Pulmonary;;   BRONCHIAL BRUSHINGS  09/01/2020   Procedure: BRONCHIAL BRUSHINGS;  Surgeon: Garner Nash, DO;  Location: [facility];  Service: Pulmonary;;   BRONCHIAL NEEDLE ASPIRATION BIOPSY  09/01/2020   Procedure: BRONCHIAL NEEDLE ASPIRATION BIOPSIES;   Surgeon: Garner Nash, DO;  Location: [facility];  Service: Pulmonary;;   BRONCHIAL WASHINGS  09/01/2020   Procedure: BRONCHIAL WASHINGS;  Surgeon: Garner Nash, DO;  Location: [facility];  Service: Pulmonary;;   HERNIA REPAIR     INCISION AND DRAINAGE ABSCESS N/A 08/25/2017   Procedure: INCISION AND DRAINAGE perineal  ABSCESS;  Surgeon: Festus Aloe, MD;  Location: [facility];  Service: Urology;  Laterality: N/A;   VIDEO BRONCHOSCOPY WITH ENDOBRONCHIAL ULTRASOUND N/A 09/01/2020   Procedure: VIDEO BRONCHOSCOPY WITH ENDOBRONCHIAL ULTRASOUND;  Surgeon: Garner Nash, DO;  Location: [facility];  Service: Pulmonary;  Laterality: N/A;     reports that he has quit smoking. His smoking use included cigarettes. He has a 60.00 pack-year smoking history. He has never used smokeless tobacco. He reports current alcohol use of about 1.0 - 2.0 standard drink per week. He reports that he does not use drugs.  No Known Allergies  Family History  Problem Relation Age of Onset   Cancer Mother     Prior to Admission medications   Medication Sig Start Date End Date Taking? Authorizing Provider  albuterol (VENTOLIN HFA) 108 (90 Base) MCG/ACT inhaler Inhale 2 puffs into the lungs every 6 (six) hours as needed for wheezing or shortness of breath. 02/12/21   [place] Eric J, DO  HYDROcodone bit-homatropine (HYCODAN) 5-1.5 MG/5ML syrup Take 5 mLs by mouth every 6 (six) hours as needed for cough. 02/08/21   Curt Bears, MD  levothyroxine (SYNTHROID) 112 MCG tablet Take 112 mcg by mouth in the  morning. 06/22/20   [provider]  LORazepam (ATIVAN) 0.5 MG tablet Take 1 tablet (0.5 mg total) by mouth at bedtime. 10/20/20   Gery Pray, MD  prochlorperazine (COMPAZINE) 10 MG tablet TAKE 1 TABLET(10 MG) BY MOUTH EVERY 6 HOURS AS NEEDED FOR NAUSEA OR VOMITING Patient taking differently: Take 10 mg by mouth every 6 (six) hours as needed for nausea or vomiting. 01/25/21   Heilingoetter, Cassandra  L, PA-C  umeclidinium-vilanterol (ANORO ELLIPTA) 62.5-25 MCG/INH AEPB Inhale 1 puff into the lungs daily. 02/12/21 05/13/21  [place] Eric J, DO    Physical Exam: Vitals:   02/24/21 1331 02/24/21 1400 02/24/21 1500 02/24/21 1530  BP: 121/87 94/61 138/81 128/88  Pulse: 91 84 (!) 113 (!) 103  Resp: (!) 26 (!) 22 (!) 25 (!) 24  Temp: 97.8 F (36.6 C)  98.2 F (36.8 C)   TempSrc: Oral  Oral   SpO2: 91% 95% 94% 90%  Weight:      Height:        Constitutional: Acutely ill-appearing  Vitals:   02/24/21 1331 02/24/21 1400 02/24/21 1500 02/24/21 1530  BP: 121/87 94/61 138/81 128/88  Pulse: 91 84 (!) 113 (!) 103  Resp: (!) 26 (!) 22 (!) 25 (!) 24  Temp: 97.8 F (36.6 C)  98.2 F (36.8 C)   TempSrc: Oral  Oral   SpO2: 91% 95% 94% 90%  Weight:      Height:       Eyes: PERRL, lids and conjunctivae normal ENMT: Mucous membranes are moist.  Neck: normal, supple, no masses, no thyromegaly Respiratory: At the time of my evaluation, patient is coughing continuously and significantly resulting in intermittent drop in O2 sats and intermittent increased work of breathing..Diffuse fine crackles right lung.  No wheezing. Cardiovascular: Regular rate and rhythm, no murmurs / rubs / gallops. No extremity edema. 2+ pedal pulses. Abdomen: no tenderness, no masses palpated. No hepatosplenomegaly. Bowel sounds positive.  Musculoskeletal: no clubbing / cyanosis. No joint deformity upper and lower extremities. Good ROM, no contractures. Normal muscle tone.  Skin: no rashes, lesions, ulcers. No induration Neurologic: No apparent cranial nerve abnormality, moving extremities spontaneously. Psychiatric: Normal judgment and insight. Alert and oriented x 3. Normal mood.   Labs on Admission: I have personally reviewed following labs and imaging studies  CBC: Recent Labs  Lab 02/24/21 1423  WBC 7.3  NEUTROABS 5.1  HGB 14.1  HCT 43.5  MCV 88.4  PLT 956   Basic Metabolic Panel: Recent Labs  Lab  02/24/21 1423  NA 132*  K 4.0  CL 96*  CO2 27  GLUCOSE 108*  BUN 15  CREATININE 0.77  CALCIUM 8.3*   GFR: Estimated Creatinine Clearance: 108.7 mL/min (by C-G formula based on SCr of 0.77 mg/dL). Liver Function Tests: Recent Labs  Lab 02/24/21 1423  AST 25  ALT 25  ALKPHOS 72  BILITOT 0.6  PROT 7.9  ALBUMIN 2.8*    Radiological Exams on Admission: DG Chest Port 1 View  Result Date: 02/24/2021 CLINICAL DATA:  Shortness of breath and cough.  Hypoxemia. EXAM: PORTABLE CHEST 1 VIEW COMPARISON:  Radiographs 02/10/2021 and 09/01/2020. CT 02/10/2021 and 12/03/2020. FINDINGS: 1417 hours. There is similar volume loss in the right hemithorax with multifocal airspace opacities in the right lung. The left lung is clear. There is no pleural effusion or pneumothorax. The heart size and mediastinal contours are stable. The bones appear unremarkable. Telemetry leads overlie the chest. IMPRESSION: No significant change in multifocal right lung  opacities with associated volume loss. In this patient with known right lung cancer and recent radiation therapy, these findings are nonspecific and may in part reflect radiation pneumonitis. Superimposed pneumonia difficult to completely exclude, although the left lung remains clear. Correlate clinically. Electronically Signed   By: Richardean Sale M.D.   On: 02/24/2021 14:51    EKG: Independently reviewed. Sinus rate 89. QTc 441.  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  Likely due to combination of both pulmonary embolism and pneumonia. -Suppli O2  Pulmonary embolism-in patients with lung cancer, hence will need indefinite anticoagulation.  Blood pressure stable  mostly 120s to 140s.  CTA chest shows multiple segmental and subsegmental, PE in left upper and lower lobes.  No definite evidence of right heart strain.  Denies history of frequent falls or GI bleed.   -IV Heparin, pharmacy to dose -Obtain echocardiogram  Pneumonia with severe sepsis-worsening cough and dyspnea.  Rules in for severe sepsis with tachypnea and tachycardia- Heart rate mostly 91 -113.  Respiratory rate 21-31, with acute hypoxic respiratory failure.  Recent hospitalization for pneumonia, completed empiric treatment for community-acquired pneumonia.  CTA today with findings concerning for ongoing/worsening multifocal pneumonia.  - I talked to radiologist, CT findings not suggestive of obstructive pneumonia. - IV vancomycin and cefepime -Obtain lactic acid - N/s 100cc/hr x 15hrs -Mucolytic scheduled -Albuterol nebulizer as needed -Immunocompromised -Urine Legionella and urine strep -Expectorated sputum gram stain and culture  Lung cancer-follows with Dr. Earlie Server diagnosed April 2022.  Completed 8 cycles of chemotherapy.  Currently on immunotherapy.  DVT prophylaxis: heparin drip Code Status: Full code Family Communication: Significant otherQuita Skye, at bedside. Disposition Plan: > 2 days Consults called: None Admission status: Inpt, Tele  I certify that at the point of admission it is my clinical judgment that the patient will require inpatient hospital care spanning beyond 2 midnights from the point of admission due to high intensity of service, high risk for further deterioration and high frequency of surveillance required.     Bethena Roys MD [facility]  02/24/2021, 6:28 PM

## 2021-02-24 NOTE — ED Notes (Signed)
Patient is being discharged from the Urgent Care and sent to the Emergency Department via EMS . Per [facility], patient is in need of higher level of care due to SOB , Cough, low oxygen saturation. Patient is aware and verbalizes understanding of plan of care.  Vitals:   02/24/21 1244 02/24/21 1255  BP:    Pulse:    Resp:    SpO2: 97% 94%

## 2021-02-24 NOTE — ED Notes (Signed)
Patient transported to CT 

## 2021-02-24 NOTE — ED Notes (Signed)
Walked patient and his oxygen went down to 77 without the nasal cannula. Put nasal cannula on after and waited til O2 got back in the 90s.

## 2021-02-24 NOTE — ED Provider Notes (Signed)
[facility] EMERGENCY DEPARTMENT Provider Note   CSN: [id number] Arrival date & time: 02/24/21  1308     History Chief Complaint  Patient presents with   Shortness of Breath    Paul Mclean is a 61 y.o. male.  Patient complains of shortness of breath.  He recently got out of the hospital with pneumonia and he has a history of lung cancer.  Patient was at the urgent care with O2 sat in the 70s  The history is provided by the patient and medical records. No language interpreter was used.  Shortness of Breath Severity:  Moderate Onset quality:  Sudden Timing:  Constant Progression:  Worsening Chronicity:  Recurrent Context: activity   Relieved by:  Nothing Worsened by:  Nothing Associated symptoms: no abdominal pain, no chest pain, no cough, no headaches and no rash       Past Medical History:  Diagnosis Date   History of radiation therapy 11/12/2020   right lung  09/30/2020-11/12/2020  Dr Gery Pray   Hypothyroidism    Thyroid disease     Patient Active Problem List   Diagnosis Date Noted   Acute respiratory failure with hypoxia ([place]) 02/24/2021   CAP (community acquired pneumonia) 02/10/2021   Hypoalbuminemia due to protein-calorie malnutrition ([place]) 02/10/2021   Hypothyroidism 02/10/2021   Falls frequently 01/12/2021   Encounter for antineoplastic immunotherapy 12/07/2020   Stage III squamous cell carcinoma of right lung ([place]) 09/08/2020   Encounter for antineoplastic chemotherapy 09/08/2020   Lung mass 08/20/2020   Perineal abscess 08/25/2017    Past Surgical History:  Procedure Laterality Date   APPENDECTOMY     BRONCHIAL BIOPSY  09/01/2020   Procedure: BRONCHIAL BIOPSIES;  Surgeon: Garner Nash, DO;  Location: [place];  Service: Pulmonary;;   BRONCHIAL BRUSHINGS  09/01/2020   Procedure: BRONCHIAL BRUSHINGS;  Surgeon: Garner Nash, DO;  Location: [place];  Service: Pulmonary;;   BRONCHIAL NEEDLE ASPIRATION BIOPSY  09/01/2020   Procedure:  BRONCHIAL NEEDLE ASPIRATION BIOPSIES;  Surgeon: Garner Nash, DO;  Location: [place] [facility];  Service: Pulmonary;;   BRONCHIAL WASHINGS  09/01/2020   Procedure: BRONCHIAL WASHINGS;  Surgeon: Garner Nash, DO;  Location: [place];  Service: Pulmonary;;   HERNIA REPAIR     INCISION AND DRAINAGE ABSCESS N/A 08/25/2017   Procedure: INCISION AND DRAINAGE perineal  ABSCESS;  Surgeon: Festus Aloe, MD;  Location: [facility];  Service: Urology;  Laterality: N/A;   VIDEO BRONCHOSCOPY WITH ENDOBRONCHIAL ULTRASOUND N/A 09/01/2020   Procedure: VIDEO BRONCHOSCOPY WITH ENDOBRONCHIAL ULTRASOUND;  Surgeon: Garner Nash, DO;  Location: [place];  Service: Pulmonary;  Laterality: N/A;       Family History  Problem Relation Age of Onset   Cancer Mother     Social History   Tobacco Use   Smoking status: Former    Packs/day: 2.00    Years: 30.00    Pack years: 60.00    Types: Cigarettes   Smokeless tobacco: Never  Vaping Use   Vaping Use: Never used  Substance Use Topics   Alcohol use: Yes    Alcohol/week: 1.0 - 2.0 standard drink    Types: 1 - 2 Cans of beer per week    Comment: few beers everyday   Drug use: Never    Home Medications Prior to Admission medications   Medication Sig Start Date End Date Taking? Authorizing Provider  albuterol (VENTOLIN HFA) 108 (90 Base) MCG/ACT inhaler Inhale 2 puffs into the lungs every 6 (six)  hours as needed for wheezing or shortness of breath. 02/12/21   [place] Eric J, DO  HYDROcodone bit-homatropine (HYCODAN) 5-1.5 MG/5ML syrup Take 5 mLs by mouth every 6 (six) hours as needed for cough. 02/08/21   Curt Bears, MD  levothyroxine (SYNTHROID) 112 MCG tablet Take 112 mcg by mouth in the morning. 06/22/20   [provider]  LORazepam (ATIVAN) 0.5 MG tablet Take 1 tablet (0.5 mg total) by mouth at bedtime. 10/20/20   Gery Pray, MD  prochlorperazine (COMPAZINE) 10 MG tablet TAKE 1 TABLET(10 MG) BY MOUTH EVERY 6 HOURS AS NEEDED FOR  NAUSEA OR VOMITING Patient taking differently: Take 10 mg by mouth every 6 (six) hours as needed for nausea or vomiting. 01/25/21   Heilingoetter, Cassandra L, PA-C  umeclidinium-vilanterol (ANORO ELLIPTA) 62.5-25 MCG/INH AEPB Inhale 1 puff into the lungs daily. 02/12/21 05/13/21  [place] Eric J, DO    Allergies    Patient has no known allergies.  Review of Systems   Review of Systems  Constitutional:  Negative for appetite change and fatigue.  HENT:  Negative for congestion, ear discharge and sinus pressure.   Eyes:  Negative for discharge.  Respiratory:  Positive for shortness of breath. Negative for cough.   Cardiovascular:  Negative for chest pain.  Gastrointestinal:  Negative for abdominal pain and diarrhea.  Genitourinary:  Negative for frequency and hematuria.  Musculoskeletal:  Negative for back pain.  Skin:  Negative for rash.  Neurological:  Negative for seizures and headaches.  Psychiatric/Behavioral:  Negative for hallucinations.    Physical Exam Updated Vital Signs BP 128/88   Pulse (!) 103   Temp 98.2 F (36.8 C) (Oral)   Resp (!) 24   Ht 5\' 8"  (1.727 m)   Wt 90.7 kg   SpO2 90%   BMI 30.41 kg/m   Physical Exam Vitals and nursing note reviewed.  Constitutional:      Appearance: He is well-developed.  HENT:     Head: Normocephalic.     Nose: Nose normal.  Eyes:     General: No scleral icterus.    Conjunctiva/sclera: Conjunctivae normal.  Neck:     Thyroid: No thyromegaly.  Cardiovascular:     Rate and Rhythm: Normal rate and regular rhythm.     Heart sounds: No murmur heard.   No friction rub. No gallop.  Pulmonary:     Breath sounds: No stridor. No wheezing or rales.  Chest:     Chest wall: No tenderness.  Abdominal:     General: There is no distension.     Tenderness: There is no abdominal tenderness. There is no rebound.  Musculoskeletal:        General: Normal range of motion.     Cervical back: Neck supple.  Lymphadenopathy:     Cervical: No  cervical adenopathy.  Skin:    Findings: No erythema or rash.  Neurological:     Mental Status: He is alert and oriented to person, place, and time.     Motor: No abnormal muscle tone.     Coordination: Coordination normal.  Psychiatric:        Behavior: Behavior normal.    ED Results / Procedures / Treatments   Labs (all labs ordered are listed, but only abnormal results are displayed) Labs Reviewed  COMPREHENSIVE METABOLIC PANEL - Abnormal; Notable for the following components:      Result Value   Sodium 132 (*)    Chloride 96 (*)    Glucose, Bld 108 (*)  Calcium 8.3 (*)    Albumin 2.8 (*)    All other components within normal limits  RESP PANEL BY RT-PCR (FLU A&B, COVID) ARPGX2  CBC WITH DIFFERENTIAL/PLATELET    EKG None  Radiology DG Chest Port 1 View  Result Date: 02/24/2021 CLINICAL DATA:  Shortness of breath and cough.  Hypoxemia. EXAM: PORTABLE CHEST 1 VIEW COMPARISON:  Radiographs 02/10/2021 and 09/01/2020. CT 02/10/2021 and 12/03/2020. FINDINGS: 1417 hours. There is similar volume loss in the right hemithorax with multifocal airspace opacities in the right lung. The left lung is clear. There is no pleural effusion or pneumothorax. The heart size and mediastinal contours are stable. The bones appear unremarkable. Telemetry leads overlie the chest. IMPRESSION: No significant change in multifocal right lung opacities with associated volume loss. In this patient with known right lung cancer and recent radiation therapy, these findings are nonspecific and may in part reflect radiation pneumonitis. Superimposed pneumonia difficult to completely exclude, although the left lung remains clear. Correlate clinically. Electronically Signed   By: Richardean Sale M.D.   On: 02/24/2021 14:51    Procedures Procedures   Medications Ordered in ED Medications  sodium chloride 0.9 % bolus 500 mL (has no administration in time range)  ipratropium-albuterol (DUONEB) 0.5-2.5 (3) MG/3ML  nebulizer solution 3 mL (3 mLs Nebulization Given 02/24/21 1456)  albuterol (PROVENTIL) (2.5 MG/3ML) 0.083% nebulizer solution 2.5 mg (2.5 mg Nebulization Given 02/24/21 1456)   After neb treatments patient still hypoxic with ambulates ED Course  I have reviewed the triage vital signs and the nursing notes.  Pertinent labs & imaging results that were available during my care of the patient were reviewed by me and considered in my medical decision making (see chart for details). Patient with hypoxia and lung cancer and recent pneumonia.  Patient was given albuterol treatments because of wheezing.  But he still continued to be hypoxic so he will be admitted   MDM Rules/Calculators/A&P                           Patient with lung cancer and bronchospasm and hypoxia.  Patient will be admitted to medicine Final Clinical Impression(s) / ED Diagnoses Final diagnoses:  Hypoxia    Rx / DC Orders ED Discharge Orders     None        Milton Ferguson, MD 02/27/21 [phone]

## 2021-02-24 NOTE — Progress Notes (Signed)
ANTICOAGULATION CONSULT NOTE  Pharmacy Consult for Heparin Indication: pulmonary embolus  No Known Allergies  Patient Measurements: Height: 5\' 8"  (172.7 cm) Weight: 90.7 kg (200 lb) IBW/kg (Calculated) : 68.4  Heparin Dosing Weight: 87.1 kg  Vital Signs: Temp: 98.2 F (36.8 C) (10/19 1500) Temp Source: Oral (10/19 1500) BP: 126/85 (10/19 1830) Pulse Rate: 94 (10/19 1830)  Labs: Recent Labs    02/24/21 1423  HGB 14.1  HCT 43.5  PLT 277  CREATININE 0.77    Estimated Creatinine Clearance: 108.7 mL/min (by C-G formula based on SCr of 0.77 mg/dL).   Medical History: Past Medical History:  Diagnosis Date   History of radiation therapy 11/12/2020   right lung  09/30/2020-11/12/2020  Dr Gery Pray   Hypothyroidism    Thyroid disease     Medications:  Scheduled:  Infusions:   sodium chloride 100 mL/hr at 02/24/21 1832   ceFEPime (MAXIPIME) IV     vancomycin      Assessment:  60 y.o. male who presented to the ED with complaints of difficulty breathing and cough of 3 days found to have multiple segmental and subsegmental pulmonary emboli in the left upper and lower lobes.   Patient was not on anticoagulation prior to admission. CBC stable, platelets are within normal limits.  Goal of Therapy:  Heparin level 0.3-0.7 units/ml Monitor platelets by anticoagulation protocol: Yes   Plan:  Give 6000 units bolus x 1 Start heparin infusion at 1500 units/hr Check heparin level in 6 hours and daily while on heparin Continue to monitor H&H and platelets   Thank you for allowing pharmacy to be a part of this patient's care.  Ardyth Harps, PharmD Clinical Pharmacist

## 2021-02-24 NOTE — ED Triage Notes (Signed)
Pt to the ED [facility] from [facility] where he had oxygen saturations of 78 on RA.  Pt has right lung CA and recently finished a course of antibiotics for pneumonia.  Pt was placed on 2L [place] and saturations rose to 91 percent.

## 2021-02-24 NOTE — Telephone Encounter (Signed)
Patient notified of completion of Disability Form. Fax Transmission Confirmation received. Copy mailed to Patient as requested.

## 2021-02-25 ENCOUNTER — Inpatient Hospital Stay (HOSPITAL_COMMUNITY): Payer: Managed Care, Other (non HMO)

## 2021-02-25 DIAGNOSIS — I2609 Other pulmonary embolism with acute cor pulmonale: Secondary | ICD-10-CM

## 2021-02-25 LAB — BASIC METABOLIC PANEL
Anion gap: 6 (ref 5–15)
BUN: 11 mg/dL (ref 6–20)
CO2: 25 mmol/L (ref 22–32)
Calcium: 8.1 mg/dL — ABNORMAL LOW (ref 8.9–10.3)
Chloride: 104 mmol/L (ref 98–111)
Creatinine, Ser: 0.62 mg/dL (ref 0.61–1.24)
GFR, Estimated: >60 mL/min (ref >60)
Glucose, Bld: 114 mg/dL — ABNORMAL HIGH (ref 70–99)
Potassium: 4.1 mmol/L (ref 3.5–5.1)
Sodium: 135 mmol/L (ref 135–145)

## 2021-02-25 LAB — CBC
HCT: 38.7 % — ABNORMAL LOW (ref 39.0–52.0)
Hemoglobin: 12.6 g/dL — ABNORMAL LOW (ref 13.0–17.0)
MCH: 28.8 pg (ref 26.0–34.0)
MCHC: 32.6 g/dL (ref 30.0–36.0)
MCV: 88.4 fL (ref 80.0–100.0)
Platelets: 258 10*3/uL (ref 150–400)
RBC: 4.38 MIL/uL (ref 4.22–5.81)
RDW: 12 % (ref 11.5–15.5)
WBC: 6.3 10*3/uL (ref 4.0–10.5)
nRBC: 0 % (ref 0.0–0.2)

## 2021-02-25 LAB — ECHOCARDIOGRAM COMPLETE
Area-P 1/2: 3.48 cm2
Height: 68 in
S' Lateral: 3 cm
Weight: 3086.4 oz

## 2021-02-25 LAB — HEPARIN LEVEL (UNFRACTIONATED)
Heparin Unfractionated: 0.19 IU/mL — ABNORMAL LOW (ref 0.30–0.70)
Heparin Unfractionated: 0.27 IU/mL — ABNORMAL LOW (ref 0.30–0.70)
Heparin Unfractionated: 0.31 IU/mL (ref 0.30–0.70)

## 2021-02-25 LAB — STREP PNEUMONIAE URINARY ANTIGEN: Strep Pneumo Urinary Antigen: NEGATIVE

## 2021-02-25 LAB — MRSA NEXT GEN BY PCR, NASAL: MRSA by PCR Next Gen: NOT DETECTED

## 2021-02-25 MED ORDER — HEPARIN BOLUS VIA INFUSION
2000.0000 [IU] | Freq: Once | INTRAVENOUS | Status: AC
  Administered 2021-02-25: 2000 [IU] via INTRAVENOUS
  Filled 2021-02-25: qty 2000

## 2021-02-25 MED ORDER — HYDROCODONE BIT-HOMATROP MBR 5-1.5 MG/5ML PO SOLN
5.0000 mL | Freq: Four times a day (QID) | ORAL | Status: DC | PRN
  Administered 2021-02-25 – 2021-02-26 (×3): 5 mL via ORAL
  Filled 2021-02-25 (×3): qty 5

## 2021-02-25 MED ORDER — LORAZEPAM 0.5 MG PO TABS
0.5000 mg | ORAL_TABLET | Freq: Every day | ORAL | Status: DC
  Administered 2021-02-25 – 2021-02-27 (×3): 0.5 mg via ORAL
  Filled 2021-02-25 (×3): qty 1

## 2021-02-25 MED ORDER — ASCORBIC ACID 500 MG PO TABS
500.0000 mg | ORAL_TABLET | Freq: Every day | ORAL | Status: DC
  Administered 2021-02-25 – 2021-02-28 (×4): 500 mg via ORAL
  Filled 2021-02-25 (×4): qty 1

## 2021-02-25 NOTE — Progress Notes (Signed)
ANTICOAGULATION CONSULT NOTE  Pharmacy Consult for Heparin Indication: pulmonary embolus  No Known Allergies  Patient Measurements: Height: 5\' 8"  (172.7 cm) Weight: 87.5 kg (192 lb 14.4 oz) IBW/kg (Calculated) : 68.4  Heparin Dosing Weight: 87.1 kg  Vital Signs: Temp: 98 F (36.7 C) (10/20 0354) Temp Source: Oral (10/20 0354) BP: 128/80 (10/20 0354) Pulse Rate: 79 (10/20 0354)  Labs: Recent Labs    02/24/21 1423 02/25/21 0317 02/25/21 1149  HGB 14.1 12.6*  --   HCT 43.5 38.7*  --   PLT 277 258  --   HEPARINUNFRC  --  0.19* 0.27*  CREATININE 0.77 0.62  --      Estimated Creatinine Clearance: 106.9 mL/min (by C-G formula based on SCr of 0.62 mg/dL).   Medical History: Past Medical History:  Diagnosis Date   History of radiation therapy 11/12/2020   right lung  09/30/2020-11/12/2020  Dr Gery Pray   Hypothyroidism    Thyroid disease     Medications:  Scheduled:   vitamin C  500 mg Oral Daily   feeding supplement  237 mL Oral BID BM   levothyroxine  112 mcg Oral q AM   LORazepam  0.5 mg Oral QHS   Infusions:   ceFEPime (MAXIPIME) IV 2 g (02/25/21 0546)   heparin 1,700 Units/hr (02/25/21 0516)   vancomycin 1,000 mg (02/25/21 8337)    Assessment:  60 y.o. male who presented to the ED with complaints of difficulty breathing and cough of 3 days found to have multiple segmental and subsegmental pulmonary emboli in the left upper and lower lobes.   Patient was not on anticoagulation prior to admission. CBC stable, platelets are within normal limits.  HL 0.27- slightly subtherapeutic    Goal of Therapy:  Heparin level 0.3-0.7 units/ml Monitor platelets by anticoagulation protocol: Yes   Plan:  Heparin 2000 units re-bolus Inc heparin to 1900 units/hr Check heparin level in 6-8 hours and daily while on heparin Continue to monitor H&H and platelets  Margot Ables, PharmD Clinical Pharmacist 02/25/2021 12:25 PM

## 2021-02-25 NOTE — Progress Notes (Signed)
Initial Nutrition Assessment  DOCUMENTATION CODES:   Not applicable  INTERVENTION:  Regular diet  Ensure Enlive po BID, each supplement provides 350 kcal and 20 grams of protein   NUTRITION DIAGNOSIS:   Inadequate oral intake related to acute illness (severe sepsis/HCAP) as evidenced by per patient/family report (2 meals daily).   GOAL:  Patient will meet greater than or equal to 90% of their needs  MONITOR:  PO intake, Supplement acceptance, Labs, Weight trends  REASON FOR ASSESSMENT:   Malnutrition Screening Tool   ASSESSMENT: Patient has hx of lung cancer (stage 3 squamous cell carcinoma of right lung). He presents with acute respiratory failure with hypoxia, PE, severe sepsis  (HCAP) and hypothyroidism.   Patient is receiving a regular diet and ate 100% of breakfast and ~ 50% of lunch today. Able to feed himself. He follows a regular diet at home. Lives with his wife who shops for their food and prepares meals. Usually eats 2 meals daily and snacks ad lib. He drinks ONS daily -vanilla flavor.   Medications reviewed and include: Ascorbic acid, Ensure BID, synthroid and IV vancomycin.   Patient usual weight is 90-92 kg. Currently 87.5 kg  a loss of 3.6% x < 2 weeks.   Labs: BMP Latest Ref Rng & Units 02/25/2021 02/24/2021 02/12/2021  Glucose 70 - 99 mg/dL 114(H) 108(H) 100(H)  BUN 6 - 20 mg/dL 11 15 11   Creatinine 0.61 - 1.24 mg/dL 0.62 0.77 0.74  Sodium 135 - 145 mmol/L 135 132(L) 136  Potassium 3.5 - 5.1 mmol/L 4.1 4.0 4.0  Chloride 98 - 111 mmol/L 104 96(L) 101  CO2 22 - 32 mmol/L 25 27 29   Calcium 8.9 - 10.3 mg/dL 8.1(L) 8.3(L) 8.5(L)      NUTRITION - FOCUSED PHYSICAL EXAM: Nutrition-Focused physical exam completed. Findings are no fat depletion, no muscle depletion, and no edema.     Diet Order:   Diet Order             Diet regular Room service appropriate? Yes; Fluid consistency: Thin  Diet effective now                   EDUCATION NEEDS:   Education needs have been addressed  Skin:  Skin Assessment: Reviewed RN Assessment  Last BM:  10/18  Height:   Ht Readings from Last 1 Encounters:  02/24/21 5\' 8"  (1.727 m)    Weight:   Wt Readings from Last 1 Encounters:  02/24/21 87.5 kg    Ideal Body Weight:   70 kg  BMI:  Body mass index is 29.33 kg/m.  Estimated Nutritional Needs:   Kcal:  2100-2300  Protein:  105- 112 gr  Fluid:  > 2 liters daily   Colman Cater MS,RD,CSG,LDN Contact: Shea Evans

## 2021-02-25 NOTE — Progress Notes (Signed)
PROGRESS NOTE    Paul Mclean  HYQ:[id number] DOB: 26-Dec-1960 DOA: 02/24/2021 PCP: [name], MD     Brief Narrative:  Paul Mclean is a 60 y.o. male with medical history significant for lung cancer.  Patient presented to the ED with complaints of difficulty breathing and cough of 3 days duration. Patient was recently hospitalized 10/5-10/7 for acute respiratory failure secondary to pneumonia.  He completed 5 days of azithromycin and 7 days of cephalosporins (ceftriaxone>>cefdinir on discharge).  Patient states that initially his symptoms improved but then worsened over the past 2 days.  Admits to nonproductive cough and shortness of breath.  Work-up revealed right-sided worsening infiltrates and left-sided PE.  Patient was started on IV vancomycin, cefepime, IV heparin.  New events last 24 hours / Subjective: States that he is feeling about the same.  Still having nonproductive coughing fits.  Remains on 3.5 L nasal cannula O2 this morning, does not wear oxygen at baseline.  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  Continue to wean as able  PE with underlying lung cancer -CTA chest shows multiple segmental and subsegmental, PE in left upper and lower lobes.  No definite evidence of right heart strain. -Remains on IV heparin -Pending echocardiogram, DVT ultrasound  Severe sepsis with HCAP -Sepsis present on admission -Check MRSA PCR, can discontinue vancomycin if negative -Continue IV cefepime  Squamous cell carcinoma of right lung -Follows with Dr. Earlie Server, completed 8 cycles of chemotherapy, currently on immunotherapy  Hypothyroidism -Continue Synthroid  DVT prophylaxis: IV  heparin Code Status: Full code Family Communication: No family at bedside Disposition Plan:  Status is: Inpatient  Remains inpatient appropriate because: Remains on IV antibiotic, IV heparin, oxygen      Consultants:  None  Procedures:  None  Antimicrobials:  Anti-infectives (From admission, onward)    Start     Dose/Rate Route Frequency Ordered Stop   02/25/21 0800  vancomycin (VANCOCIN) IVPB 1000 mg/200 mL premix        1,000 mg 200 mL/hr over 60 Minutes Intravenous Every 12 hours 02/24/21 2017     02/24/21 2200  ceFEPIme (MAXIPIME) 2 g in sodium chloride 0.9 % 100 mL IVPB        2 g 200 mL/hr over 30 Minutes Intravenous Every 8 hours 02/24/21 1907     02/24/21 2000  vancomycin (VANCOREADY) IVPB 1750 mg/350 mL        1,750 mg 175 mL/hr over 120 Minutes Intravenous  Once 02/24/21 1918 02/24/21 2331        Objective: Vitals:   02/24/21 2001 02/24/21 2028 02/25/21 0000 02/25/21 0354  BP: 135/88  122/85 128/80  Pulse: 97  75 79  Resp: (!) 21  (!) 22 (!) 22  Temp: 98.1 F (36.7 C)  98.2 F (36.8 C) 98 F (36.7 C)  TempSrc: Oral  Oral Oral  SpO2: 93%  96% 94%  Weight:  87.5 kg    Height:  5\' 8"  (1.727 m)      Intake/Output Summary (Last 24 hours) at 02/25/2021 1125 Last data filed at 02/25/2021 0927 Gross per 24 hour  Intake 1615.46 ml  Output --  Net 1615.46 ml  Filed Weights   02/24/21 1330 02/24/21 2028  Weight: 90.7 kg 87.5 kg    Examination:  General exam: Appears calm, coughing fits throughout exam Respiratory system: Difficult to auscultate due to coughing fits, no respiratory distress Cardiovascular system: S1 & S2 heard, RRR. No murmurs. No pedal edema. Gastrointestinal system: Abdomen is nondistended, soft and nontender. Normal bowel sounds heard. Central nervous system: Alert and oriented. No focal neurological deficits. Speech clear.  Extremities: Symmetric in appearance, no pain in bilateral calves, no swelling Skin: No rashes, lesions  or ulcers on exposed skin  Psychiatry: Judgement and insight appear normal. Mood & affect appropriate.   Data Reviewed: I have personally reviewed following labs and imaging studies  CBC: Recent Labs  Lab 02/24/21 1423 02/25/21 0317  WBC 7.3 6.3  NEUTROABS 5.1  --   HGB 14.1 12.6*  HCT 43.5 38.7*  MCV 88.4 88.4  PLT 277 295   Basic Metabolic Panel: Recent Labs  Lab 02/24/21 1423 02/25/21 0317  NA 132* 135  K 4.0 4.1  CL 96* 104  CO2 27 25  GLUCOSE 108* 114*  BUN 15 11  CREATININE 0.77 0.62  CALCIUM 8.3* 8.1*   GFR: Estimated Creatinine Clearance: 106.9 mL/min (by C-G formula based on SCr of 0.62 mg/dL). Liver Function Tests: Recent Labs  Lab 02/24/21 1423  AST 25  ALT 25  ALKPHOS 72  BILITOT 0.6  PROT 7.9  ALBUMIN 2.8*   No results for input(s): LIPASE, AMYLASE in the last 168 hours. No results for input(s): AMMONIA in the last 168 hours. Coagulation Profile: No results for input(s): INR, PROTIME in the last 168 hours. Cardiac Enzymes: No results for input(s): CKTOTAL, CKMB, CKMBINDEX, TROPONINI in the last 168 hours. BNP (last 3 results) No results for input(s): PROBNP in the last 8760 hours. HbA1C: No results for input(s): HGBA1C in the last 72 hours. CBG: No results for input(s): GLUCAP in the last 168 hours. Lipid Profile: No results for input(s): CHOL, HDL, LDLCALC, TRIG, CHOLHDL, LDLDIRECT in the last 72 hours. Thyroid Function Tests: No results for input(s): TSH, T4TOTAL, FREET4, T3FREE, THYROIDAB in the last 72 hours. Anemia Panel: No results for input(s): VITAMINB12, FOLATE, FERRITIN, TIBC, IRON, RETICCTPCT in the last 72 hours. Sepsis Labs: Recent Labs  Lab 02/24/21 1423  LATICACIDVEN 1.8    Recent Results (from the past 240 hour(s))  Resp Panel by RT-PCR (Flu A&B, Covid) Nasopharyngeal Swab     Status: None   Collection Time: 02/24/21  2:45 PM   Specimen: Nasopharyngeal Swab; Nasopharyngeal(NP) swabs in vial transport medium  Result  Value Ref Range Status   SARS Coronavirus 2 by RT PCR NEGATIVE NEGATIVE Final    Comment: (NOTE) SARS-CoV-2 target nucleic acids are NOT DETECTED.  The SARS-CoV-2 RNA is generally detectable in upper respiratory specimens during the acute phase of infection. The lowest concentration of SARS-CoV-2 viral copies this assay can detect is 138 copies/mL. A negative result does not preclude SARS-Cov-2 infection and should not be used as the sole basis for treatment or other patient management decisions. A negative result may occur with  improper specimen collection/handling, submission of specimen other than nasopharyngeal swab, presence of viral mutation(s) within the areas targeted by this assay, and inadequate number of viral copies(<138 copies/mL). A negative result must be combined with clinical observations, patient history, and epidemiological information. The expected result is Negative.  Fact Sheet for Patients:  EntrepreneurPulse.com.au  Fact Sheet for Healthcare Providers:  IncredibleEmployment.be  This test is no t  yet approved or cleared by the [place] and  has been authorized for detection and/or diagnosis of SARS-CoV-2 by FDA under an Emergency Use Authorization (EUA). This EUA will remain  in effect (meaning this test can be used) for the duration of the COVID-19 declaration under Section 564(b)(1) of the Act, 21 U.S.C.section 360bbb-3(b)(1), unless the authorization is terminated  or revoked sooner.       Influenza A by PCR NEGATIVE NEGATIVE Final   Influenza B by PCR NEGATIVE NEGATIVE Final    Comment: (NOTE) The Xpert Xpress SARS-CoV-2/FLU/RSV plus assay is intended as an aid in the diagnosis of influenza from Nasopharyngeal swab specimens and should not be used as a sole basis for treatment. Nasal washings and aspirates are unacceptable for Xpert Xpress SARS-CoV-2/FLU/RSV testing.  Fact Sheet for  Patients: EntrepreneurPulse.com.au  Fact Sheet for Healthcare Providers: IncredibleEmployment.be  This test is not yet approved or cleared by the [place] FDA and has been authorized for detection and/or diagnosis of SARS-CoV-2 by FDA under an Emergency Use Authorization (EUA). This EUA will remain in effect (meaning this test can be used) for the duration of the COVID-19 declaration under Section 564(b)(1) of the Act, 21 U.S.C. section 360bbb-3(b)(1), unless the authorization is terminated or revoked.  Performed at [facility], [address], [place], [place] [address]       Radiology Studies: CT Angio Chest Pulmonary Embolism (PE) W or WO Contrast  Result Date: 02/24/2021 CLINICAL DATA:  Acute hypoxic respiratory failure, recent PNA, lung cancer. EXAM: CT ANGIOGRAPHY CHEST WITH CONTRAST TECHNIQUE: Multidetector CT imaging of the chest was performed using the standard protocol during bolus administration of intravenous contrast. Multiplanar CT image reconstructions and MIPs were obtained to evaluate the vascular anatomy. CONTRAST:  136mL OMNIPAQUE IOHEXOL 350 MG/ML SOLN COMPARISON:  CT 02/10/2021 FINDINGS: Cardiovascular: Normal cardiac size.No pericardial disease.There are multiple segmental and subsegmental pulmonary emboli in the left upper and lower lobes.RV: LV ratio measures 1.0. Mediastinum/Nodes: There is right hilar lymphadenopathy.The thyroid is unremarkable.Esophagus is unremarkable. Lungs/Pleura: Worsening consolidation and ground-glass in the right apex and right middle lobe. There is new dense consolidation in the medial right lower lobe, with areas of improved aeration in the right lung base and periphery of the right lower lobe.Trace right pleural effusion.No pneumothoraxknown right hilar mass measures 3.8 x 1.8 cm, previously 4.3 x 2.1 cm, remeasured for consistency (series 4, image 47). Upper Abdomen: No acute abnormality.  Musculoskeletal: No acute osseous abnormality.No suspicious lytic or blastic lesions. Review of the MIP images confirms the above findings. IMPRESSION: Multiple segmental and subsegmental pulmonary emboli in the left upper and lower lobes. No definite CT evidence of right heart strain. Worsening consolidation and ground-glass in the right upper lobe and right middle lobe with new dense consolidation in the right lower lobe. Findings are concerning for ongoing/worsening multifocal pneumonia, although there is an area of improvement in the periphery of the right lower lobe and superior segment of the right lower lobe. Critical Value/emergent results were called by telephone at the time of interpretation on 02/24/2021 at 5:33 pm to provider [employer] , who verbally acknowledged these results. Electronically Signed   By: Maurine Simmering M.D.   On: 02/24/2021 17:33   DG Chest Port 1 View  Result Date: 02/24/2021 CLINICAL DATA:  Shortness of breath and cough.  Hypoxemia. EXAM: PORTABLE CHEST 1 VIEW COMPARISON:  Radiographs 02/10/2021 and 09/01/2020. CT 02/10/2021 and 12/03/2020. FINDINGS: 1417 hours. There is similar volume loss in the right  hemithorax with multifocal airspace opacities in the right lung. The left lung is clear. There is no pleural effusion or pneumothorax. The heart size and mediastinal contours are stable. The bones appear unremarkable. Telemetry leads overlie the chest. IMPRESSION: No significant change in multifocal right lung opacities with associated volume loss. In this patient with known right lung cancer and recent radiation therapy, these findings are nonspecific and may in part reflect radiation pneumonitis. Superimposed pneumonia difficult to completely exclude, although the left lung remains clear. Correlate clinically. Electronically Signed   By: Richardean Sale M.D.   On: 02/24/2021 14:51      Scheduled Meds:  feeding supplement  237 mL Oral BID BM   levothyroxine  112 mcg  Oral q AM   Continuous Infusions:  ceFEPime (MAXIPIME) IV 2 g (02/25/21 0546)   heparin 1,700 Units/hr (02/25/21 0516)   vancomycin 1,000 mg (02/25/21 0832)     LOS: 1 day      Time spent: 35 minutes   Dessa Phi, DO [facility] 02/25/2021, 11:25 AM   Available via Epic secure chat 7am-7pm After these hours, please refer to coverage provider listed on [email or web address]

## 2021-02-25 NOTE — Progress Notes (Signed)
ANTICOAGULATION CONSULT NOTE  Pharmacy Consult for Heparin Indication: pulmonary embolus  No Known Allergies  Patient Measurements: Height: 5\' 8"  (172.7 cm) Weight: 87.5 kg (192 lb 14.4 oz) IBW/kg (Calculated) : 68.4  Heparin Dosing Weight: 87.1 kg  Vital Signs: Temp: 98.2 F (36.8 C) (10/20 1413) Temp Source: Oral (10/20 1413) Pulse Rate: 85 (10/20 1413)  Labs: Recent Labs    02/24/21 1423 02/25/21 0317 02/25/21 1149 02/25/21 1900  HGB 14.1 12.6*  --   --   HCT 43.5 38.7*  --   --   PLT 277 258  --   --   HEPARINUNFRC  --  0.19* 0.27* 0.31  CREATININE 0.77 0.62  --   --      Estimated Creatinine Clearance: 106.9 mL/min (by C-G formula based on SCr of 0.62 mg/dL).   Assessment:  60 y.o. male who presented to the ED with complaints of difficulty breathing and cough of 3 days found to have multiple segmental and subsegmental pulmonary emboli in the left upper and lower lobes.   Patient was not on anticoagulation prior to admission. CBC stable, platelets are within normal limits.  Heparin level 0.31 this PM   Goal of Therapy:  Heparin level 0.3-0.7 units/ml Monitor platelets by anticoagulation protocol: Yes   Plan:  Increase heparin to 2050 units / hr Follow up AM heparin level Continue to monitor H&H and platelets  Thank you Anette Guarneri, PharmD 02/25/2021 7:33 PM

## 2021-02-25 NOTE — Progress Notes (Signed)
*  PRELIMINARY RESULTS* Echocardiogram 2D Echocardiogram has been performed.  Paul Mclean 02/25/2021, 12:22 PM

## 2021-02-25 NOTE — Progress Notes (Signed)
Informed patient I needed a sputum and urine sample to take to lab. He stated that he did not have a productive cough and that he would urinate when he got up.

## 2021-02-25 NOTE — Progress Notes (Signed)
ANTICOAGULATION CONSULT NOTE  Pharmacy Consult for Heparin Indication: pulmonary embolus  No Known Allergies  Patient Measurements: Height: 5\' 8"  (172.7 cm) Weight: 87.5 kg (192 lb 14.4 oz) IBW/kg (Calculated) : 68.4  Heparin Dosing Weight: 87.1 kg  Vital Signs: Temp: 98 F (36.7 C) (10/20 0354) Temp Source: Oral (10/20 0354) BP: 128/80 (10/20 0354) Pulse Rate: 79 (10/20 0354)  Labs: Recent Labs    02/24/21 1423 02/25/21 0317  HGB 14.1 12.6*  HCT 43.5 38.7*  PLT 277 258  HEPARINUNFRC  --  0.19*  CREATININE 0.77 0.62     Estimated Creatinine Clearance: 106.9 mL/min (by C-G formula based on SCr of 0.62 mg/dL).   Medical History: Past Medical History:  Diagnosis Date   History of radiation therapy 11/12/2020   right lung  09/30/2020-11/12/2020  Dr Gery Pray   Hypothyroidism    Thyroid disease     Medications:  Scheduled:   feeding supplement  237 mL Oral BID BM   levothyroxine  112 mcg Oral q AM   Infusions:   sodium chloride 100 mL/hr at 02/25/21 0300   ceFEPime (MAXIPIME) IV 2 g (02/24/21 2344)   heparin 1,500 Units/hr (02/24/21 2054)   vancomycin      Assessment:  60 y.o. male who presented to the ED with complaints of difficulty breathing and cough of 3 days found to have multiple segmental and subsegmental pulmonary emboli in the left upper and lower lobes.   Patient was not on anticoagulation prior to admission. CBC stable, platelets are within normal limits.  10/20 AM update:  Heparin level low   Goal of Therapy:  Heparin level 0.3-0.7 units/ml Monitor platelets by anticoagulation protocol: Yes   Plan:  Heparin 2000 units re[employer] heparin to 1700 units/hr Check heparin level in 6-8 hours and daily while on heparin Continue to monitor H&H and platelets   Narda Bonds, PharmD, [place] Pharmacist Phone: [phone]

## 2021-02-26 LAB — CBC
HCT: 37.4 % — ABNORMAL LOW (ref 39.0–52.0)
Hemoglobin: 12.4 g/dL — ABNORMAL LOW (ref 13.0–17.0)
MCH: 29 pg (ref 26.0–34.0)
MCHC: 33.2 g/dL (ref 30.0–36.0)
MCV: 87.4 fL (ref 80.0–100.0)
Platelets: 243 10*3/uL (ref 150–400)
RBC: 4.28 MIL/uL (ref 4.22–5.81)
RDW: 12 % (ref 11.5–15.5)
WBC: 5.1 10*3/uL (ref 4.0–10.5)
nRBC: 0 % (ref 0.0–0.2)

## 2021-02-26 LAB — LEGIONELLA PNEUMOPHILA SEROGP 1 UR AG: L. pneumophila Serogp 1 Ur Ag: NEGATIVE

## 2021-02-26 LAB — HEPARIN LEVEL (UNFRACTIONATED): Heparin Unfractionated: 0.37 IU/mL (ref 0.30–0.70)

## 2021-02-26 MED ORDER — RIVAROXABAN 15 MG PO TABS
15.0000 mg | ORAL_TABLET | Freq: Two times a day (BID) | ORAL | Status: DC
Start: 2021-02-26 — End: 2021-02-28
  Administered 2021-02-26 – 2021-02-28 (×5): 15 mg via ORAL
  Filled 2021-02-26 (×5): qty 1

## 2021-02-26 MED ORDER — RIVAROXABAN 20 MG PO TABS: 20.0000 mg | ORAL_TABLET | Freq: Every day | ORAL | Status: DC

## 2021-02-26 NOTE — Progress Notes (Signed)
Tried weaning patient from oxygen today, was on 3L this morning. We got down to 1L and sat were staying around 92 however patient's respirations went up to 24/min. Messaged Dr.Choi. Per MD oxygen bumped back up to 2-2.5L for today. May try to wean again tomorrow.

## 2021-02-26 NOTE — Progress Notes (Signed)
PROGRESS NOTE    Paul Mclean  OQH:[id number] DOB: 10-24-1960 DOA: 02/24/2021 PCP: [name], MD     Brief Narrative:  Paul Mclean is a 60 y.o. male with medical history significant for lung cancer.  Patient presented to the ED with complaints of difficulty breathing and cough of 3 days duration. Patient was recently hospitalized 10/5-10/7 for acute respiratory failure secondary to pneumonia.  He completed 5 days of azithromycin and 7 days of cephalosporins (ceftriaxone>>cefdinir on discharge).  Patient states that initially his symptoms improved but then worsened over the past 2 days.  Admits to nonproductive cough and shortness of breath.  Work-up revealed right-sided worsening infiltrates and left-sided PE.  Patient was started on IV vancomycin, cefepime, IV heparin.  Patient underwent echocardiogram as well as venous Doppler ultrasound which revealed left-sided DVT.  New events last 24 hours / Subjective: Patient states that his breathing has improved, he has been weaned down to 2.5 L nasal cannula oxygen today.  Still having dry coughing spells.  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  Continue to wean as able.  He does not wear oxygen at baseline  PE and left DVT with underlying lung cancer -CTA chest shows multiple segmental and subsegmental, PE in left upper and lower lobes.  No definite evidence of right heart strain. -Echocardiogram without evidence of heart strain -Venous Doppler ultrasound revealed left lower extremity DVT -IV heparin transition to Xarelto  Severe sepsis with HCAP -Sepsis present on admission -MRSA PCR negative, vancomycin discontinued -Continue IV  cefepime  Squamous cell carcinoma of right lung -Follows with Dr. Earlie Server, completed 8 cycles of chemotherapy, currently on immunotherapy  Hypothyroidism -Continue Synthroid  DVT prophylaxis: IV heparin --> Xarelto Code Status: Full code Family Communication: No family at bedside, updated fianc over the phone Disposition Plan:  Status is: Inpatient  Remains inpatient appropriate because: Remains on IV antibiotic, oxygen      Consultants:  None  Procedures:  None  Antimicrobials:  Anti-infectives (From admission, onward)    Start     Dose/Rate Route Frequency Ordered Stop   02/25/21 0800  vancomycin (VANCOCIN) IVPB 1000 mg/200 mL premix  Status:  Discontinued        1,000 mg 200 mL/hr over 60 Minutes Intravenous Every 12 hours 02/24/21 2017 02/25/21 1256   02/24/21 2200  ceFEPIme (MAXIPIME) 2 g in sodium chloride 0.9 % 100 mL IVPB        2 g 200 mL/hr over 30 Minutes Intravenous Every 8 hours 02/24/21 1907     02/24/21 2000  vancomycin (VANCOREADY) IVPB 1750 mg/350 mL        1,750 mg 175 mL/hr over 120 Minutes Intravenous  Once 02/24/21 1918 02/24/21 2331        Objective: Vitals:   02/25/21 0354 02/25/21 1413 02/25/21 2112 02/26/21 0457  BP: 128/80  131/84 (!) 139/93  Pulse: 79 85 83 88  Resp: (!) 22 20 19 18   Temp: 98 F (36.7 C) 98.2 F (36.8 C) 98.2 F (36.8 C) 98.2 F (36.8 C)  TempSrc: Oral Oral Oral Oral  SpO2: 94% 96% 94% 95%  Weight:      Height:  Intake/Output Summary (Last 24 hours) at 02/26/2021 1058 Last data filed at 02/25/2021 1725 Gross per 24 hour  Intake 1087.18 ml  Output 300 ml  Net 787.18 ml    Filed Weights   02/24/21 1330 02/24/21 2028  Weight: 90.7 kg 87.5 kg    Examination:  General exam: Appears calm, coughing fits throughout exam Respiratory system: Difficult to auscultate due to coughing fits, no respiratory distress, on 2.5L O2 Cardiovascular system: S1 & S2 heard, RRR. No murmurs. No pedal  edema. Gastrointestinal system: Abdomen is nondistended, soft and nontender. Normal bowel sounds heard. Central nervous system: Alert and oriented. No focal neurological deficits. Speech clear.  Extremities: Symmetric in appearance, no pain in bilateral calves, no swelling Skin: No rashes, lesions or ulcers on exposed skin  Psychiatry: Judgement and insight appear normal. Mood & affect appropriate.   Data Reviewed: I have personally reviewed following labs and imaging studies  CBC: Recent Labs  Lab 02/24/21 1423 02/25/21 0317 02/26/21 0608  WBC 7.3 6.3 5.1  NEUTROABS 5.1  --   --   HGB 14.1 12.6* 12.4*  HCT 43.5 38.7* 37.4*  MCV 88.4 88.4 87.4  PLT 277 258 509    Basic Metabolic Panel: Recent Labs  Lab 02/24/21 1423 02/25/21 0317  NA 132* 135  K 4.0 4.1  CL 96* 104  CO2 27 25  GLUCOSE 108* 114*  BUN 15 11  CREATININE 0.77 0.62  CALCIUM 8.3* 8.1*    GFR: Estimated Creatinine Clearance: 106.9 mL/min (by C-G formula based on SCr of 0.62 mg/dL). Liver Function Tests: Recent Labs  Lab 02/24/21 1423  AST 25  ALT 25  ALKPHOS 72  BILITOT 0.6  PROT 7.9  ALBUMIN 2.8*    No results for input(s): LIPASE, AMYLASE in the last 168 hours. No results for input(s): AMMONIA in the last 168 hours. Coagulation Profile: No results for input(s): INR, PROTIME in the last 168 hours. Cardiac Enzymes: No results for input(s): CKTOTAL, CKMB, CKMBINDEX, TROPONINI in the last 168 hours. BNP (last 3 results) No results for input(s): PROBNP in the last 8760 hours. HbA1C: No results for input(s): HGBA1C in the last 72 hours. CBG: No results for input(s): GLUCAP in the last 168 hours. Lipid Profile: No results for input(s): CHOL, HDL, LDLCALC, TRIG, CHOLHDL, LDLDIRECT in the last 72 hours. Thyroid Function Tests: No results for input(s): TSH, T4TOTAL, FREET4, T3FREE, THYROIDAB in the last 72 hours. Anemia Panel: No results for input(s): VITAMINB12, FOLATE, FERRITIN, TIBC, IRON,  RETICCTPCT in the last 72 hours. Sepsis Labs: Recent Labs  Lab 02/24/21 1423  LATICACIDVEN 1.8     Recent Results (from the past 240 hour(s))  Resp Panel by RT-PCR (Flu A&B, Covid) Nasopharyngeal Swab     Status: None   Collection Time: 02/24/21  2:45 PM   Specimen: Nasopharyngeal Swab; Nasopharyngeal(NP) swabs in vial transport medium  Result Value Ref Range Status   SARS Coronavirus 2 by RT PCR NEGATIVE NEGATIVE Final    Comment: (NOTE) SARS-CoV-2 target nucleic acids are NOT DETECTED.  The SARS-CoV-2 RNA is generally detectable in upper respiratory specimens during the acute phase of infection. The lowest concentration of SARS-CoV-2 viral copies this assay can detect is 138 copies/mL. A negative result does not preclude SARS-Cov-2 infection and should not be used as the sole basis for treatment or other patient management decisions. A negative result may occur with  improper specimen collection/handling, submission of specimen other than nasopharyngeal swab, presence of viral mutation(s) within the areas targeted  by this assay, and inadequate number of viral copies(<138 copies/mL). A negative result must be combined with clinical observations, patient history, and epidemiological information. The expected result is Negative.  Fact Sheet for Patients:  EntrepreneurPulse.com.au  Fact Sheet for Healthcare Providers:  IncredibleEmployment.be  This test is no t yet approved or cleared by the [place] FDA and  has been authorized for detection and/or diagnosis of SARS-CoV-2 by FDA under an Emergency Use Authorization (EUA). This EUA will remain  in effect (meaning this test can be used) for the duration of the COVID-19 declaration under Section 564(b)(1) of the Act, 21 U.S.C.section 360bbb-3(b)(1), unless the authorization is terminated  or revoked sooner.       Influenza A by PCR NEGATIVE NEGATIVE Final   Influenza B by PCR  NEGATIVE NEGATIVE Final    Comment: (NOTE) The Xpert Xpress SARS-CoV-2/FLU/RSV plus assay is intended as an aid in the diagnosis of influenza from Nasopharyngeal swab specimens and should not be used as a sole basis for treatment. Nasal washings and aspirates are unacceptable for Xpert Xpress SARS-CoV-2/FLU/RSV testing.  Fact Sheet for Patients: EntrepreneurPulse.com.au  Fact Sheet for Healthcare Providers: IncredibleEmployment.be  This test is not yet approved or cleared by the [place] FDA and has been authorized for detection and/or diagnosis of SARS-CoV-2 by FDA under an Emergency Use Authorization (EUA). This EUA will remain in effect (meaning this test can be used) for the duration of the COVID-19 declaration under Section 564(b)(1) of the Act, 21 U.S.C. section 360bbb-3(b)(1), unless the authorization is terminated or revoked.  Performed at [facility], [address], [place], [place] [address]   MRSA Next Gen by PCR, Nasal     Status: None   Collection Time: 02/25/21  7:30 AM   Specimen: Nasal Mucosa; Nasal Swab  Result Value Ref Range Status   MRSA by PCR Next Gen NOT DETECTED NOT DETECTED Final    Comment: (NOTE) The GeneXpert MRSA Assay (FDA approved for NASAL specimens only), is one component of a comprehensive MRSA colonization surveillance program. It is not intended to diagnose MRSA infection nor to guide or monitor treatment for MRSA infections. Test performance is not FDA approved in patients less than 72 years old. Performed at [facility], [address] [place],  [address]        Radiology Studies: CT Angio Chest Pulmonary Embolism (PE) W or WO Contrast  Result Date: 02/24/2021 CLINICAL DATA:  Acute hypoxic respiratory failure, recent PNA, lung cancer. EXAM: CT ANGIOGRAPHY CHEST WITH CONTRAST TECHNIQUE: Multidetector CT imaging of the chest was performed using the standard protocol during bolus  administration of intravenous contrast. Multiplanar CT image reconstructions and MIPs were obtained to evaluate the vascular anatomy. CONTRAST:  153mL OMNIPAQUE IOHEXOL 350 MG/ML SOLN COMPARISON:  CT 02/10/2021 FINDINGS: Cardiovascular: Normal cardiac size.No pericardial disease.There are multiple segmental and subsegmental pulmonary emboli in the left upper and lower lobes.RV: LV ratio measures 1.0. Mediastinum/Nodes: There is right hilar lymphadenopathy.The thyroid is unremarkable.Esophagus is unremarkable. Lungs/Pleura: Worsening consolidation and ground-glass in the right apex and right middle lobe. There is new dense consolidation in the medial right lower lobe, with areas of improved aeration in the right lung base and periphery of the right lower lobe.Trace right pleural effusion.No pneumothoraxknown right hilar mass measures 3.8 x 1.8 cm, previously 4.3 x 2.1 cm, remeasured for consistency (series 4, image 47). Upper Abdomen: No acute abnormality. Musculoskeletal: No acute osseous abnormality.No suspicious lytic or blastic lesions. Review of the MIP images confirms the  above findings. IMPRESSION: Multiple segmental and subsegmental pulmonary emboli in the left upper and lower lobes. No definite CT evidence of right heart strain. Worsening consolidation and ground-glass in the right upper lobe and right middle lobe with new dense consolidation in the right lower lobe. Findings are concerning for ongoing/worsening multifocal pneumonia, although there is an area of improvement in the periphery of the right lower lobe and superior segment of the right lower lobe. Critical Value/emergent results were called by telephone at the time of interpretation on 02/24/2021 at 5:33 pm to provider [facility] , who verbally acknowledged these results. Electronically Signed   By: Maurine Simmering M.D.   On: 02/24/2021 17:33   US Venous Img Lower Bilateral (DVT)  Result Date: 02/25/2021 CLINICAL DATA:  Pulmonary  embolus.  On anticoagulation with heparin. EXAM: BILATERAL LOWER EXTREMITY VENOUS DOPPLER ULTRASOUND TECHNIQUE: Gray-scale sonography with graded compression, as well as color Doppler and duplex ultrasound were performed to evaluate the lower extremity deep venous systems from the level of the common femoral vein and including the common femoral, femoral, profunda femoral, popliteal and calf veins including the posterior tibial, peroneal and gastrocnemius veins when visible. The superficial great saphenous vein was also interrogated. Spectral Doppler was utilized to evaluate flow at rest and with distal augmentation maneuvers in the common femoral, femoral and popliteal veins. COMPARISON:  CTA PE, 02/24/2021. FINDINGS: RIGHT LOWER EXTREMITY Normal compressibility of the common femoral, superficial femoral, and popliteal veins, as well as the visualized calf veins. Visualized portions of profunda femoral vein and great saphenous vein unremarkable. No filling defects to suggest DVT on grayscale or color Doppler imaging. Doppler waveforms show normal direction of venous flow, normal respiratory plasticity and response to augmentation. OTHER Sonographic evidence of sluggish flow without discrete thrombus within the RIGHT popliteal vein. No evidence of superficial thrombophlebitis or abnormal fluid collection. Limitations: none LEFT LOWER EXTREMITY Partially-occlusive echogenic filling defect within the LEFT common femoral vein, and occlusive filling defect within the LEFT posterior tibial vein (see key images). OTHER No evidence of superficial thrombophlebitis or abnormal fluid collection. Limitations: none IMPRESSION: 1. LEFT lower extremity DVT, as above including, partially occlusive within the LEFT CFV and occlusive within the LEFT PTV. 2. Sluggish flow without discrete thrombus within the RIGHT popliteal vein. These results will be called to the ordering clinician or representative by the Radiologist Assistant, and  communication documented in the PACS or [employer]. Michaelle Birks, MD [facility] Electronically Signed   By: Michaelle Birks M.D.   On: 02/25/2021 11:56   DG Chest Port 1 View  Result Date: 02/24/2021 CLINICAL DATA:  Shortness of breath and cough.  Hypoxemia. EXAM: PORTABLE CHEST 1 VIEW COMPARISON:  Radiographs 02/10/2021 and 09/01/2020. CT 02/10/2021 and 12/03/2020. FINDINGS: 1417 hours. There is similar volume loss in the right hemithorax with multifocal airspace opacities in the right lung. The left lung is clear. There is no pleural effusion or pneumothorax. The heart size and mediastinal contours are stable. The bones appear unremarkable. Telemetry leads overlie the chest. IMPRESSION: No significant change in multifocal right lung opacities with associated volume loss. In this patient with known right lung cancer and recent radiation therapy, these findings are nonspecific and may in part reflect radiation pneumonitis. Superimposed pneumonia difficult to completely exclude, although the left lung remains clear. Correlate clinically. Electronically Signed   By: Richardean Sale M.D.   On: 02/24/2021 14:51   ECHOCARDIOGRAM COMPLETE  Result Date: 02/25/2021  ECHOCARDIOGRAM REPORT   Patient Name:   MALIQUE DRISKILL Date of Exam: 02/25/2021 Medical Rec #:  [id number]    Height:       68.0 in Accession #:    [id number]   Weight:       192.9 lb Date of Birth:  19-Sep-1960     BSA:          2.013 m Patient Age:    12 years     BP:           128/80 mmHg Patient Gender: M            HR:           79 bpm. Exam Location:  [facility] Procedure: 2D Echo, Cardiac Doppler and Color Doppler Indications:    Pulmonary Embolus l26.09  History:        Patient has no prior history of Echocardiogram examinations.                 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  Sonographer:    Alvino Chapel RCS Referring Phys: [id number] [place]  1. Left ventricular ejection fraction, by estimation, is 60 to 65%. The left ventricle has normal function. The left ventricle has no regional wall motion abnormalities. There is mild left ventricular hypertrophy. Left ventricular diastolic parameters were normal.  2. Right ventricular systolic function is normal. The right ventricular size is normal. Tricuspid regurgitation signal is inadequate for assessing PA pressure.  3. The mitral valve is grossly normal. Trivial mitral valve regurgitation.  4. The aortic valve is tricuspid. Aortic valve regurgitation is not visualized.  5. The inferior vena cava is normal in size with greater than 50% respiratory variability, suggesting right atrial pressure of 3 mmHg. Comparison(s): No prior Echocardiogram. FINDINGS  Left Ventricle: Left ventricular ejection fraction, by estimation, is 60 to 65%. The left ventricle has normal function. The left ventricle has no regional wall motion abnormalities. The left ventricular internal cavity size was normal in size. There is  mild left ventricular hypertrophy. Left ventricular diastolic parameters were normal. Right Ventricle: The right ventricular size is normal. No increase in right ventricular wall thickness. Right ventricular systolic function is normal. Tricuspid regurgitation signal is inadequate for assessing PA pressure. Left Atrium: Left atrial size was normal in size. Right Atrium: Right atrial size was normal in size. Pericardium: There is no evidence of pericardial effusion. Mitral Valve: The mitral valve is grossly normal. Trivial mitral valve regurgitation. Tricuspid Valve: The tricuspid valve is grossly normal. Tricuspid valve regurgitation is mild. Aortic Valve: The aortic valve is tricuspid. There is mild aortic valve annular calcification. Aortic valve regurgitation is not visualized. Pulmonic  Valve: The pulmonic valve was grossly normal. Pulmonic valve regurgitation is trivial. Aorta: The aortic root is normal in size and structure. Venous: The inferior vena cava is normal in size with greater than 50% respiratory variability, suggesting right atrial pressure of 3 mmHg. IAS/Shunts: No atrial level shunt detected by color flow Doppler.  LEFT VENTRICLE PLAX 2D LVIDd:         5.00 cm   Diastology LVIDs:         3.00 cm   LV e'  medial:    9.90 cm/s LV PW:         1.10 cm   LV E/e' medial:  7.1 LV IVS:        1.10 cm   LV e' lateral:   12.10 cm/s LVOT diam:     2.00 cm   LV E/e' lateral: 5.8 LV SV:         68 LV SV Index:   34 LVOT Area:     3.14 cm  RIGHT VENTRICLE RV S prime:     14.50 cm/s TAPSE (M-mode): 2.3 cm LEFT ATRIUM             Index        RIGHT ATRIUM           Index LA diam:        4.40 cm 2.19 cm/m   RA Area:     14.30 cm LA Vol (A2C):   54.6 ml 27.13 ml/m  RA Volume:   34.10 ml  16.94 ml/m LA Vol (A4C):   44.5 ml 22.11 ml/m LA Biplane Vol: 50.1 ml 24.89 ml/m  AORTIC VALVE LVOT Vmax:   97.60 cm/s LVOT Vmean:  64.200 cm/s LVOT VTI:    0.216 m  AORTA Ao Root diam: 3.70 cm MITRAL VALVE MV Area (PHT): 3.48 cm    SHUNTS MV Decel Time: 218 msec    Systemic VTI:  0.22 m MV E velocity: 70.10 cm/s  Systemic Diam: 2.00 cm MV A velocity: 65.50 cm/s MV E/A ratio:  1.07 Rozann Lesches MD Electronically signed by Rozann Lesches MD Signature Date/Time: 02/25/2021/1:17:28 PM    Final       Scheduled Meds:  vitamin C  500 mg Oral Daily   feeding supplement  237 mL Oral BID BM   levothyroxine  112 mcg Oral q AM   LORazepam  0.5 mg Oral QHS   Rivaroxaban  15 mg Oral BID WC   Followed by   Derrill Memo ON 03/19/2021] rivaroxaban  20 mg Oral Q supper   Continuous Infusions:  ceFEPime (MAXIPIME) IV 2 g (02/26/21 0559)     LOS: 2 days      Time spent: 20 minutes   Dessa Phi, DO [facility] 02/26/2021, 10:58 AM   Available via Epic secure chat 7am-7pm After these hours,  please refer to coverage provider listed on [email or web address]

## 2021-02-26 NOTE — Progress Notes (Signed)
ANTICOAGULATION CONSULT NOTE  Pharmacy Consult for Heparin>> Xarelto Indication: pulmonary embolus  No Known Allergies  Patient Measurements: Height: 5\' 8"  (172.7 cm) Weight: 87.5 kg (192 lb 14.4 oz) IBW/kg (Calculated) : 68.4  Heparin Dosing Weight: 87.1 kg  Vital Signs: Temp: 98.2 F (36.8 C) (10/21 0457) Temp Source: Oral (10/21 0457) BP: 139/93 (10/21 0457) Pulse Rate: 88 (10/21 0457)  Labs: Recent Labs    02/24/21 1423 02/24/21 1423 02/25/21 0317 02/25/21 1149 02/25/21 1900 02/26/21 0608  HGB 14.1  --  12.6*  --   --  12.4*  HCT 43.5  --  38.7*  --   --  37.4*  PLT 277  --  258  --   --  243  HEPARINUNFRC  --    < > 0.19* 0.27* 0.31 0.37  CREATININE 0.77  --  0.62  --   --   --    < > = values in this interval not displayed.     Estimated Creatinine Clearance: 106.9 mL/min (by C-G formula based on SCr of 0.62 mg/dL).   Medical History: Past Medical History:  Diagnosis Date   History of radiation therapy 11/12/2020   right lung  09/30/2020-11/12/2020  Dr Gery Pray   Hypothyroidism    Thyroid disease     Medications:  Scheduled:   vitamin C  500 mg Oral Daily   feeding supplement  237 mL Oral BID BM   levothyroxine  112 mcg Oral q AM   LORazepam  0.5 mg Oral QHS   Rivaroxaban  15 mg Oral BID WC   Followed by   Derrill Memo ON 03/19/2021] rivaroxaban  20 mg Oral Q supper   Infusions:   ceFEPime (MAXIPIME) IV 2 g (02/26/21 0559)    Assessment:  60 y.o. male who presented to the ED with complaints of difficulty breathing and cough of 3 days found to have multiple segmental and subsegmental pulmonary emboli in the left upper and lower lobes.   Patient was not on anticoagulation prior to admission. CBC stable, platelets are within normal limits.  Transition from heparin to Xarelto   Goal of Therapy:  Heparin level 0.3-0.7 units/ml Monitor platelets by anticoagulation protocol: Yes   Plan:  Stop heparin Start Xarelto 15 mg twice daily x 21 days  followed by Xarelto 20 mg daily. Continue to monitor H&H and platelets  Margot Ables, PharmD Clinical Pharmacist 02/26/2021 8:06 AM

## 2021-02-27 DIAGNOSIS — I82409 Acute embolism and thrombosis of unspecified deep veins of unspecified lower extremity: Secondary | ICD-10-CM

## 2021-02-27 DIAGNOSIS — I2699 Other pulmonary embolism without acute cor pulmonale: Secondary | ICD-10-CM

## 2021-02-27 DIAGNOSIS — C3491 Malignant neoplasm of unspecified part of right bronchus or lung: Secondary | ICD-10-CM

## 2021-02-27 HISTORY — DX: Acute embolism and thrombosis of unspecified deep veins of unspecified lower extremity: I82.409

## 2021-02-27 LAB — CBC
HCT: 37 % — ABNORMAL LOW (ref 39.0–52.0)
Hemoglobin: 12.1 g/dL — ABNORMAL LOW (ref 13.0–17.0)
MCH: 28.9 pg (ref 26.0–34.0)
MCHC: 32.7 g/dL (ref 30.0–36.0)
MCV: 88.3 fL (ref 80.0–100.0)
Platelets: 278 10*3/uL (ref 150–400)
RBC: 4.19 MIL/uL — ABNORMAL LOW (ref 4.22–5.81)
RDW: 12 % (ref 11.5–15.5)
WBC: 4.7 10*3/uL (ref 4.0–10.5)
nRBC: 0 % (ref 0.0–0.2)

## 2021-02-27 NOTE — Progress Notes (Signed)
PROGRESS NOTE    [name]  NKN:[id number] DOB: 1960/08/09 DOA: 02/24/2021 PCP: [name], MD     Brief Narrative:  Paul Mclean is a 60 y.o. male with medical history significant for lung cancer.  Patient presented to the ED with complaints of difficulty breathing and cough of 3 days duration. Patient was recently hospitalized 10/5-10/7 for acute respiratory failure secondary to pneumonia.  He completed 5 days of azithromycin and 7 days of cephalosporins (ceftriaxone>>cefdinir on discharge). Work-up revealed right-sided worsening infiltrates and left-sided PE.  Patient was started on IV vancomycin, cefepime, IV heparin.  Patient underwent echocardiogram as well as venous Doppler ultrasound which revealed left-sided DVT.   New events last 24 hours / Subjective: Patient reports feeling comfortable without respiratory complaints. He was not aware that [place] was not in his nares on my arrival to our encounter.  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 improved. On 1L and comfortable this am. - wean to room air as tolerated - pulse ox with ambulation to assess for need for home oxygen -continue Xarelto for DVT/PE tx - IV cefepime discontinued today. Completed 4 days of IV antibiotics while inpatient following extended course prior to admission.  - continue PRN albuterol  Squamous cell carcinoma of right lung -Follows with Dr. Earlie Server, completed 8 cycles of chemotherapy, currently on immunotherapy  Hypothyroidism -Continue Synthroid  DVT prophylaxis: IV heparin --> Xarelto Code Status: Full code Family Communication: No family at bedside, updated fianc over the phone Disposition Plan:  Status  is: Inpatient  Remains inpatient appropriate because: Remains on IV antibiotic, oxygen  Consultants:  None  Procedures:  None  Antimicrobials:  Anti-infectives (From admission, onward)    Start     Dose/Rate Route Frequency Ordered Stop   02/25/21 0800  vancomycin (VANCOCIN) IVPB 1000 mg/200 mL premix  Status:  Discontinued        1,000 mg 200 mL/hr over 60 Minutes Intravenous Every 12 hours 02/24/21 2017 02/25/21 1256   02/24/21 2200  ceFEPIme (MAXIPIME) 2 g in sodium chloride 0.9 % 100 mL IVPB        2 g 200 mL/hr over 30 Minutes Intravenous Every 8 hours 02/24/21 1907     02/24/21 2000  vancomycin (VANCOREADY) IVPB 1750 mg/350 mL        1,750 mg 175 mL/hr over 120 Minutes Intravenous  Once 02/24/21 1918 02/24/21 2331       Objective: Vitals:   02/26/21 0457 02/26/21 1411 02/26/21 2114 02/27/21 0637  BP: (!) 139/93 124/76 115/81 125/86  Pulse: 88 83 83 83  Resp: 18 (!) 24 17 19   Temp: 98.2 F (36.8 C) 97.8 F (36.6 C) 98.2 F (36.8 C) 98.2 F (36.8 C)  TempSrc: Oral Oral Oral Oral  SpO2: 95% 92% 97% 95%  Weight:      Height:        Intake/Output Summary (Last 24 hours) at 02/27/2021 0908 Last data filed at 02/26/2021 1500 Gross per 24 hour  Intake 240 ml  Output --  Net 240 ml    Filed Weights   02/24/21 1330 02/24/21 2028  Weight: 90.7 kg 87.5 kg    Examination:  General exam: NAD, oxygen not  fitted in nares on initial exam Respiratory system: normal respiratory effort, no coughing throughout exam.  Cardiovascular system: S1 & S2 heard, RRR. No murmurs. No pedal edema. Central nervous system: Alert and oriented. No focal neurological deficits. Speech clear.  Extremities: Symmetric in appearance, no pain in bilateral calves, no swelling Skin: No rashes, lesions or ulcers on exposed skin  Psychiatry: Mood & affect appropriate.   Data Reviewed: I have personally reviewed following labs and imaging studies  CBC: Recent Labs  Lab 02/24/21 1423  02/25/21 0317 02/26/21 0608 02/27/21 0536  WBC 7.3 6.3 5.1 4.7  NEUTROABS 5.1  --   --   --   HGB 14.1 12.6* 12.4* 12.1*  HCT 43.5 38.7* 37.4* 37.0*  MCV 88.4 88.4 87.4 88.3  PLT 277 258 243 782    Basic Metabolic Panel: Recent Labs  Lab 02/24/21 1423 02/25/21 0317  NA 132* 135  K 4.0 4.1  CL 96* 104  CO2 27 25  GLUCOSE 108* 114*  BUN 15 11  CREATININE 0.77 0.62  CALCIUM 8.3* 8.1*    GFR: Estimated Creatinine Clearance: 106.9 mL/min (by C-G formula based on SCr of 0.62 mg/dL). Liver Function Tests: Recent Labs  Lab 02/24/21 1423  AST 25  ALT 25  ALKPHOS 72  BILITOT 0.6  PROT 7.9  ALBUMIN 2.8*   Sepsis Labs: Recent Labs  Lab 02/24/21 1423  LATICACIDVEN 1.8    Scheduled Meds:  vitamin C  500 mg Oral Daily   feeding supplement  237 mL Oral BID BM   levothyroxine  112 mcg Oral q AM   LORazepam  0.5 mg Oral QHS   Rivaroxaban  15 mg Oral BID WC   Followed by   Derrill Memo ON 03/19/2021] rivaroxaban  20 mg Oral Q supper   Continuous Infusions:  ceFEPime (MAXIPIME) IV 2 g (02/27/21 0553)     LOS: 3 days    Time spent: 20 minutes   Richarda Osmond, DO [facility] 02/27/2021, 9:08 AM   Available via Epic secure chat 7am-7pm After these hours, please refer to coverage provider listed on [email or web address]

## 2021-02-28 MED ORDER — RIVAROXABAN 15 MG PO TABS: 15.0000 mg | ORAL_TABLET | Freq: Two times a day (BID) | ORAL | 0 refills | Status: DC

## 2021-02-28 MED ORDER — RIVAROXABAN 20 MG PO TABS: 20.0000 mg | ORAL_TABLET | Freq: Every day | ORAL | 3 refills | Status: DC

## 2021-02-28 MED ORDER — ENSURE ENLIVE PO LIQD: 237.0000 mL | Freq: Two times a day (BID) | ORAL | 12 refills | Status: DC

## 2021-02-28 MED ORDER — BENZONATATE 100 MG PO CAPS: 100.0000 mg | ORAL_CAPSULE | Freq: Three times a day (TID) | ORAL | 0 refills | Status: DC | PRN

## 2021-02-28 MED ORDER — GUAIFENESIN ER 600 MG PO TB12: 600.0000 mg | ORAL_TABLET | Freq: Two times a day (BID) | ORAL | 2 refills | Status: DC

## 2021-02-28 NOTE — Progress Notes (Signed)
SATURATION QUALIFICATIONS: (This note is used to comply with regulatory documentation for home oxygen)  Patient Saturations on Room Air at Rest = 92%  Patient Saturations on Room Air while Ambulating = 86%  Patient Saturations on 2 Liters of oxygen while Ambulating = 92%  Please briefly explain why patient needs home oxygen: Patient becomes short of breath and coughs while ambulating on room air. Oxygen saturations are at 86% on room air after ambulating 400 ft

## 2021-02-28 NOTE — Progress Notes (Signed)
Pt has discharge orders, discharge teaching given and no further questions at this time. Oxygen tank delivered to room. Xarelto discount done by pharmacy and papers placed in pateints discharge packet. Pt wheeled down to short stay entrance via w/c to vehicle accompanied by family member.

## 2021-02-28 NOTE — TOC Transition Note (Signed)
Transition of Care [facility]) - CM/SW Discharge Note   Patient Details  Name: TYMIR TERRAL MRN: [id number] Date of Birth: 25-Nov-1960  [facility] CM/SW Contact:  Natasha Bence, LCSW Phone Number: 02/28/2021, 11:29 AM   Clinical Narrative:    CSW notified of patient's readiness for discharge and DME needs. CSW placed referral for O2 with Caryl Pina from [place]. Caryl Pina agreeable to provide home O2. TOC signing off.   Final next level of care: Home/Self Care Barriers to Discharge: Barriers Resolved   Patient Goals and CMS Choice Patient states their goals for this hospitalization and ongoing recovery are:: Return home with [facility] CMS Medicare.gov Compare Post Acute Care list provided to:: Patient Choice offered to / list presented to : Patient  Discharge Placement                    Patient and family notified of of transfer: 02/28/21  Discharge Plan and Services                DME Arranged: Oxygen DME Agency: [employer] Date DME Agency Contacted: 02/28/21 Time DME Agency Contacted: [id number] Representative spoke with at DME Agency: [place] ([place]) Interventions     Readmission Risk Interventions No flowsheet data found.

## 2021-02-28 NOTE — Plan of Care (Signed)
  Problem: Education: Goal: Knowledge of General Education information will improve Description: Including pain rating scale, medication(s)/side effects and non-pharmacologic comfort measures Outcome: Progressing   Problem: Health Behavior/Discharge Planning: Goal: Ability to manage health-related needs will improve Outcome: Progressing   Problem: Clinical Measurements: Goal: Ability to maintain clinical measurements within normal limits will improve Outcome: Progressing   Problem: Clinical Measurements: Goal: Diagnostic test results will improve Outcome: Progressing   Problem: Clinical Measurements: Goal: Respiratory complications will improve Outcome: Progressing   Problem: Activity: Goal: Risk for activity intolerance will decrease Outcome: Progressing   Problem: Nutrition: Goal: Adequate nutrition will be maintained Outcome: Progressing   Problem: Safety: Goal: Ability to remain free from injury will improve Outcome: Progressing

## 2021-02-28 NOTE — Discharge Summary (Signed)
Physician Discharge Summary  Paul Mclean INO:[id number] DOB: December 22, 1960 DOA: 02/24/2021  PCP: [name], MD  Admit date: 02/24/2021  Discharge date: 02/28/2021  Admitted From:Home  Disposition:  Home  Recommendations for Outpatient Follow-up:  Follow up with PCP in 1-2 weeks Continue on Xarelto as prescribed for treatment of PE/DVT Continue on Mucinex and Tessalon Perles as needed for cough  Home Health: None  Equipment/Devices: Home 2 L nasal cannula oxygen  Discharge Condition:Stable  CODE STATUS: Full  Diet recommendation: Heart Healthy  Brief/Interim Summary:  Paul Mclean is a 61 y.o. male with medical history significant for lung cancer.  Patient presented to the ED with complaints of difficulty breathing and cough of 3 days duration. Patient was recently hospitalized 10/5-10/7 for acute respiratory failure secondary to pneumonia.  He completed 5 days of azithromycin and 7 days of cephalosporins (ceftriaxone>>cefdinir on discharge). Work-up revealed right-sided worsening infiltrates and left-sided PE.  Patient was started on IV vancomycin, cefepime, IV heparin.  Patient underwent echocardiogram as well as venous Doppler ultrasound which revealed left-sided DVT.  He has completed his course of antibiotics and has been transitioned to oral Xarelto which he is tolerating well.  He will require some home oxygen which will be arranged for him.  He continues to have significant cough for which she has been prescribed antitussives.  No other acute events noted during the course of this admission and he is stable for discharge.  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  Discharge Instructions  Discharge Instructions     Diet - low sodium heart healthy   Complete by: As directed    Increase activity slowly   Complete by: As directed       Allergies as of 02/28/2021   No Known Allergies      Medication List     TAKE these medications    albuterol 108 (90 Base) MCG/ACT inhaler Commonly known as: VENTOLIN HFA Inhale 2 puffs into the lungs every 6 (six) hours as needed for wheezing or shortness of breath.   Anoro Ellipta 62.5-25 MCG/ACT Aepb Generic drug: umeclidinium-vilanterol Inhale 1 puff into the lungs daily.   b complex vitamins capsule Take 1 capsule by mouth daily.   benzonatate 100 MG capsule Commonly known as: Tessalon Perles Take 1 capsule (100 mg total) by mouth 3 (three) times daily as needed for cough.   feeding supplement Liqd Take 237 mLs by mouth 2 (two) times daily between meals.   guaiFENesin 600 MG 12 hr tablet Commonly known as: Mucinex Take 1 tablet (600 mg total) by mouth 2 (two) times daily.   HYDROcodone bit-homatropine 5-1.5 MG/5ML syrup Commonly known as: Hycodan Take 5 mLs by mouth every 6 (six) hours as needed for cough.   levothyroxine 112 MCG tablet Commonly known as: SYNTHROID Take 112 mcg by mouth in the morning.   LORazepam 0.5 MG tablet Commonly known as: ATIVAN Take 1 tablet (0.5 mg total) by mouth at bedtime.   prochlorperazine 10 MG tablet Commonly known as: COMPAZINE TAKE 1 TABLET(10 MG) BY MOUTH EVERY 6 HOURS AS NEEDED FOR NAUSEA OR VOMITING What changed: See the new instructions.   Rivaroxaban 15 MG Tabs  tablet Commonly known as: XARELTO Take 1 tablet (15 mg total) by mouth 2 (two) times daily with a meal for 21 days.   rivaroxaban 20 MG Tabs tablet Commonly known as: XARELTO Take 1 tablet (20 mg total) by mouth daily with supper. Start taking on: March 22, 2021   vitamin C 500 MG tablet Commonly known as: ASCORBIC ACID Take  500 mg by mouth daily.               Durable Medical Equipment  (From admission, onward)           Start     Ordered   02/28/21 1016  For home use only DME oxygen  Once       Question Answer Comment  Length of Need 6 Months   Mode or (Route) Nasal cannula   Liters per Minute 2   Frequency Continuous (stationary and portable oxygen unit needed)   Oxygen conserving device Yes   Oxygen delivery system Gas      02/28/21 1016            Follow-up Information     [name], MD. Schedule an appointment as soon as possible for a visit in 1 week(s).   Specialty: Internal Medicine Contact information: [address] [place] [address] [phone]                No Known Allergies  Consultations: None   Procedures/Studies: CT Angio Chest Pulmonary Embolism (PE) W or WO Contrast  Result Date: 02/24/2021 CLINICAL DATA:  Acute hypoxic respiratory failure, recent PNA, lung cancer. EXAM: CT ANGIOGRAPHY CHEST WITH CONTRAST TECHNIQUE: Multidetector CT imaging of the chest was performed using the standard protocol during bolus administration of intravenous contrast. Multiplanar CT image reconstructions and MIPs were obtained to evaluate the vascular anatomy. CONTRAST:  151mL OMNIPAQUE IOHEXOL 350 MG/ML SOLN COMPARISON:  CT 02/10/2021 FINDINGS: Cardiovascular: Normal cardiac size.No pericardial disease.There are multiple segmental and subsegmental pulmonary emboli in the left upper and lower lobes.RV: LV ratio measures 1.0. Mediastinum/Nodes: There is right hilar lymphadenopathy.The thyroid is unremarkable.Esophagus is unremarkable. Lungs/Pleura: Worsening consolidation and ground-glass in the right apex and right middle lobe. There is new dense consolidation in the medial right lower lobe, with areas of improved aeration in the right lung base and periphery of the right lower lobe.Trace right pleural effusion.No pneumothoraxknown right hilar mass measures 3.8 x  1.8 cm, previously 4.3 x 2.1 cm, remeasured for consistency (series 4, image 47). Upper Abdomen: No acute abnormality. Musculoskeletal: No acute osseous abnormality.No suspicious lytic or blastic lesions. Review of the MIP images confirms the above findings. IMPRESSION: Multiple segmental and subsegmental pulmonary emboli in the left upper and lower lobes. No definite CT evidence of right heart strain. Worsening consolidation and ground-glass in the right upper lobe and right middle lobe with new dense consolidation in the right lower lobe. Findings are concerning for ongoing/worsening multifocal pneumonia, although there is an area of improvement in the periphery of the right lower lobe and superior segment of the right lower lobe. Critical Value/emergent results were called by telephone at the time of interpretation on 02/24/2021 at 5:33 pm to provider [facility] , who verbally acknowledged these results. Electronically Signed   By: Maurine Simmering M.D.   On: 02/24/2021 17:33   CT Angio Chest PE W/Cm &/Or Wo Cm  Result Date: 02/10/2021 CLINICAL DATA:  Chest pain and shortness of breath. EXAM: CT ANGIOGRAPHY CHEST WITH CONTRAST TECHNIQUE: Multidetector CT imaging of the  chest was performed using the standard protocol during bolus administration of intravenous contrast. Multiplanar CT image reconstructions and MIPs were obtained to evaluate the vascular anatomy. CONTRAST:  57mL OMNIPAQUE IOHEXOL 350 MG/ML SOLN COMPARISON:  December 03, 2020 FINDINGS: Cardiovascular: Satisfactory opacification of the pulmonary arteries to the segmental level. No evidence of pulmonary embolism. Normal heart size. No pericardial effusion. Mediastinum/Nodes: There is mild right hilar lymphadenopathy. The right hilar mass seen on the prior study is decreased in size and currently measures 1.6 cm x 1.1 cm x 0.8 cm (axial CT image 41, CT series 4). This measured 4.2 cm x 2.7 cm x 2.9 cm on the prior study. Thyroid gland, trachea, and  esophagus demonstrate no significant findings. Lungs/Pleura: Marked severity multifocal infiltrates are seen within the right upper lobe, right middle lobe and right lower lobe. A trace amount of atelectasis is seen within the inferior aspect of the left upper lobe and posterior aspect of the left lung base. There is no evidence of a pleural effusion or pneumothorax. Upper Abdomen: No acute abnormality. Musculoskeletal: Multilevel degenerative changes seen throughout the thoracic spine. Review of the MIP images confirms the above findings. IMPRESSION: 1. Marked severity multifocal right upper lobe, right middle lobe and right lower lobe infiltrates. 2. No evidence of pulmonary embolus. 3. Interval decrease in size of a anterior right hilar mass seen on the prior study. Electronically Signed   By: Virgina Norfolk M.D.   On: 02/10/2021 20:17   US Venous Img Lower Bilateral (DVT)  Result Date: 02/25/2021 CLINICAL DATA:  Pulmonary embolus.  On anticoagulation with heparin. EXAM: BILATERAL LOWER EXTREMITY VENOUS DOPPLER ULTRASOUND TECHNIQUE: Gray-scale sonography with graded compression, as well as color Doppler and duplex ultrasound were performed to evaluate the lower extremity deep venous systems from the level of the common femoral vein and including the common femoral, femoral, profunda femoral, popliteal and calf veins including the posterior tibial, peroneal and gastrocnemius veins when visible. The superficial great saphenous vein was also interrogated. Spectral Doppler was utilized to evaluate flow at rest and with distal augmentation maneuvers in the common femoral, femoral and popliteal veins. COMPARISON:  CTA PE, 02/24/2021. FINDINGS: RIGHT LOWER EXTREMITY Normal compressibility of the common femoral, superficial femoral, and popliteal veins, as well as the visualized calf veins. Visualized portions of profunda femoral vein and great saphenous vein unremarkable. No filling defects to suggest DVT on  grayscale or color Doppler imaging. Doppler waveforms show normal direction of venous flow, normal respiratory plasticity and response to augmentation. OTHER Sonographic evidence of sluggish flow without discrete thrombus within the RIGHT popliteal vein. No evidence of superficial thrombophlebitis or abnormal fluid collection. Limitations: none LEFT LOWER EXTREMITY Partially-occlusive echogenic filling defect within the LEFT common femoral vein, and occlusive filling defect within the LEFT posterior tibial vein (see key images). OTHER No evidence of superficial thrombophlebitis or abnormal fluid collection. Limitations: none IMPRESSION: 1. LEFT lower extremity DVT, as above including, partially occlusive within the LEFT CFV and occlusive within the LEFT PTV. 2. Sluggish flow without discrete thrombus within the RIGHT popliteal vein. These results will be called to the ordering clinician or representative by the Radiologist Assistant, and communication documented in the PACS or [employer]. Michaelle Birks, MD [facility] Electronically Signed   By: Michaelle Birks M.D.   On: 02/25/2021 11:56   DG Chest Port 1 View  Result Date: 02/24/2021 CLINICAL DATA:  Shortness of breath and cough.  Hypoxemia. EXAM: PORTABLE CHEST 1 VIEW  COMPARISON:  Radiographs 02/10/2021 and 09/01/2020. CT 02/10/2021 and 12/03/2020. FINDINGS: 1417 hours. There is similar volume loss in the right hemithorax with multifocal airspace opacities in the right lung. The left lung is clear. There is no pleural effusion or pneumothorax. The heart size and mediastinal contours are stable. The bones appear unremarkable. Telemetry leads overlie the chest. IMPRESSION: No significant change in multifocal right lung opacities with associated volume loss. In this patient with known right lung cancer and recent radiation therapy, these findings are nonspecific and may in part reflect radiation  pneumonitis. Superimposed pneumonia difficult to completely exclude, although the left lung remains clear. Correlate clinically. Electronically Signed   By: Richardean Sale M.D.   On: 02/24/2021 14:51   DG Chest Port 1 View  Result Date: 02/10/2021 CLINICAL DATA:  Shortness of breath. EXAM: PORTABLE CHEST 1 VIEW COMPARISON:  September 01, 2020. FINDINGS: Normal cardiac size. No pneumothorax or pleural effusion is noted. Left lung is clear. Bony thorax is unremarkable. Right lung opacity is noted concerning for pneumonia or atelectasis. Right hilar prominence is noted consistent with right hilar malignancy. IMPRESSION: Right hilar prominence is noted consistent with right hilar malignancy. Increased right lung opacity is noted concerning for pneumonia or atelectasis. Electronically Signed   By: Marijo Conception M.D.   On: 02/10/2021 18:15   ECHOCARDIOGRAM COMPLETE  Result Date: 02/25/2021    ECHOCARDIOGRAM REPORT   Patient Name:   Paul Mclean Date of Exam: 02/25/2021 Medical Rec #:  [id number]    Height:       68.0 in Accession #:    [id number]   Weight:       192.9 lb Date of Birth:  1961-01-18     BSA:          2.013 m Patient Age:    68 years     BP:           128/80 mmHg Patient Gender: M            HR:           79 bpm. Exam Location:  [facility] Procedure: 2D Echo, Cardiac Doppler and Color Doppler Indications:    Pulmonary Embolus l26.09  History:        Patient has no prior history of Echocardiogram examinations.                 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  Sonographer:    Alvino Chapel RCS Referring Phys: [phone] [place]  1. Left ventricular ejection fraction, by estimation, is 60 to 65%. The left ventricle has normal function. The left ventricle has no regional wall motion abnormalities. There is mild left ventricular hypertrophy. Left  ventricular diastolic parameters were normal.  2. Right ventricular systolic function is normal. The right ventricular size is normal. Tricuspid regurgitation signal is inadequate for assessing PA pressure.  3. The mitral valve is grossly normal. Trivial mitral valve regurgitation.  4. The aortic valve is tricuspid. Aortic valve regurgitation is not visualized.  5. The inferior vena cava is normal in size with greater than 50% respiratory variability, suggesting right atrial pressure of 3 mmHg. Comparison(s): No prior Echocardiogram. FINDINGS  Left Ventricle: Left  ventricular ejection fraction, by estimation, is 60 to 65%. The left ventricle has normal function. The left ventricle has no regional wall motion abnormalities. The left ventricular internal cavity size was normal in size. There is  mild left ventricular hypertrophy. Left ventricular diastolic parameters were normal. Right Ventricle: The right ventricular size is normal. No increase in right ventricular wall thickness. Right ventricular systolic function is normal. Tricuspid regurgitation signal is inadequate for assessing PA pressure. Left Atrium: Left atrial size was normal in size. Right Atrium: Right atrial size was normal in size. Pericardium: There is no evidence of pericardial effusion. Mitral Valve: The mitral valve is grossly normal. Trivial mitral valve regurgitation. Tricuspid Valve: The tricuspid valve is grossly normal. Tricuspid valve regurgitation is mild. Aortic Valve: The aortic valve is tricuspid. There is mild aortic valve annular calcification. Aortic valve regurgitation is not visualized. Pulmonic Valve: The pulmonic valve was grossly normal. Pulmonic valve regurgitation is trivial. Aorta: The aortic root is normal in size and structure. Venous: The inferior vena cava is normal in size with greater than 50% respiratory variability, suggesting right atrial pressure of 3 mmHg. IAS/Shunts: No atrial level shunt detected by color flow  Doppler.  LEFT VENTRICLE PLAX 2D LVIDd:         5.00 cm   Diastology LVIDs:         3.00 cm   LV e' medial:    9.90 cm/s LV PW:         1.10 cm   LV E/e' medial:  7.1 LV IVS:        1.10 cm   LV e' lateral:   12.10 cm/s LVOT diam:     2.00 cm   LV E/e' lateral: 5.8 LV SV:         68 LV SV Index:   34 LVOT Area:     3.14 cm  RIGHT VENTRICLE RV S prime:     14.50 cm/s TAPSE (M-mode): 2.3 cm LEFT ATRIUM             Index        RIGHT ATRIUM           Index LA diam:        4.40 cm 2.19 cm/m   RA Area:     14.30 cm LA Vol (A2C):   54.6 ml 27.13 ml/m  RA Volume:   34.10 ml  16.94 ml/m LA Vol (A4C):   44.5 ml 22.11 ml/m LA Biplane Vol: 50.1 ml 24.89 ml/m  AORTIC VALVE LVOT Vmax:   97.60 cm/s LVOT Vmean:  64.200 cm/s LVOT VTI:    0.216 m  AORTA Ao Root diam: 3.70 cm MITRAL VALVE MV Area (PHT): 3.48 cm    SHUNTS MV Decel Time: 218 msec    Systemic VTI:  0.22 m MV E velocity: 70.10 cm/s  Systemic Diam: 2.00 cm MV A velocity: 65.50 cm/s MV E/A ratio:  1.07 Rozann Lesches MD Electronically signed by Rozann Lesches MD Signature Date/Time: 02/25/2021/1:17:28 PM    Final      Discharge Exam: Vitals:   02/28/21 0637 02/28/21 0638  BP:    Pulse:    Resp:    Temp:    SpO2: 91% 92%   Vitals:   02/28/21 0635 02/28/21 0636 02/28/21 0637 02/28/21 0638  BP:      Pulse:      Resp:      Temp:      TempSrc:      SpO2: 90%  91% 91% 92%  Weight:      Height:        General: Pt is alert, awake, not in acute distress Cardiovascular: RRR, S1/S2 +, no rubs, no gallops Respiratory: CTA bilaterally, no wheezing, no rhonchi, currently on 2 L nasal cannula oxygen Abdominal: Soft, NT, ND, bowel sounds + Extremities: no edema, no cyanosis    The results of significant diagnostics from this hospitalization (including imaging, microbiology, ancillary and laboratory) are listed below for reference.     Microbiology: Recent Results (from the past 240 hour(s))  Resp Panel by RT-PCR (Flu A&B, Covid) Nasopharyngeal  Swab     Status: None   Collection Time: 02/24/21  2:45 PM   Specimen: Nasopharyngeal Swab; Nasopharyngeal(NP) swabs in vial transport medium  Result Value Ref Range Status   SARS Coronavirus 2 by RT PCR NEGATIVE NEGATIVE Final    Comment: (NOTE) SARS-CoV-2 target nucleic acids are NOT DETECTED.  The SARS-CoV-2 RNA is generally detectable in upper respiratory specimens during the acute phase of infection. The lowest concentration of SARS-CoV-2 viral copies this assay can detect is 138 copies/mL. A negative result does not preclude SARS-Cov-2 infection and should not be used as the sole basis for treatment or other patient management decisions. A negative result may occur with  improper specimen collection/handling, submission of specimen other than nasopharyngeal swab, presence of viral mutation(s) within the areas targeted by this assay, and inadequate number of viral copies(<138 copies/mL). A negative result must be combined with clinical observations, patient history, and epidemiological information. The expected result is Negative.  Fact Sheet for Patients:  EntrepreneurPulse.com.au  Fact Sheet for Healthcare Providers:  IncredibleEmployment.be  This test is no t yet approved or cleared by the [place] FDA and  has been authorized for detection and/or diagnosis of SARS-CoV-2 by FDA under an Emergency Use Authorization (EUA). This EUA will remain  in effect (meaning this test can be used) for the duration of the COVID-19 declaration under Section 564(b)(1) of the Act, 21 U.S.C.section 360bbb-3(b)(1), unless the authorization is terminated  or revoked sooner.       Influenza A by PCR NEGATIVE NEGATIVE Final   Influenza B by PCR NEGATIVE NEGATIVE Final    Comment: (NOTE) The Xpert Xpress SARS-CoV-2/FLU/RSV plus assay is intended as an aid in the diagnosis of influenza from Nasopharyngeal swab specimens and should not be used as a sole  basis for treatment. Nasal washings and aspirates are unacceptable for Xpert Xpress SARS-CoV-2/FLU/RSV testing.  Fact Sheet for Patients: EntrepreneurPulse.com.au  Fact Sheet for Healthcare Providers: IncredibleEmployment.be  This test is not yet approved or cleared by the [place] FDA and has been authorized for detection and/or diagnosis of SARS-CoV-2 by FDA under an Emergency Use Authorization (EUA). This EUA will remain in effect (meaning this test can be used) for the duration of the COVID-19 declaration under Section 564(b)(1) of the Act, 21 U.S.C. section 360bbb-3(b)(1), unless the authorization is terminated or revoked.  Performed at [facility], [address] [place], [place] [address]   MRSA Next Gen by PCR, Nasal     Status: None   Collection Time: 02/25/21  7:30 AM   Specimen: Nasal Mucosa; Nasal Swab  Result Value Ref Range Status   MRSA by PCR Next Gen NOT DETECTED NOT DETECTED Final    Comment: (NOTE) The GeneXpert MRSA Assay (FDA approved for NASAL specimens only), is one component of a comprehensive MRSA colonization surveillance program. It is not intended to diagnose MRSA infection nor  to guide or monitor treatment for MRSA infections. Test performance is not FDA approved in patients less than 49 years old. Performed at [facility], [address] [place], [place] [address]      Labs: BNP (last 3 results) No results for input(s): BNP in the last 8760 hours. Basic Metabolic Panel: Recent Labs  Lab 02/24/21 1423 02/25/21 0317  NA 132* 135  K 4.0 4.1  CL 96* 104  CO2 27 25  GLUCOSE 108* 114*  BUN 15 11  CREATININE 0.77 0.62  CALCIUM 8.3* 8.1*   Liver Function Tests: Recent Labs  Lab 02/24/21 1423  AST 25  ALT 25  ALKPHOS 72  BILITOT 0.6  PROT 7.9  ALBUMIN 2.8*   No results for input(s): LIPASE, AMYLASE in the last 168 hours. No results for input(s): AMMONIA in the last 168 hours. CBC: Recent  Labs  Lab 02/24/21 1423 02/25/21 0317 02/26/21 0608 02/27/21 0536  WBC 7.3 6.3 5.1 4.7  NEUTROABS 5.1  --   --   --   HGB 14.1 12.6* 12.4* 12.1*  HCT 43.5 38.7* 37.4* 37.0*  MCV 88.4 88.4 87.4 88.3  PLT 277 258 243 278   Cardiac Enzymes: No results for input(s): CKTOTAL, CKMB, CKMBINDEX, TROPONINI in the last 168 hours. BNP: Invalid input(s): POCBNP CBG: No results for input(s): GLUCAP in the last 168 hours. D-Dimer No results for input(s): DDIMER in the last 72 hours. Hgb A1c No results for input(s): HGBA1C in the last 72 hours. Lipid Profile No results for input(s): CHOL, HDL, LDLCALC, TRIG, CHOLHDL, LDLDIRECT in the last 72 hours. Thyroid function studies No results for input(s): TSH, T4TOTAL, T3FREE, THYROIDAB in the last 72 hours.  Invalid input(s): FREET3 Anemia work up No results for input(s): VITAMINB12, FOLATE, FERRITIN, TIBC, IRON, RETICCTPCT in the last 72 hours. Urinalysis    Component Value Date/Time   COLORURINE YELLOW 08/24/2017 0050   APPEARANCEUR CLEAR 08/24/2017 0050   LABSPEC >1.046 (H) 08/24/2017 0050   PHURINE 5.0 08/24/2017 0050   GLUCOSEU NEGATIVE 08/24/2017 0050   HGBUR NEGATIVE 08/24/2017 0050   BILIRUBINUR NEGATIVE 08/24/2017 0050   KETONESUR NEGATIVE 08/24/2017 0050   PROTEINUR NEGATIVE 08/24/2017 0050   UROBILINOGEN 0.2 11/21/2009 2316   NITRITE NEGATIVE 08/24/2017 0050   LEUKOCYTESUR NEGATIVE 08/24/2017 0050   Sepsis Labs Invalid input(s): PROCALCITONIN,  WBC,  LACTICIDVEN Microbiology Recent Results (from the past 240 hour(s))  Resp Panel by RT-PCR (Flu A&B, Covid) Nasopharyngeal Swab     Status: None   Collection Time: 02/24/21  2:45 PM   Specimen: Nasopharyngeal Swab; Nasopharyngeal(NP) swabs in vial transport medium  Result Value Ref Range Status   SARS Coronavirus 2 by RT PCR NEGATIVE NEGATIVE Final    Comment: (NOTE) SARS-CoV-2 target nucleic acids are NOT DETECTED.  The SARS-CoV-2 RNA is generally detectable in upper  respiratory specimens during the acute phase of infection. The lowest concentration of SARS-CoV-2 viral copies this assay can detect is 138 copies/mL. A negative result does not preclude SARS-Cov-2 infection and should not be used as the sole basis for treatment or other patient management decisions. A negative result may occur with  improper specimen collection/handling, submission of specimen other than nasopharyngeal swab, presence of viral mutation(s) within the areas targeted by this assay, and inadequate number of viral copies(<138 copies/mL). A negative result must be combined with clinical observations, patient history, and epidemiological information. The expected result is Negative.  Fact Sheet for Patients:  EntrepreneurPulse.com.au  Fact Sheet for Healthcare Providers:  IncredibleEmployment.be  This test is no t yet approved or cleared by the [place] and  has been authorized for detection and/or diagnosis of SARS-CoV-2 by FDA under an Emergency Use Authorization (EUA). This EUA will remain  in effect (meaning this test can be used) for the duration of the COVID-19 declaration under Section 564(b)(1) of the Act, 21 U.S.C.section 360bbb-3(b)(1), unless the authorization is terminated  or revoked sooner.       Influenza A by PCR NEGATIVE NEGATIVE Final   Influenza B by PCR NEGATIVE NEGATIVE Final    Comment: (NOTE) The Xpert Xpress SARS-CoV-2/FLU/RSV plus assay is intended as an aid in the diagnosis of influenza from Nasopharyngeal swab specimens and should not be used as a sole basis for treatment. Nasal washings and aspirates are unacceptable for Xpert Xpress SARS-CoV-2/FLU/RSV testing.  Fact Sheet for Patients: EntrepreneurPulse.com.au  Fact Sheet for Healthcare Providers: IncredibleEmployment.be  This test is not yet approved or cleared by the [place] FDA and has been  authorized for detection and/or diagnosis of SARS-CoV-2 by FDA under an Emergency Use Authorization (EUA). This EUA will remain in effect (meaning this test can be used) for the duration of the COVID-19 declaration under Section 564(b)(1) of the Act, 21 U.S.C. section 360bbb-3(b)(1), unless the authorization is terminated or revoked.  Performed at [facility], [address] [place], [place] [address]   MRSA Next Gen by PCR, Nasal     Status: None   Collection Time: 02/25/21  7:30 AM   Specimen: Nasal Mucosa; Nasal Swab  Result Value Ref Range Status   MRSA by PCR Next Gen NOT DETECTED NOT DETECTED Final    Comment: (NOTE) The GeneXpert MRSA Assay (FDA approved for NASAL specimens only), is one component of a comprehensive MRSA colonization surveillance program. It is not intended to diagnose MRSA infection nor to guide or monitor treatment for MRSA infections. Test performance is not FDA approved in patients less than 22 years old. Performed at [facility], [address] [place], [place] [address]      Time coordinating discharge: 35 minutes  SIGNED:   Rodena Goldmann, DO [facility] 02/28/2021, 10:17 AM  If 7PM-7AM, please contact night-coverage [email or web address]

## 2021-03-05 ENCOUNTER — Other Ambulatory Visit: Payer: Self-pay

## 2021-03-05 ENCOUNTER — Ambulatory Visit (HOSPITAL_BASED_OUTPATIENT_CLINIC_OR_DEPARTMENT_OTHER)
Admission: RE | Admit: 2021-03-05 | Discharge: 2021-03-05 | Disposition: A | Discharge status: Home / Self Care | Payer: Managed Care, Other (non HMO) | Source: Ambulatory Visit | Attending: Internal Medicine | Admitting: Internal Medicine

## 2021-03-05 DIAGNOSIS — C3490 Malignant neoplasm of unspecified part of unspecified bronchus or lung: Secondary | ICD-10-CM | POA: Diagnosis present

## 2021-03-05 MED ORDER — IOHEXOL 300 MG/ML  SOLN
100.0000 mL | Freq: Once | INTRAMUSCULAR | Status: AC | PRN
  Administered 2021-03-05: 75 mL via INTRAVENOUS

## 2021-03-08 ENCOUNTER — Inpatient Hospital Stay (HOSPITAL_BASED_OUTPATIENT_CLINIC_OR_DEPARTMENT_OTHER): Payer: Managed Care, Other (non HMO) | Admitting: Internal Medicine

## 2021-03-08 ENCOUNTER — Other Ambulatory Visit: Payer: Self-pay

## 2021-03-08 ENCOUNTER — Inpatient Hospital Stay: Payer: Managed Care, Other (non HMO)

## 2021-03-08 VITALS — BP 111/73 | HR 88 | Temp 97.4°F | Resp 19 | Ht 68.0 in | Wt 192.0 lb

## 2021-03-08 DIAGNOSIS — E039 Hypothyroidism, unspecified: Secondary | ICD-10-CM | POA: Diagnosis not present

## 2021-03-08 DIAGNOSIS — Z79899 Other long term (current) drug therapy: Secondary | ICD-10-CM | POA: Insufficient documentation

## 2021-03-08 DIAGNOSIS — R059 Cough, unspecified: Secondary | ICD-10-CM | POA: Insufficient documentation

## 2021-03-08 DIAGNOSIS — G4700 Insomnia, unspecified: Secondary | ICD-10-CM | POA: Insufficient documentation

## 2021-03-08 DIAGNOSIS — J189 Pneumonia, unspecified organism: Secondary | ICD-10-CM | POA: Diagnosis not present

## 2021-03-08 DIAGNOSIS — R5383 Other fatigue: Secondary | ICD-10-CM | POA: Insufficient documentation

## 2021-03-08 DIAGNOSIS — C3491 Malignant neoplasm of unspecified part of right bronchus or lung: Secondary | ICD-10-CM

## 2021-03-08 DIAGNOSIS — Z9981 Dependence on supplemental oxygen: Secondary | ICD-10-CM

## 2021-03-08 DIAGNOSIS — I824Z2 Acute embolism and thrombosis of unspecified deep veins of left distal lower extremity: Secondary | ICD-10-CM | POA: Diagnosis not present

## 2021-03-08 DIAGNOSIS — Z7901 Long term (current) use of anticoagulants: Secondary | ICD-10-CM | POA: Insufficient documentation

## 2021-03-08 DIAGNOSIS — Z5112 Encounter for antineoplastic immunotherapy: Secondary | ICD-10-CM

## 2021-03-08 DIAGNOSIS — Z87891 Personal history of nicotine dependence: Secondary | ICD-10-CM | POA: Diagnosis not present

## 2021-03-08 LAB — CMP (CANCER CENTER ONLY)
ALT: 24 U/L (ref 0–44)
AST: 17 U/L (ref 15–41)
Albumin: 2.5 g/dL — ABNORMAL LOW (ref 3.5–5.0)
Alkaline Phosphatase: 74 U/L (ref 38–126)
Anion gap: 9 (ref 5–15)
BUN: 12 mg/dL (ref 6–20)
CO2: 26 mmol/L (ref 22–32)
Calcium: 9.2 mg/dL (ref 8.9–10.3)
Chloride: 103 mmol/L (ref 98–111)
Creatinine: 0.77 mg/dL (ref 0.61–1.24)
GFR, Estimated: >60 mL/min (ref >60)
Glucose, Bld: 167 mg/dL — ABNORMAL HIGH (ref 70–99)
Potassium: 4 mmol/L (ref 3.5–5.1)
Sodium: 138 mmol/L (ref 135–145)
Total Bilirubin: 0.6 mg/dL (ref 0.3–1.2)
Total Protein: 7.5 g/dL (ref 6.5–8.1)

## 2021-03-08 LAB — CBC WITH DIFFERENTIAL (CANCER CENTER ONLY)
Abs Immature Granulocytes: 0.02 10*3/uL (ref 0.00–0.07)
Basophils Absolute: 0 10*3/uL (ref 0.0–0.1)
Basophils Relative: 0 %
Eosinophils Absolute: 0.3 10*3/uL (ref 0.0–0.5)
Eosinophils Relative: 6 %
HCT: 39.3 % (ref 39.0–52.0)
Hemoglobin: 13.1 g/dL (ref 13.0–17.0)
Immature Granulocytes: 0 %
Lymphocytes Relative: 16 %
Lymphs Abs: 0.9 10*3/uL (ref 0.7–4.0)
MCH: 27.8 pg (ref 26.0–34.0)
MCHC: 33.3 g/dL (ref 30.0–36.0)
MCV: 83.3 fL (ref 80.0–100.0)
Monocytes Absolute: 0.6 10*3/uL (ref 0.1–1.0)
Monocytes Relative: 10 %
Neutro Abs: 3.7 10*3/uL (ref 1.7–7.7)
Neutrophils Relative %: 68 %
Platelet Count: 320 10*3/uL (ref 150–400)
RBC: 4.72 MIL/uL (ref 4.22–5.81)
RDW: 12.1 % (ref 11.5–15.5)
WBC Count: 5.5 10*3/uL (ref 4.0–10.5)
nRBC: 0 % (ref 0.0–0.2)

## 2021-03-08 LAB — TSH: TSH: 0.258 u[IU]/mL — ABNORMAL LOW (ref 0.320–4.118)

## 2021-03-08 MED ORDER — PREDNISONE 20 MG PO TABS: ORAL_TABLET | ORAL | 0 refills | Status: DC

## 2021-03-08 NOTE — Progress Notes (Signed)
[facility] [phone]   [phone]  OFFICE PROGRESS NOTE  [name], MD [address] [place] [address]  DIAGNOSIS:  1) Stage IIIb (T3, N2, M0) non-small cell lung cancer, squamous cell carcinoma presented with large right upper lobe perihilar mass with right hilar and subcarinal lymphadenopathy diagnosed in April 2022. 2) left lower extremity deep venous thrombosis diagnosed in October 2022.  Currently on treatment with Xarelto.  PRIOR THERAPY:  1) Concurrent chemoradiation with weekly carboplatin for AUC of 2 and paclitaxel 45 Mg/M2.  Status post 8 cycles.  Last dose was given November 10, 2020. 2) Consolidation treatment with immunotherapy with Imfinzi 1500 Mg IV every 4 weeks.  First dose December 14, 2020.  Status post 3 cycles.  This was discontinued secondary to intolerance with highly suspicious immunotherapy mediated pneumonitis.  CURRENT THERAPY: Observation  INTERVAL HISTORY: JERRAN TAPPAN 60 y.o. male returns to the clinic today for follow-up visit accompanied by his wife.  The patient was recently diagnosed with left lower extremity deep venous thrombosis but no evidence for pulmonary embolism.  He is currently on treatment with Xarelto and tolerating it fairly well.  He has no bleeding issues.  He continues to have significant cough and shortness of breath at baseline increased with exertion and he is currently on home oxygen.  He denied having any chest pain or hemoptysis.  He denied having any nausea, vomiting, diarrhea or constipation.  He has no headache or visual changes.  He has no recent weight loss or night sweats.  He has been tolerating his treatment with Imfinzi fairly well except for the worsening cough.  The patient is here today for evaluation with repeat CT scan of the chest for restaging of his disease.   MEDICAL HISTORY: Past Medical History:  Diagnosis Date   History of radiation therapy 11/12/2020   right lung   09/30/2020-11/12/2020  Dr Gery Pray   Hypothyroidism    Thyroid disease     ALLERGIES:  has No Known Allergies.  MEDICATIONS:  Current Outpatient Medications  Medication Sig Dispense Refill   albuterol (VENTOLIN HFA) 108 (90 Base) MCG/ACT inhaler Inhale 2 puffs into the lungs every 6 (six) hours as needed for wheezing or shortness of breath. 8 g 2   b complex vitamins capsule Take 1 capsule by mouth daily.     benzonatate (TESSALON PERLES) 100 MG capsule Take 1 capsule (100 mg total) by mouth 3 (three) times daily as needed for cough. 30 capsule 0   feeding supplement (ENSURE ENLIVE / ENSURE PLUS) LIQD Take 237 mLs by mouth 2 (two) times daily between meals. 237 mL 12   guaiFENesin (MUCINEX) 600 MG 12 hr tablet Take 1 tablet (600 mg total) by mouth 2 (two) times daily. 60 tablet 2   HYDROcodone bit-homatropine (HYCODAN) 5-1.5 MG/5ML syrup Take 5 mLs by mouth every 6 (six) hours as needed for cough. 240 mL 0   levothyroxine (SYNTHROID) 112 MCG tablet Take 112 mcg by mouth in the morning.     LORazepam (ATIVAN) 0.5 MG tablet Take 1 tablet (0.5 mg total) by mouth at bedtime. 20 tablet 0   prochlorperazine (COMPAZINE) 10 MG tablet TAKE 1 TABLET(10 MG) BY MOUTH EVERY 6 HOURS AS NEEDED FOR NAUSEA OR VOMITING (Patient taking differently: Take 10 mg by mouth every 6 (six) hours as needed for nausea or vomiting.) 30 tablet 2   Rivaroxaban (XARELTO) 15 MG TABS tablet Take 1 tablet (15  mg total) by mouth 2 (two) times daily with a meal for 21 days. 42 tablet 0   [START ON 03/22/2021] rivaroxaban (XARELTO) 20 MG TABS tablet Take 1 tablet (20 mg total) by mouth daily with supper. 30 tablet 3   umeclidinium-vilanterol (ANORO ELLIPTA) 62.5-25 MCG/INH AEPB Inhale 1 puff into the lungs daily. 30 each 2   vitamin C (ASCORBIC ACID) 500 MG tablet Take 500 mg by mouth daily.     No current facility-administered medications for this visit.    SURGICAL HISTORY:  Past Surgical History:  Procedure Laterality  Date   APPENDECTOMY     BRONCHIAL BIOPSY  09/01/2020   Procedure: BRONCHIAL BIOPSIES;  Surgeon: Garner Nash, DO;  Location: [facility];  Service: Pulmonary;;   BRONCHIAL BRUSHINGS  09/01/2020   Procedure: BRONCHIAL BRUSHINGS;  Surgeon: Garner Nash, DO;  Location: [facility];  Service: Pulmonary;;   BRONCHIAL NEEDLE ASPIRATION BIOPSY  09/01/2020   Procedure: BRONCHIAL NEEDLE ASPIRATION BIOPSIES;  Surgeon: Garner Nash, DO;  Location: [facility];  Service: Pulmonary;;   BRONCHIAL WASHINGS  09/01/2020   Procedure: BRONCHIAL WASHINGS;  Surgeon: Garner Nash, DO;  Location: [facility];  Service: Pulmonary;;   HERNIA REPAIR     INCISION AND DRAINAGE ABSCESS N/A 08/25/2017   Procedure: INCISION AND DRAINAGE perineal  ABSCESS;  Surgeon: Festus Aloe, MD;  Location: [facility];  Service: Urology;  Laterality: N/A;   VIDEO BRONCHOSCOPY WITH ENDOBRONCHIAL ULTRASOUND N/A 09/01/2020   Procedure: VIDEO BRONCHOSCOPY WITH ENDOBRONCHIAL ULTRASOUND;  Surgeon: Garner Nash, DO;  Location: [facility];  Service: Pulmonary;  Laterality: N/A;    REVIEW OF SYSTEMS:  Constitutional: positive for fatigue Eyes: negative Ears, nose, mouth, throat, and face: negative Respiratory: positive for cough and dyspnea on exertion Cardiovascular: negative Gastrointestinal: negative Genitourinary:negative Integument/breast: negative Hematologic/lymphatic: negative Musculoskeletal:negative Neurological: negative Behavioral/Psych: negative Endocrine: negative Allergic/Immunologic: negative   PHYSICAL EXAMINATION: General appearance: alert, cooperative, fatigued, and no distress Head: Normocephalic, without obvious abnormality, atraumatic Neck: no adenopathy, no JVD, supple, symmetrical, trachea midline, and thyroid not enlarged, symmetric, no tenderness/mass/nodules Lymph nodes: Cervical, supraclavicular, and axillary nodes normal. Resp: wheezes RUL Back: symmetric, no curvature. ROM normal.  No CVA tenderness. Cardio: regular rate and rhythm, S1, S2 normal, no murmur, click, rub or gallop GI: soft, non-tender; bowel sounds normal; no masses,  no organomegaly Extremities: extremities normal, atraumatic, no cyanosis or edema Neurologic: Alert and oriented X 3, normal strength and tone. Normal symmetric reflexes. Normal coordination and gait  ECOG PERFORMANCE STATUS: 1 - Symptomatic but completely ambulatory  Blood pressure 111/73, pulse 88, temperature (!) 97.4 F (36.3 C), temperature source Tympanic, resp. rate 19, height 5\' 8"  (1.727 m), weight 192 lb (87.1 kg), SpO2 93 %.  LABORATORY DATA: Lab Results  Component Value Date   WBC 4.7 02/27/2021   HGB 12.1 (L) 02/27/2021   HCT 37.0 (L) 02/27/2021   MCV 88.3 02/27/2021   PLT 278 02/27/2021      Chemistry      Component Value Date/Time   NA 135 02/25/2021 0317   K 4.1 02/25/2021 0317   CL 104 02/25/2021 0317   CO2 25 02/25/2021 0317   BUN 11 02/25/2021 0317   CREATININE 0.62 02/25/2021 0317   CREATININE 0.87 02/08/2021 0829      Component Value Date/Time   CALCIUM 8.1 (L) 02/25/2021 0317   ALKPHOS 72 02/24/2021 1423   AST 25 02/24/2021 1423   AST 21 02/08/2021 0829   ALT 25 02/24/2021 1423  ALT 25 02/08/2021 0829   BILITOT 0.6 02/24/2021 1423   BILITOT 0.5 02/08/2021 1275       RADIOGRAPHIC STUDIES: CT Chest W Contrast  Result Date: 03/07/2021 CLINICAL DATA:  60 year old male with history of non-small cell lung cancer (squamous cell carcinoma). Acute hypoxemic respiratory failure. Status post radiation therapy and chemotherapy (radiation therapy complete 11/12/2020). Possible hospital-acquired pneumonia. Follow-up study. EXAM: CT CHEST WITH CONTRAST TECHNIQUE: Multidetector CT imaging of the chest was performed during intravenous contrast administration. CONTRAST:  59mL OMNIPAQUE IOHEXOL 300 MG/ML  SOLN COMPARISON:  Chest CTA 02/24/2021. FINDINGS: Cardiovascular: Heart size is normal. There is no  significant pericardial fluid, thickening or pericardial calcification. Mild aortic atherosclerosis. No definite coronary artery calcifications. Mediastinum/Nodes: Prominent soft tissue in the right hilar region again noted (axial image 61 of series 2), currently measuring 2.5 x 1.8 cm (previously 3.8 x 1.8 cm on 02/24/2021). Esophagus is unremarkable in appearance. No axillary lymphadenopathy. Lungs/Pleura: Again noted are extensive regions of ground-glass attenuation, septal thickening, thickening of the peribronchovascular interstitium, regional architectural distortion, volume loss and frank airspace consolidation scattered throughout the right lung. The densest areas of airspace consolidation are in the medial aspect of the right lower lobe and posterior aspect of the right upper lobe, increased compared to the prior study. Overall, findings otherwise appear relatively similar to the prior examination. Small right pleural effusion, minimally increased in size compared to the prior study. Left lung is clear. No left pleural effusion. Mild diffuse bronchial wall thickening with mild centrilobular and paraseptal emphysema. Upper Abdomen: Aortic atherosclerosis. Low attenuation throughout the visualized hepatic parenchyma, compatible with hepatic steatosis. Musculoskeletal: Multiple Schmorl's nodes are noted throughout the visualized thoracic and lumbar spine. There are no aggressive appearing lytic or blastic lesions noted in the visualized portions of the skeleton. IMPRESSION: 1. The appearance of the right lung is very similar to the prior examination. Findings may simply reflect very severe postradiation pneumonitis, however, the possibility of superimposed multilobar bacterial pneumonia in the medial right lower lobe and posterior right upper lobe is not excluded, particularly in light of the increasing areas of consolidation in these regions when compared to the recent prior study. Further clinical evaluation  is recommended. 2. Prominent soft tissue in the right hilar region likely reflects residual lymphadenopathy, slightly decreased in size compared to the prior study. 3. Hepatic steatosis. 4. Aortic atherosclerosis. Electronically Signed   By: Vinnie Langton M.D.   On: 03/07/2021 07:32   CT Angio Chest Pulmonary Embolism (PE) W or WO Contrast  Result Date: 02/24/2021 CLINICAL DATA:  Acute hypoxic respiratory failure, recent PNA, lung cancer. EXAM: CT ANGIOGRAPHY CHEST WITH CONTRAST TECHNIQUE: Multidetector CT imaging of the chest was performed using the standard protocol during bolus administration of intravenous contrast. Multiplanar CT image reconstructions and MIPs were obtained to evaluate the vascular anatomy. CONTRAST:  140mL OMNIPAQUE IOHEXOL 350 MG/ML SOLN COMPARISON:  CT 02/10/2021 FINDINGS: Cardiovascular: Normal cardiac size.No pericardial disease.There are multiple segmental and subsegmental pulmonary emboli in the left upper and lower lobes.RV: LV ratio measures 1.0. Mediastinum/Nodes: There is right hilar lymphadenopathy.The thyroid is unremarkable.Esophagus is unremarkable. Lungs/Pleura: Worsening consolidation and ground-glass in the right apex and right middle lobe. There is new dense consolidation in the medial right lower lobe, with areas of improved aeration in the right lung base and periphery of the right lower lobe.Trace right pleural effusion.No pneumothoraxknown right hilar mass measures 3.8 x 1.8 cm, previously 4.3 x 2.1 cm, remeasured for consistency (series 4, image 47). Upper Abdomen:  No acute abnormality. Musculoskeletal: No acute osseous abnormality.No suspicious lytic or blastic lesions. Review of the MIP images confirms the above findings. IMPRESSION: Multiple segmental and subsegmental pulmonary emboli in the left upper and lower lobes. No definite CT evidence of right heart strain. Worsening consolidation and ground-glass in the right upper lobe and right middle lobe with new  dense consolidation in the right lower lobe. Findings are concerning for ongoing/worsening multifocal pneumonia, although there is an area of improvement in the periphery of the right lower lobe and superior segment of the right lower lobe. Critical Value/emergent results were called by telephone at the time of interpretation on 02/24/2021 at 5:33 pm to provider [facility] , who verbally acknowledged these results. Electronically Signed   By: Maurine Simmering M.D.   On: 02/24/2021 17:33   CT Angio Chest PE W/Cm &/Or Wo Cm  Result Date: 02/10/2021 CLINICAL DATA:  Chest pain and shortness of breath. EXAM: CT ANGIOGRAPHY CHEST WITH CONTRAST TECHNIQUE: Multidetector CT imaging of the chest was performed using the standard protocol during bolus administration of intravenous contrast. Multiplanar CT image reconstructions and MIPs were obtained to evaluate the vascular anatomy. CONTRAST:  50mL OMNIPAQUE IOHEXOL 350 MG/ML SOLN COMPARISON:  December 03, 2020 FINDINGS: Cardiovascular: Satisfactory opacification of the pulmonary arteries to the segmental level. No evidence of pulmonary embolism. Normal heart size. No pericardial effusion. Mediastinum/Nodes: There is mild right hilar lymphadenopathy. The right hilar mass seen on the prior study is decreased in size and currently measures 1.6 cm x 1.1 cm x 0.8 cm (axial CT image 41, CT series 4). This measured 4.2 cm x 2.7 cm x 2.9 cm on the prior study. Thyroid gland, trachea, and esophagus demonstrate no significant findings. Lungs/Pleura: Marked severity multifocal infiltrates are seen within the right upper lobe, right middle lobe and right lower lobe. A trace amount of atelectasis is seen within the inferior aspect of the left upper lobe and posterior aspect of the left lung base. There is no evidence of a pleural effusion or pneumothorax. Upper Abdomen: No acute abnormality. Musculoskeletal: Multilevel degenerative changes seen throughout the thoracic spine. Review of  the MIP images confirms the above findings. IMPRESSION: 1. Marked severity multifocal right upper lobe, right middle lobe and right lower lobe infiltrates. 2. No evidence of pulmonary embolus. 3. Interval decrease in size of a anterior right hilar mass seen on the prior study. Electronically Signed   By: Virgina Norfolk M.D.   On: 02/10/2021 20:17   US Venous Img Lower Bilateral (DVT)  Result Date: 02/25/2021 CLINICAL DATA:  Pulmonary embolus.  On anticoagulation with heparin. EXAM: BILATERAL LOWER EXTREMITY VENOUS DOPPLER ULTRASOUND TECHNIQUE: Gray-scale sonography with graded compression, as well as color Doppler and duplex ultrasound were performed to evaluate the lower extremity deep venous systems from the level of the common femoral vein and including the common femoral, femoral, profunda femoral, popliteal and calf veins including the posterior tibial, peroneal and gastrocnemius veins when visible. The superficial great saphenous vein was also interrogated. Spectral Doppler was utilized to evaluate flow at rest and with distal augmentation maneuvers in the common femoral, femoral and popliteal veins. COMPARISON:  CTA PE, 02/24/2021. FINDINGS: RIGHT LOWER EXTREMITY Normal compressibility of the common femoral, superficial femoral, and popliteal veins, as well as the visualized calf veins. Visualized portions of profunda femoral vein and great saphenous vein unremarkable. No filling defects to suggest DVT on grayscale or color Doppler imaging. Doppler waveforms show normal direction of venous flow, normal respiratory plasticity and  response to augmentation. OTHER Sonographic evidence of sluggish flow without discrete thrombus within the RIGHT popliteal vein. No evidence of superficial thrombophlebitis or abnormal fluid collection. Limitations: none LEFT LOWER EXTREMITY Partially-occlusive echogenic filling defect within the LEFT common femoral vein, and occlusive filling defect within the LEFT posterior  tibial vein (see key images). OTHER No evidence of superficial thrombophlebitis or abnormal fluid collection. Limitations: none IMPRESSION: 1. LEFT lower extremity DVT, as above including, partially occlusive within the LEFT CFV and occlusive within the LEFT PTV. 2. Sluggish flow without discrete thrombus within the RIGHT popliteal vein. These results will be called to the ordering clinician or representative by the Radiologist Assistant, and communication documented in the PACS or [employer]. Michaelle Birks, MD [facility] Electronically Signed   By: Michaelle Birks M.D.   On: 02/25/2021 11:56   DG Chest Port 1 View  Result Date: 02/24/2021 CLINICAL DATA:  Shortness of breath and cough.  Hypoxemia. EXAM: PORTABLE CHEST 1 VIEW COMPARISON:  Radiographs 02/10/2021 and 09/01/2020. CT 02/10/2021 and 12/03/2020. FINDINGS: 1417 hours. There is similar volume loss in the right hemithorax with multifocal airspace opacities in the right lung. The left lung is clear. There is no pleural effusion or pneumothorax. The heart size and mediastinal contours are stable. The bones appear unremarkable. Telemetry leads overlie the chest. IMPRESSION: No significant change in multifocal right lung opacities with associated volume loss. In this patient with known right lung cancer and recent radiation therapy, these findings are nonspecific and may in part reflect radiation pneumonitis. Superimposed pneumonia difficult to completely exclude, although the left lung remains clear. Correlate clinically. Electronically Signed   By: Richardean Sale M.D.   On: 02/24/2021 14:51   DG Chest Port 1 View  Result Date: 02/10/2021 CLINICAL DATA:  Shortness of breath. EXAM: PORTABLE CHEST 1 VIEW COMPARISON:  September 01, 2020. FINDINGS: Normal cardiac size. No pneumothorax or pleural effusion is noted. Left lung is clear. Bony thorax is unremarkable. Right lung opacity is noted concerning  for pneumonia or atelectasis. Right hilar prominence is noted consistent with right hilar malignancy. IMPRESSION: Right hilar prominence is noted consistent with right hilar malignancy. Increased right lung opacity is noted concerning for pneumonia or atelectasis. Electronically Signed   By: Marijo Conception M.D.   On: 02/10/2021 18:15   ECHOCARDIOGRAM COMPLETE  Result Date: 02/25/2021    ECHOCARDIOGRAM REPORT   Patient Name:   RASHAUD YBARBO Date of Exam: 02/25/2021 Medical Rec #:  [id number]    Height:       68.0 in Accession #:    [id number]   Weight:       192.9 lb Date of Birth:  January 19, 1961     BSA:          2.013 m Patient Age:    60 years     BP:           128/80 mmHg Patient Gender: M            HR:           79 bpm. Exam Location:  [facility] Procedure: 2D Echo, Cardiac Doppler and Color Doppler Indications:    Pulmonary Embolus l26.09  History:        Patient has no prior history of Echocardiogram examinations.                 Stage III squamous cell carcinoma of right lung ([place]), Severe  sepsis ([place]), Pulmonary embolism ([place]), History of radiation                 therapy (From [facility]).  Sonographer:    Alvino Chapel RCS Referring Phys: [phone] [place]  1. Left ventricular ejection fraction, by estimation, is 60 to 65%. The left ventricle has normal function. The left ventricle has no regional wall motion abnormalities. There is mild left ventricular hypertrophy. Left ventricular diastolic parameters were normal.  2. Right ventricular systolic function is normal. The right ventricular size is normal. Tricuspid regurgitation signal is inadequate for assessing PA pressure.  3. The mitral valve is grossly normal. Trivial mitral valve regurgitation.  4. The aortic valve is tricuspid. Aortic valve regurgitation is not visualized.  5. The inferior vena cava is normal in size with greater than 50% respiratory variability, suggesting right atrial pressure of 3 mmHg. Comparison(s):  No prior Echocardiogram. FINDINGS  Left Ventricle: Left ventricular ejection fraction, by estimation, is 60 to 65%. The left ventricle has normal function. The left ventricle has no regional wall motion abnormalities. The left ventricular internal cavity size was normal in size. There is  mild left ventricular hypertrophy. Left ventricular diastolic parameters were normal. Right Ventricle: The right ventricular size is normal. No increase in right ventricular wall thickness. Right ventricular systolic function is normal. Tricuspid regurgitation signal is inadequate for assessing PA pressure. Left Atrium: Left atrial size was normal in size. Right Atrium: Right atrial size was normal in size. Pericardium: There is no evidence of pericardial effusion. Mitral Valve: The mitral valve is grossly normal. Trivial mitral valve regurgitation. Tricuspid Valve: The tricuspid valve is grossly normal. Tricuspid valve regurgitation is mild. Aortic Valve: The aortic valve is tricuspid. There is mild aortic valve annular calcification. Aortic valve regurgitation is not visualized. Pulmonic Valve: The pulmonic valve was grossly normal. Pulmonic valve regurgitation is trivial. Aorta: The aortic root is normal in size and structure. Venous: The inferior vena cava is normal in size with greater than 50% respiratory variability, suggesting right atrial pressure of 3 mmHg. IAS/Shunts: No atrial level shunt detected by color flow Doppler.  LEFT VENTRICLE PLAX 2D LVIDd:         5.00 cm   Diastology LVIDs:         3.00 cm   LV e' medial:    9.90 cm/s LV PW:         1.10 cm   LV E/e' medial:  7.1 LV IVS:        1.10 cm   LV e' lateral:   12.10 cm/s LVOT diam:     2.00 cm   LV E/e' lateral: 5.8 LV SV:         68 LV SV Index:   34 LVOT Area:     3.14 cm  RIGHT VENTRICLE RV S prime:     14.50 cm/s TAPSE (M-mode): 2.3 cm LEFT ATRIUM             Index        RIGHT ATRIUM           Index LA diam:        4.40 cm 2.19 cm/m   RA Area:     14.30 cm  LA Vol (A2C):   54.6 ml 27.13 ml/m  RA Volume:   34.10 ml  16.94 ml/m LA Vol (A4C):   44.5 ml 22.11 ml/m LA Biplane Vol: 50.1 ml 24.89 ml/m  AORTIC VALVE LVOT Vmax:  97.60 cm/s LVOT Vmean:  64.200 cm/s LVOT VTI:    0.216 m  AORTA Ao Root diam: 3.70 cm MITRAL VALVE MV Area (PHT): 3.48 cm    SHUNTS MV Decel Time: 218 msec    Systemic VTI:  0.22 m MV E velocity: 70.10 cm/s  Systemic Diam: 2.00 cm MV A velocity: 65.50 cm/s MV E/A ratio:  1.07 Rozann Lesches MD Electronically signed by Rozann Lesches MD Signature Date/Time: 02/25/2021/1:17:28 PM    Final      ASSESSMENT AND PLAN: This is a very pleasant 60 years old white male recently diagnosed with stage IIIb (T3, N2, M0) non-small cell lung cancer, squamous cell carcinoma presented with large right upper lobe perihilar mass with right hilar and subcarinal lymphadenopathy diagnosed in April 2022. The patient underwent a course of concurrent chemoradiation with weekly carboplatin and paclitaxel status post 8 cycles.  The patient tolerated his treatment well except for fatigue. His scan showed partial response with improvement in the right hilar mass as well as resolution of the right upper lobe lung nodule. The patient started on treatment with consolidation immunotherapy with Imfinzi 1500 Mg IV every 4 weeks status post 3 cycles.  He has been tolerating the treatment well except for the worsening cough and shortness of breath. Repeat CT scan of the chest performed recently showed no evidence for disease progression but the patient has worsening radiation/immunotherapy mediated pneumonitis with worsening symptoms. I recommended for the patient to discontinue his current treatment with consolidation immunotherapy at this point. I will start him on a taper dose of prednisone starting at 1 mg/kg daily for 2 weeks and then will taper the dose gradually over the next 5-6 weeks. For the dry cough, the patient will continue on Hycodan. I will see him back  for follow-up visit in 3 weeks for evaluation and management of any adverse effect of his treatment. For the GI prophylaxis, I advised the patient to start taking omeprazole 1 capsule p.o. daily during his treatment with the steroid. The patient was advised to call immediately if he has any other concerning symptoms in the interval.  The patient voices understanding of current disease status and treatment options and is in agreement with the current care plan.  All questions were answered. The patient knows to call the clinic with any problems, questions or concerns. We can certainly see the patient much sooner if necessary.   Disclaimer: This note was dictated with voice recognition software. Similar sounding words can inadvertently be transcribed and may not be corrected upon review.

## 2021-03-09 ENCOUNTER — Telehealth: Payer: Self-pay | Admitting: Internal Medicine

## 2021-03-09 NOTE — Telephone Encounter (Signed)
Scheduled follow-up appointment per 10/31 los. Patient is aware.

## 2021-03-30 ENCOUNTER — Other Ambulatory Visit: Payer: Self-pay

## 2021-03-30 ENCOUNTER — Encounter: Payer: Self-pay | Admitting: Internal Medicine

## 2021-03-30 ENCOUNTER — Inpatient Hospital Stay: Payer: Managed Care, Other (non HMO)

## 2021-03-30 ENCOUNTER — Telehealth: Payer: Self-pay

## 2021-03-30 ENCOUNTER — Inpatient Hospital Stay: Payer: Managed Care, Other (non HMO) | Attending: Internal Medicine | Admitting: Internal Medicine

## 2021-03-30 VITALS — BP 109/79 | HR 82 | Temp 97.6°F | Resp 17 | Ht 68.0 in | Wt 204.7 lb

## 2021-03-30 DIAGNOSIS — Z79899 Other long term (current) drug therapy: Secondary | ICD-10-CM | POA: Diagnosis not present

## 2021-03-30 DIAGNOSIS — Z7901 Long term (current) use of anticoagulants: Secondary | ICD-10-CM | POA: Insufficient documentation

## 2021-03-30 DIAGNOSIS — Z923 Personal history of irradiation: Secondary | ICD-10-CM | POA: Insufficient documentation

## 2021-03-30 DIAGNOSIS — C3491 Malignant neoplasm of unspecified part of right bronchus or lung: Secondary | ICD-10-CM | POA: Diagnosis not present

## 2021-03-30 DIAGNOSIS — C3411 Malignant neoplasm of upper lobe, right bronchus or lung: Secondary | ICD-10-CM | POA: Insufficient documentation

## 2021-03-30 DIAGNOSIS — Z86711 Personal history of pulmonary embolism: Secondary | ICD-10-CM | POA: Diagnosis not present

## 2021-03-30 DIAGNOSIS — Z7952 Long term (current) use of systemic steroids: Secondary | ICD-10-CM | POA: Diagnosis not present

## 2021-03-30 DIAGNOSIS — Z9221 Personal history of antineoplastic chemotherapy: Secondary | ICD-10-CM | POA: Insufficient documentation

## 2021-03-30 DIAGNOSIS — C3490 Malignant neoplasm of unspecified part of unspecified bronchus or lung: Secondary | ICD-10-CM

## 2021-03-30 DIAGNOSIS — Z86718 Personal history of other venous thrombosis and embolism: Secondary | ICD-10-CM | POA: Diagnosis not present

## 2021-03-30 LAB — CBC WITH DIFFERENTIAL (CANCER CENTER ONLY)
Abs Immature Granulocytes: 0.29 10*3/uL — ABNORMAL HIGH (ref 0.00–0.07)
Basophils Absolute: 0.1 10*3/uL (ref 0.0–0.1)
Basophils Relative: 1 %
Eosinophils Absolute: 0.1 10*3/uL (ref 0.0–0.5)
Eosinophils Relative: 1 %
HCT: 41.8 % (ref 39.0–52.0)
Hemoglobin: 14 g/dL (ref 13.0–17.0)
Immature Granulocytes: 4 %
Lymphocytes Relative: 17 %
Lymphs Abs: 1.3 10*3/uL (ref 0.7–4.0)
MCH: 28.5 pg (ref 26.0–34.0)
MCHC: 33.5 g/dL (ref 30.0–36.0)
MCV: 85.1 fL (ref 80.0–100.0)
Monocytes Absolute: 0.7 10*3/uL (ref 0.1–1.0)
Monocytes Relative: 9 %
Neutro Abs: 5.4 10*3/uL (ref 1.7–7.7)
Neutrophils Relative %: 68 %
Platelet Count: 162 10*3/uL (ref 150–400)
RBC: 4.91 MIL/uL (ref 4.22–5.81)
RDW: 16.7 % — ABNORMAL HIGH (ref 11.5–15.5)
WBC Count: 7.8 10*3/uL (ref 4.0–10.5)
nRBC: 0 % (ref 0.0–0.2)

## 2021-03-30 LAB — CMP (CANCER CENTER ONLY)
ALT: 99 U/L — ABNORMAL HIGH (ref 0–44)
AST: 28 U/L (ref 15–41)
Albumin: 2.9 g/dL — ABNORMAL LOW (ref 3.5–5.0)
Alkaline Phosphatase: 85 U/L (ref 38–126)
Anion gap: 11 (ref 5–15)
BUN: 20 mg/dL (ref 6–20)
CO2: 24 mmol/L (ref 22–32)
Calcium: 8.6 mg/dL — ABNORMAL LOW (ref 8.9–10.3)
Chloride: 103 mmol/L (ref 98–111)
Creatinine: 0.83 mg/dL (ref 0.61–1.24)
GFR, Estimated: >60 mL/min (ref >60)
Glucose, Bld: 97 mg/dL (ref 70–99)
Potassium: 3.8 mmol/L (ref 3.5–5.1)
Sodium: 138 mmol/L (ref 135–145)
Total Bilirubin: 0.5 mg/dL (ref 0.3–1.2)
Total Protein: 6.2 g/dL — ABNORMAL LOW (ref 6.5–8.1)

## 2021-03-30 NOTE — Progress Notes (Signed)
[facility] [phone]   [phone]  OFFICE PROGRESS NOTE  [name], MD [address] [place] [address]  DIAGNOSIS:  1) Stage IIIb (T3, N2, M0) non-small cell lung cancer, squamous cell carcinoma presented with large right upper lobe perihilar mass with right hilar and subcarinal lymphadenopathy diagnosed in April 2022. 2) left lower extremity deep venous thrombosis diagnosed in October 2022.  Currently on treatment with Xarelto.  PRIOR THERAPY:  1) Concurrent chemoradiation with weekly carboplatin for AUC of 2 and paclitaxel 45 Mg/M2.  Status post 8 cycles.  Last dose was given November 10, 2020. 2) Consolidation treatment with immunotherapy with Imfinzi 1500 Mg IV every 4 weeks.  First dose December 14, 2020.  Status post 3 cycles.  This was discontinued secondary to intolerance with highly suspicious immunotherapy mediated pneumonitis.  CURRENT THERAPY: Observation  INTERVAL HISTORY: Paul Mclean 60 y.o. male returns to the clinic today for follow-up visit accompanied by his wife.  The patient is feeling fine today with no concerning complaints except for arthralgia in his knees.  He denied having any current chest pain but continues to have shortness of breath with exertion which significantly improved compared to few weeks ago.  He denied having any current nausea, vomiting, diarrhea or constipation.  He has no headache or visual changes.  He has no weight loss or night sweats.  The patient is currently on a tapered dose of prednisone.  He continues to tolerate his treatment with prednisone fairly well.  He is here today for evaluation and repeat blood work.    MEDICAL HISTORY: Past Medical History:  Diagnosis Date   History of radiation therapy 11/12/2020   right lung  09/30/2020-11/12/2020  Dr Gery Pray   Hypothyroidism    Thyroid disease     ALLERGIES:  has No Known Allergies.  MEDICATIONS:  Current Outpatient Medications   Medication Sig Dispense Refill   albuterol (VENTOLIN HFA) 108 (90 Base) MCG/ACT inhaler Inhale 2 puffs into the lungs every 6 (six) hours as needed for wheezing or shortness of breath. 8 g 2   b complex vitamins capsule Take 1 capsule by mouth daily.     benzonatate (TESSALON PERLES) 100 MG capsule Take 1 capsule (100 mg total) by mouth 3 (three) times daily as needed for cough. 30 capsule 0   feeding supplement (ENSURE ENLIVE / ENSURE PLUS) LIQD Take 237 mLs by mouth 2 (two) times daily between meals. 237 mL 12   guaiFENesin (MUCINEX) 600 MG 12 hr tablet Take 1 tablet (600 mg total) by mouth 2 (two) times daily. 60 tablet 2   HYDROcodone bit-homatropine (HYCODAN) 5-1.5 MG/5ML syrup Take 5 mLs by mouth every 6 (six) hours as needed for cough. 240 mL 0   levothyroxine (SYNTHROID) 112 MCG tablet Take 112 mcg by mouth in the morning.     LORazepam (ATIVAN) 0.5 MG tablet Take 1 tablet (0.5 mg total) by mouth at bedtime. 20 tablet 0   predniSONE (DELTASONE) 20 MG tablet 4 tablets p.o. daily for 2 weeks followed by 3 tablet p.o. daily for 1 week followed by 2 tablet p.o. daily for 1 week followed by 1 tablet p.o. daily for 1 week followed by half a tablet p.o. daily for 1 week. 102 tablet 0   prochlorperazine (COMPAZINE) 10 MG tablet TAKE 1 TABLET(10 MG) BY MOUTH EVERY 6 HOURS AS NEEDED FOR NAUSEA OR VOMITING (Patient taking differently: Take 10 mg by mouth every  6 (six) hours as needed for nausea or vomiting.) 30 tablet 2   Rivaroxaban (XARELTO) 15 MG TABS tablet Take 1 tablet (15 mg total) by mouth 2 (two) times daily with a meal for 21 days. 42 tablet 0   rivaroxaban (XARELTO) 20 MG TABS tablet Take 1 tablet (20 mg total) by mouth daily with supper. 30 tablet 3   umeclidinium-vilanterol (ANORO ELLIPTA) 62.5-25 MCG/INH AEPB Inhale 1 puff into the lungs daily. 30 each 2   vitamin C (ASCORBIC ACID) 500 MG tablet Take 500 mg by mouth daily.     No current facility-administered medications for this visit.     SURGICAL HISTORY:  Past Surgical History:  Procedure Laterality Date   APPENDECTOMY     BRONCHIAL BIOPSY  09/01/2020   Procedure: BRONCHIAL BIOPSIES;  Surgeon: Garner Nash, DO;  Location: [facility];  Service: Pulmonary;;   BRONCHIAL BRUSHINGS  09/01/2020   Procedure: BRONCHIAL BRUSHINGS;  Surgeon: Garner Nash, DO;  Location: [facility];  Service: Pulmonary;;   BRONCHIAL NEEDLE ASPIRATION BIOPSY  09/01/2020   Procedure: BRONCHIAL NEEDLE ASPIRATION BIOPSIES;  Surgeon: Garner Nash, DO;  Location: [facility];  Service: Pulmonary;;   BRONCHIAL WASHINGS  09/01/2020   Procedure: BRONCHIAL WASHINGS;  Surgeon: Garner Nash, DO;  Location: [facility];  Service: Pulmonary;;   HERNIA REPAIR     INCISION AND DRAINAGE ABSCESS N/A 08/25/2017   Procedure: INCISION AND DRAINAGE perineal  ABSCESS;  Surgeon: Festus Aloe, MD;  Location: [facility];  Service: Urology;  Laterality: N/A;   VIDEO BRONCHOSCOPY WITH ENDOBRONCHIAL ULTRASOUND N/A 09/01/2020   Procedure: VIDEO BRONCHOSCOPY WITH ENDOBRONCHIAL ULTRASOUND;  Surgeon: Garner Nash, DO;  Location: [facility];  Service: Pulmonary;  Laterality: N/A;    REVIEW OF SYSTEMS:  A comprehensive review of systems was negative except for: Constitutional: positive for fatigue Respiratory: positive for dyspnea on exertion Musculoskeletal: positive for arthralgias   PHYSICAL EXAMINATION: General appearance: alert, cooperative, fatigued, and no distress Head: Normocephalic, without obvious abnormality, atraumatic Neck: no adenopathy, no JVD, supple, symmetrical, trachea midline, and thyroid not enlarged, symmetric, no tenderness/mass/nodules Lymph nodes: Cervical, supraclavicular, and axillary nodes normal. Resp: wheezes RUL Back: symmetric, no curvature. ROM normal. No CVA tenderness. Cardio: regular rate and rhythm, S1, S2 normal, no murmur, click, rub or gallop GI: soft, non-tender; bowel sounds normal; no masses,  no  organomegaly Extremities: extremities normal, atraumatic, no cyanosis or edema  ECOG PERFORMANCE STATUS: 1 - Symptomatic but completely ambulatory  Blood pressure 109/79, pulse 82, temperature 97.6 F (36.4 C), temperature source Temporal, resp. rate 17, height 5\' 8"  (1.727 m), weight 204 lb 11.2 oz (92.9 kg), SpO2 97 %.  LABORATORY DATA: Lab Results  Component Value Date   WBC 7.8 03/30/2021   HGB 14.0 03/30/2021   HCT 41.8 03/30/2021   MCV 85.1 03/30/2021   PLT 162 03/30/2021      Chemistry      Component Value Date/Time   NA 138 03/08/2021 0808   K 4.0 03/08/2021 0808   CL 103 03/08/2021 0808   CO2 26 03/08/2021 0808   BUN 12 03/08/2021 0808   CREATININE 0.77 03/08/2021 0808      Component Value Date/Time   CALCIUM 9.2 03/08/2021 0808   ALKPHOS 74 03/08/2021 0808   AST 17 03/08/2021 0808   ALT 24 03/08/2021 0808   BILITOT 0.6 03/08/2021 0808       RADIOGRAPHIC STUDIES: CT Chest W Contrast  Result Date: 03/07/2021 CLINICAL DATA:  60 year old male  with history of non-small cell lung cancer (squamous cell carcinoma). Acute hypoxemic respiratory failure. Status post radiation therapy and chemotherapy (radiation therapy complete 11/12/2020). Possible hospital-acquired pneumonia. Follow-up study. EXAM: CT CHEST WITH CONTRAST TECHNIQUE: Multidetector CT imaging of the chest was performed during intravenous contrast administration. CONTRAST:  73mL OMNIPAQUE IOHEXOL 300 MG/ML  SOLN COMPARISON:  Chest CTA 02/24/2021. FINDINGS: Cardiovascular: Heart size is normal. There is no significant pericardial fluid, thickening or pericardial calcification. Mild aortic atherosclerosis. No definite coronary artery calcifications. Mediastinum/Nodes: Prominent soft tissue in the right hilar region again noted (axial image 61 of series 2), currently measuring 2.5 x 1.8 cm (previously 3.8 x 1.8 cm on 02/24/2021). Esophagus is unremarkable in appearance. No axillary lymphadenopathy. Lungs/Pleura:  Again noted are extensive regions of ground-glass attenuation, septal thickening, thickening of the peribronchovascular interstitium, regional architectural distortion, volume loss and frank airspace consolidation scattered throughout the right lung. The densest areas of airspace consolidation are in the medial aspect of the right lower lobe and posterior aspect of the right upper lobe, increased compared to the prior study. Overall, findings otherwise appear relatively similar to the prior examination. Small right pleural effusion, minimally increased in size compared to the prior study. Left lung is clear. No left pleural effusion. Mild diffuse bronchial wall thickening with mild centrilobular and paraseptal emphysema. Upper Abdomen: Aortic atherosclerosis. Low attenuation throughout the visualized hepatic parenchyma, compatible with hepatic steatosis. Musculoskeletal: Multiple Schmorl's nodes are noted throughout the visualized thoracic and lumbar spine. There are no aggressive appearing lytic or blastic lesions noted in the visualized portions of the skeleton. IMPRESSION: 1. The appearance of the right lung is very similar to the prior examination. Findings may simply reflect very severe postradiation pneumonitis, however, the possibility of superimposed multilobar bacterial pneumonia in the medial right lower lobe and posterior right upper lobe is not excluded, particularly in light of the increasing areas of consolidation in these regions when compared to the recent prior study. Further clinical evaluation is recommended. 2. Prominent soft tissue in the right hilar region likely reflects residual lymphadenopathy, slightly decreased in size compared to the prior study. 3. Hepatic steatosis. 4. Aortic atherosclerosis. Electronically Signed   By: Vinnie Langton M.D.   On: 03/07/2021 07:32     ASSESSMENT AND PLAN: This is a very pleasant 60 years old white male recently diagnosed with stage IIIb (T3, N2, M0)  non-small cell lung cancer, squamous cell carcinoma presented with large right upper lobe perihilar mass with right hilar and subcarinal lymphadenopathy diagnosed in April 2022. The patient underwent a course of concurrent chemoradiation with weekly carboplatin and paclitaxel status post 8 cycles.  The patient tolerated his treatment well except for fatigue. His scan showed partial response with improvement in the right hilar mass as well as resolution of the right upper lobe lung nodule. The patient started on treatment with consolidation immunotherapy with Imfinzi 1500 Mg IV every 4 weeks status post 3 cycles.  He has been tolerating the treatment well except for the worsening cough and shortness of breath. His restaging scan after cycle #3 was concerning for immunotherapy mediated pneumonitis and the patient is currently on a tapered dose of prednisone.  He is tolerating his treatment fairly well and started feeling a little bit better regarding the shortness of breath but continues to complain of arthralgia in his knees. I recommended for the patient to continue his taper dose of prednisone as planned. I will see him back for follow-up visit in 4 weeks for evaluation with  repeat CT scan of the chest for restaging of his disease and evaluation of the response of the pneumonitis to the steroid taper. For the dry cough, the patient will continue on Hycodan. For the recent pulmonary embolism, the patient will continue his current treatment with Xarelto. For the GI prophylaxis, I advised the patient to start taking omeprazole 1 capsule p.o. daily during his treatment with the steroid. He was advised to call immediately if he has any other concerning symptoms in the interval.  The patient voices understanding of current disease status and treatment options and is in agreement with the current care plan.  All questions were answered. The patient knows to call the clinic with any problems, questions or  concerns. We can certainly see the patient much sooner if necessary.   Disclaimer: This note was dictated with voice recognition software. Similar sounding words can inadvertently be transcribed and may not be corrected upon review.

## 2021-03-30 NOTE — Telephone Encounter (Signed)
Patient notified of Prior Authorization approval for Xarelto 20mg  tablets. Medication is approved through 03/30/2022

## 2021-04-05 ENCOUNTER — Ambulatory Visit: Payer: Managed Care, Other (non HMO) | Admitting: Internal Medicine

## 2021-04-05 ENCOUNTER — Other Ambulatory Visit: Payer: Self-pay | Admitting: Internal Medicine

## 2021-04-05 ENCOUNTER — Ambulatory Visit: Payer: Managed Care, Other (non HMO)

## 2021-04-05 ENCOUNTER — Other Ambulatory Visit: Payer: Managed Care, Other (non HMO)

## 2021-04-05 ENCOUNTER — Telehealth: Payer: Self-pay | Admitting: Medical Oncology

## 2021-04-05 NOTE — Telephone Encounter (Signed)
Pharmacist stated pt picked  up full prescription.

## 2021-04-05 NOTE — Telephone Encounter (Signed)
Pt finishes his last 3 prednisone tablets today . Does he need to get a refill?  No he does not need refill . Pharmacist gave pt a partial rx initially and the rest was picked up .

## 2021-04-05 NOTE — Telephone Encounter (Signed)
Duplicate. Gardiner Rhyme, RN

## 2021-04-06 NOTE — Telephone Encounter (Signed)
I LVM on Rew phone that Dr. Julien Nordmann  does not want to prescribe any more prednisone.

## 2021-05-12 ENCOUNTER — Ambulatory Visit (HOSPITAL_COMMUNITY)
Admission: RE | Admit: 2021-05-12 | Discharge: 2021-05-12 | Disposition: A | Discharge status: Home / Self Care | Payer: Managed Care, Other (non HMO) | Source: Ambulatory Visit | Attending: Internal Medicine | Admitting: Internal Medicine

## 2021-05-12 ENCOUNTER — Inpatient Hospital Stay: Payer: Managed Care, Other (non HMO) | Attending: Internal Medicine

## 2021-05-12 ENCOUNTER — Other Ambulatory Visit: Payer: Self-pay

## 2021-05-12 ENCOUNTER — Encounter (HOSPITAL_COMMUNITY): Payer: Self-pay

## 2021-05-12 DIAGNOSIS — Z79899 Other long term (current) drug therapy: Secondary | ICD-10-CM | POA: Insufficient documentation

## 2021-05-12 DIAGNOSIS — C3411 Malignant neoplasm of upper lobe, right bronchus or lung: Secondary | ICD-10-CM | POA: Insufficient documentation

## 2021-05-12 DIAGNOSIS — Z9221 Personal history of antineoplastic chemotherapy: Secondary | ICD-10-CM | POA: Insufficient documentation

## 2021-05-12 DIAGNOSIS — C3491 Malignant neoplasm of unspecified part of right bronchus or lung: Secondary | ICD-10-CM

## 2021-05-12 DIAGNOSIS — C3490 Malignant neoplasm of unspecified part of unspecified bronchus or lung: Secondary | ICD-10-CM | POA: Diagnosis not present

## 2021-05-12 DIAGNOSIS — Z86718 Personal history of other venous thrombosis and embolism: Secondary | ICD-10-CM | POA: Insufficient documentation

## 2021-05-12 DIAGNOSIS — Z7952 Long term (current) use of systemic steroids: Secondary | ICD-10-CM | POA: Insufficient documentation

## 2021-05-12 DIAGNOSIS — Z86711 Personal history of pulmonary embolism: Secondary | ICD-10-CM | POA: Insufficient documentation

## 2021-05-12 DIAGNOSIS — Z7901 Long term (current) use of anticoagulants: Secondary | ICD-10-CM | POA: Insufficient documentation

## 2021-05-12 DIAGNOSIS — Z923 Personal history of irradiation: Secondary | ICD-10-CM | POA: Insufficient documentation

## 2021-05-12 LAB — CMP (CANCER CENTER ONLY)
ALT: 25 U/L (ref 0–44)
AST: 22 U/L (ref 15–41)
Albumin: 3.8 g/dL (ref 3.5–5.0)
Alkaline Phosphatase: 67 U/L (ref 38–126)
Anion gap: 6 (ref 5–15)
BUN: 13 mg/dL (ref 6–20)
CO2: 30 mmol/L (ref 22–32)
Calcium: 9.6 mg/dL (ref 8.9–10.3)
Chloride: 101 mmol/L (ref 98–111)
Creatinine: 0.84 mg/dL (ref 0.61–1.24)
GFR, Estimated: >60 mL/min (ref >60)
Glucose, Bld: 113 mg/dL — ABNORMAL HIGH (ref 70–99)
Potassium: 4.4 mmol/L (ref 3.5–5.1)
Sodium: 137 mmol/L (ref 135–145)
Total Bilirubin: 1.2 mg/dL (ref 0.3–1.2)
Total Protein: 7.8 g/dL (ref 6.5–8.1)

## 2021-05-12 LAB — CBC WITH DIFFERENTIAL (CANCER CENTER ONLY)
Abs Immature Granulocytes: 0.05 10*3/uL (ref 0.00–0.07)
Basophils Absolute: 0 10*3/uL (ref 0.0–0.1)
Basophils Relative: 1 %
Eosinophils Absolute: 0.3 10*3/uL (ref 0.0–0.5)
Eosinophils Relative: 5 %
HCT: 40.9 % (ref 39.0–52.0)
Hemoglobin: 13.6 g/dL (ref 13.0–17.0)
Immature Granulocytes: 1 %
Lymphocytes Relative: 18 %
Lymphs Abs: 1.1 10*3/uL (ref 0.7–4.0)
MCH: 28.9 pg (ref 26.0–34.0)
MCHC: 33.3 g/dL (ref 30.0–36.0)
MCV: 86.8 fL (ref 80.0–100.0)
Monocytes Absolute: 1 10*3/uL (ref 0.1–1.0)
Monocytes Relative: 16 %
Neutro Abs: 3.6 10*3/uL (ref 1.7–7.7)
Neutrophils Relative %: 59 %
Platelet Count: 204 10*3/uL (ref 150–400)
RBC: 4.71 MIL/uL (ref 4.22–5.81)
RDW: 17.6 % — ABNORMAL HIGH (ref 11.5–15.5)
WBC Count: 6.1 10*3/uL (ref 4.0–10.5)
nRBC: 0 % (ref 0.0–0.2)

## 2021-05-12 LAB — TSH: TSH: 0.283 u[IU]/mL — ABNORMAL LOW (ref 0.320–4.118)

## 2021-05-12 MED ORDER — IOHEXOL 350 MG/ML SOLN
60.0000 mL | Freq: Once | INTRAVENOUS | Status: AC | PRN
  Administered 2021-05-12: 60 mL via INTRAVENOUS

## 2021-05-12 MED ORDER — SODIUM CHLORIDE (PF) 0.9 % IJ SOLN
INTRAMUSCULAR | Status: AC
  Filled 2021-05-12: qty 50

## 2021-05-19 ENCOUNTER — Telehealth: Payer: Self-pay | Admitting: Medical Oncology

## 2021-05-19 ENCOUNTER — Telehealth: Payer: Self-pay | Admitting: Internal Medicine

## 2021-05-19 NOTE — Telephone Encounter (Signed)
Sch per 1/11 hp inbasket, pt aware

## 2021-05-19 NOTE — Telephone Encounter (Signed)
Asking for results of CT scan. No LOS .Schedule message sent .

## 2021-05-20 ENCOUNTER — Encounter: Payer: Self-pay | Admitting: Internal Medicine

## 2021-05-20 ENCOUNTER — Other Ambulatory Visit: Payer: Self-pay | Admitting: Internal Medicine

## 2021-05-20 ENCOUNTER — Inpatient Hospital Stay: Payer: Managed Care, Other (non HMO) | Admitting: Internal Medicine

## 2021-05-20 ENCOUNTER — Telehealth: Payer: Self-pay | Admitting: Medical Oncology

## 2021-05-20 ENCOUNTER — Other Ambulatory Visit: Payer: Self-pay

## 2021-05-20 VITALS — BP 124/79 | HR 108 | Temp 97.4°F | Resp 19 | Ht 68.0 in | Wt 201.8 lb

## 2021-05-20 DIAGNOSIS — C3491 Malignant neoplasm of unspecified part of right bronchus or lung: Secondary | ICD-10-CM | POA: Diagnosis not present

## 2021-05-20 DIAGNOSIS — Z86711 Personal history of pulmonary embolism: Secondary | ICD-10-CM | POA: Diagnosis not present

## 2021-05-20 DIAGNOSIS — Z923 Personal history of irradiation: Secondary | ICD-10-CM | POA: Diagnosis not present

## 2021-05-20 DIAGNOSIS — Z86718 Personal history of other venous thrombosis and embolism: Secondary | ICD-10-CM | POA: Diagnosis not present

## 2021-05-20 DIAGNOSIS — C3490 Malignant neoplasm of unspecified part of unspecified bronchus or lung: Secondary | ICD-10-CM

## 2021-05-20 DIAGNOSIS — Z7952 Long term (current) use of systemic steroids: Secondary | ICD-10-CM | POA: Diagnosis not present

## 2021-05-20 DIAGNOSIS — C3411 Malignant neoplasm of upper lobe, right bronchus or lung: Secondary | ICD-10-CM | POA: Diagnosis present

## 2021-05-20 DIAGNOSIS — Z7901 Long term (current) use of anticoagulants: Secondary | ICD-10-CM | POA: Diagnosis not present

## 2021-05-20 DIAGNOSIS — Z9221 Personal history of antineoplastic chemotherapy: Secondary | ICD-10-CM | POA: Diagnosis not present

## 2021-05-20 DIAGNOSIS — Z79899 Other long term (current) drug therapy: Secondary | ICD-10-CM | POA: Diagnosis not present

## 2021-05-20 MED ORDER — BENZONATATE 100 MG PO CAPS: 100.0000 mg | ORAL_CAPSULE | Freq: Three times a day (TID) | ORAL | 0 refills | Status: DC | PRN

## 2021-05-20 NOTE — Progress Notes (Signed)
[facility] [phone]   [phone]  OFFICE PROGRESS NOTE  [name], MD [address] [place] [address]  DIAGNOSIS:  1) Stage IIIb (T3, N2, M0) non-small cell lung cancer, squamous cell carcinoma presented with large right upper lobe perihilar mass with right hilar and subcarinal lymphadenopathy diagnosed in April 2022. 2) left lower extremity deep venous thrombosis diagnosed in October 2022.  Currently on treatment with Xarelto.  PRIOR THERAPY:  1) Concurrent chemoradiation with weekly carboplatin for AUC of 2 and paclitaxel 45 Mg/M2.  Status post 8 cycles.  Last dose was given November 10, 2020. 2) Consolidation treatment with immunotherapy with Imfinzi 1500 Mg IV every 4 weeks.  First dose December 14, 2020.  Status post 3 cycles.  This was discontinued secondary to intolerance with highly suspicious immunotherapy mediated pneumonitis.  CURRENT THERAPY: Observation  INTERVAL HISTORY: Paul Mclean 61 y.o. male returns to the clinic today for follow-up visit accompanied by his wife.  The patient continues to complain of persistent dry cough that got worse recently.  He also has shortness of breath at baseline increased with exertion.  He was seen recently by his primary care physician and treated with Levaquin.  He also completed a tapered dose of prednisone 2 weeks ago.  He has a fall last night.  He has been in observation for more than 2 months now secondary to suspicious immunotherapy mediated pneumonitis treated with a tapered dose of prednisone. He had repeat CT scan of the chest performed recently and he is here for evaluation and discussion of his scan results  MEDICAL HISTORY: Past Medical History:  Diagnosis Date   History of radiation therapy 11/12/2020   right lung  09/30/2020-11/12/2020  Dr Gery Pray   Hypothyroidism    Thyroid disease     ALLERGIES:  has No Known Allergies.  MEDICATIONS:  Current Outpatient Medications   Medication Sig Dispense Refill   albuterol (VENTOLIN HFA) 108 (90 Base) MCG/ACT inhaler Inhale 2 puffs into the lungs every 6 (six) hours as needed for wheezing or shortness of breath. 8 g 2   b complex vitamins capsule Take 1 capsule by mouth daily.     benzonatate (TESSALON PERLES) 100 MG capsule Take 1 capsule (100 mg total) by mouth 3 (three) times daily as needed for cough. (Patient not taking: Reported on 03/30/2021) 30 capsule 0   feeding supplement (ENSURE ENLIVE / ENSURE PLUS) LIQD Take 237 mLs by mouth 2 (two) times daily between meals. 237 mL 12   guaiFENesin (MUCINEX) 600 MG 12 hr tablet Take 1 tablet (600 mg total) by mouth 2 (two) times daily. 60 tablet 2   levothyroxine (SYNTHROID) 112 MCG tablet Take 112 mcg by mouth in the morning.     predniSONE (DELTASONE) 20 MG tablet 4 tablets p.o. daily for 2 weeks followed by 3 tablet p.o. daily for 1 week followed by 2 tablet p.o. daily for 1 week followed by 1 tablet p.o. daily for 1 week followed by half a tablet p.o. daily for 1 week. 102 tablet 0   prochlorperazine (COMPAZINE) 10 MG tablet TAKE 1 TABLET(10 MG) BY MOUTH EVERY 6 HOURS AS NEEDED FOR NAUSEA OR VOMITING (Patient not taking: Reported on 03/30/2021) 30 tablet 2   Rivaroxaban (XARELTO) 15 MG TABS tablet Take 1 tablet (15 mg total) by mouth 2 (two) times daily with a meal for 21 days. 42 tablet 0   rivaroxaban (XARELTO) 20 MG TABS tablet  Take 1 tablet (20 mg total) by mouth daily with supper. 30 tablet 3   vitamin C (ASCORBIC ACID) 500 MG tablet Take 500 mg by mouth daily.     No current facility-administered medications for this visit.    SURGICAL HISTORY:  Past Surgical History:  Procedure Laterality Date   APPENDECTOMY     BRONCHIAL BIOPSY  09/01/2020   Procedure: BRONCHIAL BIOPSIES;  Surgeon: Garner Nash, DO;  Location: [facility];  Service: Pulmonary;;   BRONCHIAL BRUSHINGS  09/01/2020   Procedure: BRONCHIAL BRUSHINGS;  Surgeon: Garner Nash, DO;  Location:  [facility];  Service: Pulmonary;;   BRONCHIAL NEEDLE ASPIRATION BIOPSY  09/01/2020   Procedure: BRONCHIAL NEEDLE ASPIRATION BIOPSIES;  Surgeon: Garner Nash, DO;  Location: [facility];  Service: Pulmonary;;   BRONCHIAL WASHINGS  09/01/2020   Procedure: BRONCHIAL WASHINGS;  Surgeon: Garner Nash, DO;  Location: [facility];  Service: Pulmonary;;   HERNIA REPAIR     INCISION AND DRAINAGE ABSCESS N/A 08/25/2017   Procedure: INCISION AND DRAINAGE perineal  ABSCESS;  Surgeon: Festus Aloe, MD;  Location: [facility];  Service: Urology;  Laterality: N/A;   VIDEO BRONCHOSCOPY WITH ENDOBRONCHIAL ULTRASOUND N/A 09/01/2020   Procedure: VIDEO BRONCHOSCOPY WITH ENDOBRONCHIAL ULTRASOUND;  Surgeon: Garner Nash, DO;  Location: [facility];  Service: Pulmonary;  Laterality: N/A;    REVIEW OF SYSTEMS:  A comprehensive review of systems was negative except for: Constitutional: positive for fatigue Respiratory: positive for cough and dyspnea on exertion Gastrointestinal: positive for nausea Musculoskeletal: positive for arthralgias   PHYSICAL EXAMINATION: General appearance: alert, cooperative, fatigued, and no distress Head: Normocephalic, without obvious abnormality, atraumatic Neck: no adenopathy, no JVD, supple, symmetrical, trachea midline, and thyroid not enlarged, symmetric, no tenderness/mass/nodules Lymph nodes: Cervical, supraclavicular, and axillary nodes normal. Resp: clear to auscultation bilaterally Back: symmetric, no curvature. ROM normal. No CVA tenderness. Cardio: regular rate and rhythm, S1, S2 normal, no murmur, click, rub or gallop GI: soft, non-tender; bowel sounds normal; no masses,  no organomegaly Extremities: extremities normal, atraumatic, no cyanosis or edema  ECOG PERFORMANCE STATUS: 1 - Symptomatic but completely ambulatory  Blood pressure 124/79, pulse (!) 108, temperature (!) 97.4 F (36.3 C), temperature source Tympanic, resp. rate 19, height 5\' 8"  (1.727  m), weight 201 lb 12.8 oz (91.5 kg), SpO2 94 %.  LABORATORY DATA: Lab Results  Component Value Date   WBC 6.1 05/12/2021   HGB 13.6 05/12/2021   HCT 40.9 05/12/2021   MCV 86.8 05/12/2021   PLT 204 05/12/2021      Chemistry      Component Value Date/Time   NA 137 05/12/2021 1026   K 4.4 05/12/2021 1026   CL 101 05/12/2021 1026   CO2 30 05/12/2021 1026   BUN 13 05/12/2021 1026   CREATININE 0.84 05/12/2021 1026      Component Value Date/Time   CALCIUM 9.6 05/12/2021 1026   ALKPHOS 67 05/12/2021 1026   AST 22 05/12/2021 1026   ALT 25 05/12/2021 1026   BILITOT 1.2 05/12/2021 1026       RADIOGRAPHIC STUDIES: CT Chest W Contrast  Result Date: 05/12/2021 CLINICAL DATA:  Non-small-cell lung cancer, status post chemotherapy and immunotherapy EXAM: CT CHEST WITH CONTRAST TECHNIQUE: Multidetector CT imaging of the chest was performed during intravenous contrast administration. CONTRAST:  61mL OMNIPAQUE IOHEXOL 350 MG/ML SOLN COMPARISON:  Multiple priors, most recent chest CT dated March 05, 2021 FINDINGS: Cardiovascular: Normal heart size. No pericardial effusion. No significant coronary artery calcifications.  No significant atherosclerotic disease of the thoracic aorta. Mediastinum/Nodes: No pathologically enlarged lymph nodes seen in the chest. Esophagus is unremarkable. Lungs/Pleura: Right paramediastinal opacities with associated traction bronchiectasis. Interval decreased surrounding consolidations and ground-glass opacities. New ground-glass opacities of the left upper lobe, and to a lesser extent left lower lobe. Nonspecific small nodular and linear opacities are seen in the right middle lobe and inferior left lower lobe. Reference juxtapleural nodule of the right middle lobe measuring 1.4 x 0.6 cm on image 99. Reference linear nodular opacity of the inferior right lower lobe measuring 2.8 by 0.5 cm on series 5, image 85. Reference small solid nodule of the right lower lobe measuring 5  mm on series 5, image 92. Upper Abdomen: No acute abnormality. Musculoskeletal: Scattered areas of endplate sclerosis are favored to be degenerative. No aggressive appearing osseous lesions. IMPRESSION: 1. Right paramediastinal post treatment changes with interval decreased surrounding consolidations and ground-glass opacities, findings are compatible with resolving immunotherapy related pneumonitis. 2. Nonspecific small nodular and linear opacities are seen in the right middle and inferior right lower lobe, potentially scarring/atelectasis or sequela of aspiration. Cannot assess if findings are new when compared with prior exams since previously obscured by extensive right lung opacities. Consider short-term follow-up chest CT for further evaluation. 3. New mild ground-glass opacities of the left lung which are most pronounced in the left upper lobe, likely infectious or inflammatory. 4. Aortic Atherosclerosis (ICD10-I70.0) and Emphysema (ICD10-J43.9). Electronically Signed   By: Yetta Glassman M.D.   On: 05/12/2021 16:30     ASSESSMENT AND PLAN: This is a very pleasant 61 years old white male recently diagnosed with stage IIIb (T3, N2, M0) non-small cell lung cancer, squamous cell carcinoma presented with large right upper lobe perihilar mass with right hilar and subcarinal lymphadenopathy diagnosed in April 2022. The patient underwent a course of concurrent chemoradiation with weekly carboplatin and paclitaxel status post 8 cycles.  The patient tolerated his treatment well except for fatigue. His scan showed partial response with improvement in the right hilar mass as well as resolution of the right upper lobe lung nodule. The patient started on treatment with consolidation immunotherapy with Imfinzi 1500 Mg IV every 4 weeks status post 3 cycles.  He has been tolerating the treatment well except for the worsening cough and shortness of breath. His restaging scan after cycle #3 was concerning for  immunotherapy mediated pneumonitis his treatment was discontinued and the patient was treated with a tapered dose of prednisone completed 2 weeks ago. He had repeat CT scan of the chest performed recently.  I personally and independently reviewed the scan images and discussed the results with the patient and his wife. His scan showed significant improvement of the immunotherapy mediated pneumonitis. The patient is currently more symptomatic with persistent dry cough as well as shortness of breath.  He did not have any COVID 19 test done recently.  I recommended for the patient to have a home COVID test done to rule out this possibility. I will also refer him to Dr. Valeta Harms for evaluation and management of his persistent cough and shortness of breath that has been going on after improvement of the immunotherapy mediated pneumonitis.  The patient was also advised to go immediately to the emergency department if he has no improvement of his condition or worsening of his symptoms. For the dry cough I will give him refill of Tessalon. For the history of pulmonary embolism, the patient will continue his current treatment with Xarelto. I  will see him back for follow-up visit in 3 months for evaluation and repeat CT scan of the chest for restaging of his disease. He was advised to call immediately if he has any other concerning symptoms in the interval.  The patient voices understanding of current disease status and treatment options and is in agreement with the current care plan.  All questions were answered. The patient knows to call the clinic with any problems, questions or concerns. We can certainly see the patient much sooner if necessary.   Disclaimer: This note was dictated with voice recognition software. Similar sounding words can inadvertently be transcribed and may not be corrected upon review.

## 2021-05-20 NOTE — Telephone Encounter (Signed)
Pt requests refill for Tessalon perles. Message sent to [facility].

## 2021-05-27 ENCOUNTER — Telehealth: Payer: Self-pay | Admitting: *Deleted

## 2021-05-27 NOTE — Telephone Encounter (Signed)
Form PFL-4 (FMLA) for family member Teacher, English as a foreign language successfully faxed to [employer] [phone]) at [phone].  Original copy mailed to patient address on file: [address] [place] [address]  No further completed form delivery actions performed or further instructions received.

## 2021-06-03 ENCOUNTER — Other Ambulatory Visit: Payer: Self-pay

## 2021-06-03 ENCOUNTER — Encounter: Payer: Self-pay | Admitting: Pulmonary Disease

## 2021-06-03 ENCOUNTER — Ambulatory Visit: Payer: Managed Care, Other (non HMO) | Admitting: Pulmonary Disease

## 2021-06-03 VITALS — BP 122/80 | HR 91 | Temp 97.6°F | Ht 69.0 in | Wt 200.0 lb

## 2021-06-03 DIAGNOSIS — Z87891 Personal history of nicotine dependence: Secondary | ICD-10-CM

## 2021-06-03 DIAGNOSIS — C3491 Malignant neoplasm of unspecified part of right bronchus or lung: Secondary | ICD-10-CM

## 2021-06-03 DIAGNOSIS — J432 Centrilobular emphysema: Secondary | ICD-10-CM

## 2021-06-03 DIAGNOSIS — J189 Pneumonia, unspecified organism: Secondary | ICD-10-CM

## 2021-06-03 MED ORDER — BREZTRI AEROSPHERE 160-9-4.8 MCG/ACT IN AERO: 2.0000 | INHALATION_SPRAY | Freq: Two times a day (BID) | RESPIRATORY_TRACT | 0 refills | Status: DC

## 2021-06-03 NOTE — Addendum Note (Signed)
Addended by: Fran Lowes on: 06/03/2021 05:13 PM   Modules accepted: Orders

## 2021-06-03 NOTE — Progress Notes (Signed)
Synopsis: Referred in May 2022 for s/p bronch PCP: by [name], MD  Subjective:   PATIENT ID: [name] GENDER: male DOB: October 21, 1960, MRN: [id number]  Chief Complaint  Patient presents with   Follow-up    Patient is here because of his cough.     This is a 61 year old gentleman past medical history of hypothyroidism, works as a Chief Strategy Officer.  He longstanding history of smoking.  Quit 10 years ago.  Patient initially presented to [facility] emergency department.  Had a CT scan on 08/10/2020 with lung mass.  Patient was brought into the hospital for evaluation consultation on 09/01/2020 followed by bronchoscopy for tissue sampling.  Patient had a 4 x 6 x 2 cm right upper lobe hilar mass concerning for malignancy.  Patient ultimately underwent a nuclear medicine PET scan on 06/05/2020.  This nuclear medicine pet image revealed a perihilar right upper lobe mass with concern for postobstructive pneumonia and a 1 cm low right paratracheal and subcarinal node.  Patient had pathology reports that came back positive for malignancy.  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  Patient here today with complaints of dyspnea on exertion and shortness of breath.  CT imaging does have evidence of centrilobular emphysema.  Patient likely has a diagnosis of COPD.  He is not on any current maintenance inhalers.  OV 06/03/2021: Patient here today for follow-up.  Initially saw me for lung mass status postbiopsy undergoing treatments with medical oncology. Patient recently seen in the office by Dr. Earlie Server.  05/20/2021 office note reviewed patient has underwent concurrent chemoradiation last dose July 2022 consolidation treatment with immunotherapy first dose started August 2022.  Patient still having ongoing cough shortness of breath.  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  After cycle 3 was treated with  a tapering dose of prednisone.  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  At this time just using albuterol for management of respiratory symptoms.  Not on a maintenance inhaler.  He feels much better after completing courses of steroids.  He does feel like his cough is improved.   Past Medical History:  Diagnosis Date   History of radiation therapy 11/12/2020   right lung  09/30/2020-11/12/2020  Dr Gery Pray   Hypothyroidism    Thyroid disease      Family History  Problem Relation Age of Onset   Cancer Mother      Past Surgical History:  Procedure Laterality Date   APPENDECTOMY     BRONCHIAL BIOPSY  09/01/2020   Procedure: BRONCHIAL BIOPSIES;  Surgeon: Garner Nash, DO;  Location: [place] [facility];  Service: Pulmonary;;   BRONCHIAL BRUSHINGS  09/01/2020   Procedure: BRONCHIAL BRUSHINGS;  Surgeon: Garner Nash, DO;  Location: [place];  Service: Pulmonary;;   BRONCHIAL NEEDLE ASPIRATION BIOPSY  09/01/2020   Procedure: BRONCHIAL NEEDLE ASPIRATION BIOPSIES;  Surgeon: Garner Nash, DO;  Location: [place];  Service: Pulmonary;;   BRONCHIAL WASHINGS  09/01/2020   Procedure: BRONCHIAL WASHINGS;  Surgeon: Garner Nash, DO;  Location: [place];  Service: Pulmonary;;   HERNIA REPAIR     INCISION AND DRAINAGE ABSCESS N/A 08/25/2017   Procedure: INCISION AND DRAINAGE perineal  ABSCESS;  Surgeon: Festus Aloe, MD;  Location:  [facility];  Service: Urology;  Laterality: N/A;   VIDEO BRONCHOSCOPY WITH ENDOBRONCHIAL ULTRASOUND N/A 09/01/2020   Procedure: VIDEO BRONCHOSCOPY WITH ENDOBRONCHIAL ULTRASOUND;  Surgeon: Garner Nash, DO;  Location: [place];  Service: Pulmonary;  Laterality: N/A;    Social History   Socioeconomic History   Marital status: Single    Spouse name: Not on  file   Number of children: Not on file   Years of education: Not on file   Highest education level: Not on file  Occupational History   Not on file  Tobacco Use   Smoking status: Former    Packs/day: 2.00    Years: 30.00    Pack years: 60.00    Types: Cigarettes   Smokeless tobacco: Never  Vaping Use   Vaping Use: Never used  Substance and Sexual Activity   Alcohol use: Yes    Alcohol/week: 1.0 - 2.0 standard drink    Types: 1 - 2 Cans of beer per week    Comment: few beers everyday   Drug use: Never   Sexual activity: Not on file  Other Topics Concern   Not on file  Social History Narrative   Not on file   Social Determinants of Health   Financial Resource Strain: Low Risk    Difficulty of Paying Living Expenses: Not hard at all  Food Insecurity: No Food Insecurity   Worried About Charity fundraiser in the Last Year: Never true   [place] in the Last Year: Never true  Transportation Needs: No Transportation Needs   Lack of Transportation (Medical): No   Lack of Transportation (Non-Medical): No  Physical Activity: Not on file  Stress: No Stress Concern Present   Feeling of Stress : Not at all  Social Connections: Unknown   Frequency of Communication with Friends and Family: More than three times a week   Frequency of Social Gatherings with Friends and Family: More than three times a week   Attends Religious Services: Not on Electrical engineer or Organizations: Not on file   Attends Archivist Meetings: Not on file   Marital Status: Living with partner  Intimate Partner Violence: Not At Risk   Fear of Current or Ex-Partner: No   Emotionally Abused: No   Physically Abused: No   Sexually Abused: No     No Known Allergies   Outpatient Medications Prior to Visit  Medication Sig Dispense Refill   albuterol (VENTOLIN HFA) 108 (90 Base) MCG/ACT inhaler Inhale 2 puffs into the lungs every 6 (six) hours as needed for wheezing or shortness  of breath. 8 g 2   b complex vitamins capsule Take 1 capsule by mouth daily.     benzonatate (TESSALON PERLES) 100 MG capsule Take 1 capsule (100 mg total) by mouth 3 (three) times daily as needed for cough. 30 capsule 0   feeding supplement (ENSURE ENLIVE / ENSURE PLUS) LIQD Take 237 mLs by mouth 2 (two) times daily between meals. 237 mL 12   guaiFENesin (MUCINEX) 600 MG 12 hr tablet Take 1 tablet (600 mg total) by mouth 2 (two) times daily. 60 tablet 2   levofloxacin (LEVAQUIN) 750 MG tablet Take 750 mg by mouth daily.     levothyroxine (SYNTHROID) 112 MCG tablet Take 112 mcg by mouth in the morning.     prochlorperazine (COMPAZINE) 10 MG tablet TAKE 1 TABLET(10 MG) BY MOUTH EVERY 6 HOURS AS NEEDED FOR NAUSEA OR VOMITING  30 tablet 2   rivaroxaban (XARELTO) 20 MG TABS tablet Take 1 tablet (20 mg total) by mouth daily with supper. 30 tablet 3   vitamin C (ASCORBIC ACID) 500 MG tablet Take 500 mg by mouth daily.     No facility-administered medications prior to visit.    Review of Systems  Constitutional:  Negative for chills, fever, malaise/fatigue and weight loss.  HENT:  Negative for hearing loss, sore throat and tinnitus.   Eyes:  Negative for blurred vision and double vision.  Respiratory:  Positive for cough and shortness of breath. Negative for hemoptysis, sputum production, wheezing and stridor.   Cardiovascular:  Negative for chest pain, palpitations, orthopnea, leg swelling and PND.  Gastrointestinal:  Negative for abdominal pain, constipation, diarrhea, heartburn, nausea and vomiting.  Genitourinary:  Negative for dysuria, hematuria and urgency.  Musculoskeletal:  Negative for joint pain and myalgias.  Skin:  Negative for itching and rash.  Neurological:  Negative for dizziness, tingling, weakness and headaches.  Endo/Heme/Allergies:  Negative for environmental allergies. Does not bruise/bleed easily.  Psychiatric/Behavioral:  Negative for depression. The patient is not  nervous/anxious and does not have insomnia.   All other systems reviewed and are negative.   Objective:  Physical Exam Vitals reviewed.  Constitutional:      General: He is not in acute distress.    Appearance: He is well-developed.  HENT:     Head: Normocephalic and atraumatic.  Eyes:     General: No scleral icterus.    Conjunctiva/sclera: Conjunctivae normal.     Pupils: Pupils are equal, round, and reactive to light.  Neck:     Vascular: No JVD.     Trachea: No tracheal deviation.  Cardiovascular:     Rate and Rhythm: Normal rate and regular rhythm.     Heart sounds: Normal heart sounds. No murmur heard. Pulmonary:     Effort: Pulmonary effort is normal. No tachypnea, accessory muscle usage or respiratory distress.     Breath sounds: No stridor. No wheezing, rhonchi or rales.  Abdominal:     General: There is no distension.     Palpations: Abdomen is soft.     Tenderness: There is no abdominal tenderness.  Musculoskeletal:        General: No tenderness.     Cervical back: Neck supple.  Lymphadenopathy:     Cervical: No cervical adenopathy.  Skin:    General: Skin is warm and dry.     Capillary Refill: Capillary refill takes less than 2 seconds.     Findings: No rash.  Neurological:     Mental Status: He is alert and oriented to person, place, and time.  Psychiatric:        Behavior: Behavior normal.     Vitals:   06/03/21 1515  BP: 122/80  Pulse: 91  Temp: 97.6 F (36.4 C)  TempSrc: Oral  SpO2: 97%  Weight: 200 lb (90.7 kg)  Height: 5\' 9"  (1.753 m)    97% on RA BMI Readings from Last 3 Encounters:  06/03/21 29.53 kg/m  05/20/21 30.68 kg/m  03/30/21 31.12 kg/m   Wt Readings from Last 3 Encounters:  06/03/21 200 lb (90.7 kg)  05/20/21 201 lb 12.8 oz (91.5 kg)  03/30/21 204 lb 11.2 oz (92.9 kg)     CBC    Component Value Date/Time   WBC 6.1 05/12/2021 1026   WBC 4.7 02/27/2021 0536   RBC 4.71 05/12/2021 1026   HGB 13.6 05/12/2021 1026  HCT 40.9 05/12/2021 1026   PLT 204 05/12/2021 1026   MCV 86.8 05/12/2021 1026   MCH 28.9 05/12/2021 1026   MCHC 33.3 05/12/2021 1026   RDW 17.6 (H) 05/12/2021 1026   LYMPHSABS 1.1 05/12/2021 1026   MONOABS 1.0 05/12/2021 1026   EOSABS 0.3 05/12/2021 1026   BASOSABS 0.0 05/12/2021 1026    Chest Imaging:  CT chest 08/10/2020: 6 cm right upper lobe hilar lesion concerning for bronchogenic carcinoma with associated adenopathy. The patient's images have been independently reviewed by me.    09/02/2020 nuclear medicine pet imaging: PET avid right upper lobe hilar lesion concerning for an advanced age bronchogenic carcinoma with associated adenopathy. The patient's images have been independently reviewed by me.    January 2023: CT chest: Areas of groundglass in the upper lobe still persistent on the left side.  Scarring from radiation on the right. Overall CT much improved from his previous imaging back in the fall when he had significant pneumonitis. The patient's images have been independently reviewed by me.    Pulmonary Functions Testing Results: No flowsheet data found.  FeNO:   Pathology:   Echocardiogram:   Heart Catheterization:     Assessment & Plan:     ICD-10-CM   1. 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 Centrilobular emphysema ([place])  J43.2     3. Former smoker  Z87.891     4. Pneumonitis  J18.9     5. Drug-induced pneumonitis  J18.9    T50.905A        Discussion:  This is a 62 year old gentleman, recent diagnosis of stage III squamous cell carcinoma of the lung completed chemoradiation and consolidative therapy with Imfinzi.  After the third cycle of immune therapy developed pneumonitis.  He is recovered from this his CT scan has improved.  Was treated with steroids by medical oncology.  He likely has COPD not on any maintenance inhalers at this time has ongoing cough shortness of breath.  Plan: Start Breztri Spacer with  [employer] Prescription co-pay card with [employer] back and see [place] in 6 weeks see how he is doing with new inhaler regimen. Continue albuterol for shortness of breath and wheezing.   Current Outpatient Medications:    albuterol (VENTOLIN HFA) 108 (90 Base) MCG/ACT inhaler, Inhale 2 puffs into the lungs every 6 (six) hours as needed for wheezing or shortness of breath., Disp: 8 g, Rfl: 2   b complex vitamins capsule, Take 1 capsule by mouth daily., Disp: , Rfl:    benzonatate (TESSALON PERLES) 100 MG capsule, Take 1 capsule (100 mg total) by mouth 3 (three) times daily as needed for cough., Disp: 30 capsule, Rfl: 0   feeding supplement (ENSURE ENLIVE / ENSURE PLUS) LIQD, Take 237 mLs by mouth 2 (two) times daily between meals., Disp: 237 mL, Rfl: 12   guaiFENesin (MUCINEX) 600 MG 12 hr tablet, Take 1 tablet (600 mg total) by mouth 2 (two) times daily., Disp: 60 tablet, Rfl: 2   levofloxacin (LEVAQUIN) 750 MG tablet, Take 750 mg by mouth daily., Disp: , Rfl:    levothyroxine (SYNTHROID) 112 MCG tablet, Take 112 mcg by mouth in the morning., Disp: , Rfl:    prochlorperazine (COMPAZINE) 10 MG tablet, TAKE 1 TABLET(10 MG) BY MOUTH EVERY 6 HOURS AS NEEDED FOR NAUSEA OR VOMITING, Disp: 30 tablet, Rfl: 2   rivaroxaban (XARELTO) 20 MG TABS tablet, Take 1 tablet (20 mg total) by mouth  daily with supper., Disp: 30 tablet, Rfl: 3   vitamin C (ASCORBIC ACID) 500 MG tablet, Take 500 mg by mouth daily., Disp: , Rfl:    Garner Nash, DO [facility] 06/03/2021 3:31 PM

## 2021-06-03 NOTE — Patient Instructions (Signed)
Thank you for visiting Dr. Valeta Harms at [facility]. Today we recommend the following:  Breztri samples  New prescription for breztri, 2 puffs twice per day  Spacer  Coupon card  Return in about 6 weeks (around 07/15/2021) for with Eric Form, NP, or Dr. Valeta Harms.    Please do your part to reduce the spread of COVID-19.

## 2021-06-08 ENCOUNTER — Telehealth: Payer: Self-pay | Admitting: Medical Oncology

## 2021-06-08 ENCOUNTER — Other Ambulatory Visit: Payer: Self-pay | Admitting: Internal Medicine

## 2021-06-08 MED ORDER — HYDROCODONE BIT-HOMATROP MBR 5-1.5 MG/5ML PO SOLN: 5.0000 mL | Freq: Four times a day (QID) | ORAL | 0 refills | Status: DC | PRN

## 2021-06-08 NOTE — Telephone Encounter (Signed)
Coughing persists -pt requesting refill hydromet.  Last rx was 02/08/21 by Dr. Julien Nordmann.( Confirmed with pharmacy)  [place] Please send rx to [facility]  # [id number] [place] [address] and [place] [address] .

## 2021-07-15 ENCOUNTER — Ambulatory Visit: Payer: Managed Care, Other (non HMO) | Admitting: Pulmonary Disease

## 2021-07-17 ENCOUNTER — Other Ambulatory Visit: Payer: Self-pay | Admitting: Internal Medicine

## 2021-08-16 ENCOUNTER — Other Ambulatory Visit: Payer: Self-pay

## 2021-08-16 ENCOUNTER — Inpatient Hospital Stay: Payer: Managed Care, Other (non HMO) | Attending: Internal Medicine

## 2021-08-16 ENCOUNTER — Ambulatory Visit (HOSPITAL_COMMUNITY)
Admission: RE | Admit: 2021-08-16 | Discharge: 2021-08-16 | Disposition: A | Discharge status: Home / Self Care | Payer: Managed Care, Other (non HMO) | Source: Ambulatory Visit | Attending: Internal Medicine | Admitting: Internal Medicine

## 2021-08-16 ENCOUNTER — Other Ambulatory Visit: Payer: Managed Care, Other (non HMO)

## 2021-08-16 DIAGNOSIS — Z923 Personal history of irradiation: Secondary | ICD-10-CM | POA: Insufficient documentation

## 2021-08-16 DIAGNOSIS — C3411 Malignant neoplasm of upper lobe, right bronchus or lung: Secondary | ICD-10-CM | POA: Insufficient documentation

## 2021-08-16 DIAGNOSIS — C3491 Malignant neoplasm of unspecified part of right bronchus or lung: Secondary | ICD-10-CM

## 2021-08-16 DIAGNOSIS — C3490 Malignant neoplasm of unspecified part of unspecified bronchus or lung: Secondary | ICD-10-CM | POA: Insufficient documentation

## 2021-08-16 DIAGNOSIS — Z9221 Personal history of antineoplastic chemotherapy: Secondary | ICD-10-CM | POA: Insufficient documentation

## 2021-08-16 DIAGNOSIS — Z79899 Other long term (current) drug therapy: Secondary | ICD-10-CM | POA: Insufficient documentation

## 2021-08-16 DIAGNOSIS — Z7901 Long term (current) use of anticoagulants: Secondary | ICD-10-CM | POA: Insufficient documentation

## 2021-08-16 DIAGNOSIS — Z86711 Personal history of pulmonary embolism: Secondary | ICD-10-CM | POA: Insufficient documentation

## 2021-08-16 DIAGNOSIS — Z7952 Long term (current) use of systemic steroids: Secondary | ICD-10-CM | POA: Insufficient documentation

## 2021-08-16 DIAGNOSIS — Z86718 Personal history of other venous thrombosis and embolism: Secondary | ICD-10-CM | POA: Insufficient documentation

## 2021-08-16 LAB — CMP (CANCER CENTER ONLY)
ALT: 46 U/L — ABNORMAL HIGH (ref 0–44)
AST: 31 U/L (ref 15–41)
Albumin: 3.9 g/dL (ref 3.5–5.0)
Alkaline Phosphatase: 79 U/L (ref 38–126)
Anion gap: 8 (ref 5–15)
BUN: 17 mg/dL (ref 6–20)
CO2: 25 mmol/L (ref 22–32)
Calcium: 9.6 mg/dL (ref 8.9–10.3)
Chloride: 103 mmol/L (ref 98–111)
Creatinine: 0.8 mg/dL (ref 0.61–1.24)
GFR, Estimated: >60 mL/min (ref >60)
Glucose, Bld: 104 mg/dL — ABNORMAL HIGH (ref 70–99)
Potassium: 3.9 mmol/L (ref 3.5–5.1)
Sodium: 136 mmol/L (ref 135–145)
Total Bilirubin: 0.7 mg/dL (ref 0.3–1.2)
Total Protein: 7.9 g/dL (ref 6.5–8.1)

## 2021-08-16 LAB — CBC WITH DIFFERENTIAL (CANCER CENTER ONLY)
Abs Immature Granulocytes: 0.02 10*3/uL (ref 0.00–0.07)
Basophils Absolute: 0 10*3/uL (ref 0.0–0.1)
Basophils Relative: 0 %
Eosinophils Absolute: 0.2 10*3/uL (ref 0.0–0.5)
Eosinophils Relative: 3 %
HCT: 45.4 % (ref 39.0–52.0)
Hemoglobin: 15.6 g/dL (ref 13.0–17.0)
Immature Granulocytes: 0 %
Lymphocytes Relative: 21 %
Lymphs Abs: 1.2 10*3/uL (ref 0.7–4.0)
MCH: 28.9 pg (ref 26.0–34.0)
MCHC: 34.4 g/dL (ref 30.0–36.0)
MCV: 84.1 fL (ref 80.0–100.0)
Monocytes Absolute: 0.7 10*3/uL (ref 0.1–1.0)
Monocytes Relative: 12 %
Neutro Abs: 3.6 10*3/uL (ref 1.7–7.7)
Neutrophils Relative %: 64 %
Platelet Count: 210 10*3/uL (ref 150–400)
RBC: 5.4 MIL/uL (ref 4.22–5.81)
RDW: 13.7 % (ref 11.5–15.5)
WBC Count: 5.8 10*3/uL (ref 4.0–10.5)
nRBC: 0 % (ref 0.0–0.2)

## 2021-08-16 LAB — TSH: TSH: 1.059 u[IU]/mL (ref 0.320–4.118)

## 2021-08-16 MED ORDER — IOHEXOL 300 MG/ML  SOLN
75.0000 mL | Freq: Once | INTRAMUSCULAR | Status: AC | PRN
  Administered 2021-08-16: 75 mL via INTRAVENOUS

## 2021-08-16 MED ORDER — SODIUM CHLORIDE (PF) 0.9 % IJ SOLN
INTRAMUSCULAR | Status: AC
  Filled 2021-08-16: qty 50

## 2021-08-18 ENCOUNTER — Inpatient Hospital Stay: Payer: Managed Care, Other (non HMO) | Admitting: Internal Medicine

## 2021-08-18 ENCOUNTER — Other Ambulatory Visit: Payer: Self-pay

## 2021-08-18 VITALS — BP 132/97 | HR 89 | Temp 96.3°F | Resp 18 | Ht 69.0 in | Wt 211.9 lb

## 2021-08-18 DIAGNOSIS — Z86711 Personal history of pulmonary embolism: Secondary | ICD-10-CM | POA: Diagnosis not present

## 2021-08-18 DIAGNOSIS — C3491 Malignant neoplasm of unspecified part of right bronchus or lung: Secondary | ICD-10-CM

## 2021-08-18 DIAGNOSIS — Z7952 Long term (current) use of systemic steroids: Secondary | ICD-10-CM | POA: Diagnosis not present

## 2021-08-18 DIAGNOSIS — Z7901 Long term (current) use of anticoagulants: Secondary | ICD-10-CM | POA: Diagnosis not present

## 2021-08-18 DIAGNOSIS — C3490 Malignant neoplasm of unspecified part of unspecified bronchus or lung: Secondary | ICD-10-CM | POA: Diagnosis not present

## 2021-08-18 DIAGNOSIS — Z86718 Personal history of other venous thrombosis and embolism: Secondary | ICD-10-CM | POA: Diagnosis not present

## 2021-08-18 DIAGNOSIS — Z923 Personal history of irradiation: Secondary | ICD-10-CM | POA: Diagnosis not present

## 2021-08-18 DIAGNOSIS — Z9221 Personal history of antineoplastic chemotherapy: Secondary | ICD-10-CM | POA: Diagnosis not present

## 2021-08-18 DIAGNOSIS — C3411 Malignant neoplasm of upper lobe, right bronchus or lung: Secondary | ICD-10-CM | POA: Diagnosis present

## 2021-08-18 DIAGNOSIS — Z79899 Other long term (current) drug therapy: Secondary | ICD-10-CM | POA: Diagnosis not present

## 2021-08-18 NOTE — Progress Notes (Signed)
?    [place] ?[phone]   [phone] ? ?OFFICE PROGRESS NOTE ? ?[name], MD ?[address] ?[place] [address] ? ?DIAGNOSIS:  ?1) Stage IIIb (T3, N2, M0) non-small cell lung cancer, squamous cell carcinoma presented with large right upper lobe perihilar mass with right hilar and subcarinal lymphadenopathy diagnosed in April 2022. ?2) left lower extremity deep venous thrombosis diagnosed in October 2022.  Currently on treatment with Xarelto. ? ?PRIOR THERAPY:  ?1) Concurrent chemoradiation with weekly carboplatin for AUC of 2 and paclitaxel 45 Mg/M2.  Status post 8 cycles.  Last dose was given November 10, 2020. ?2) Consolidation treatment with immunotherapy with Imfinzi 1500 Mg IV every 4 weeks.  First dose December 14, 2020.  Status post 3 cycles.  This was discontinued secondary to intolerance with highly suspicious immunotherapy mediated pneumonitis. ? ?CURRENT THERAPY: Observation ? ?INTERVAL HISTORY: ?Paul Mclean 61 y.o. male returns to the clinic today for follow-up visit accompanied by his wife.  The patient is feeling fine today with no concerning complaints.  He denied having any current chest pain, shortness of breath, cough or hemoptysis.  He has no nausea, vomiting, diarrhea or constipation.  He has no headache or visual changes.  He gained around 12 pounds since his last visit.  He denied having any fever or chills.  He is here today for evaluation with repeat CT scan of the chest for restaging of his disease. ? ?MEDICAL HISTORY: ?Past Medical History:  ?Diagnosis Date  ? History of radiation therapy 11/12/2020  ? right lung  09/30/2020-11/12/2020  Dr Gery Pray  ? Hypothyroidism   ? Thyroid disease   ? ? ?ALLERGIES:  has No Known Allergies. ? ?MEDICATIONS:  ?Current Outpatient Medications  ?Medication Sig Dispense Refill  ? albuterol (VENTOLIN HFA) 108 (90 Base) MCG/ACT inhaler Inhale 2 puffs into the lungs every 6 (six) hours as needed for wheezing or  shortness of breath. 8 g 2  ? b complex vitamins capsule Take 1 capsule by mouth daily.    ? benzonatate (TESSALON PERLES) 100 MG capsule Take 1 capsule (100 mg total) by mouth 3 (three) times daily as needed for cough. 30 capsule 0  ? Budeson-Glycopyrrol-Formoterol (BREZTRI AEROSPHERE) 160-9-4.8 MCG/ACT AERO Inhale 2 puffs into the lungs in the morning and at bedtime. 10.7 g 0  ? feeding supplement (ENSURE ENLIVE / ENSURE PLUS) LIQD Take 237 mLs by mouth 2 (two) times daily between meals. 237 mL 12  ? guaiFENesin (MUCINEX) 600 MG 12 hr tablet Take 1 tablet (600 mg total) by mouth 2 (two) times daily. 60 tablet 2  ? HYDROcodone bit-homatropine (HYDROMET) 5-1.5 MG/5ML syrup Take 5 mLs by mouth every 6 (six) hours as needed for cough. 120 mL 0  ? levothyroxine (SYNTHROID) 112 MCG tablet Take 112 mcg by mouth in the morning.    ? prochlorperazine (COMPAZINE) 10 MG tablet TAKE 1 TABLET(10 MG) BY MOUTH EVERY 6 HOURS AS NEEDED FOR NAUSEA OR VOMITING 30 tablet 2  ? vitamin C (ASCORBIC ACID) 500 MG tablet Take 500 mg by mouth daily.    ? XARELTO 20 MG TABS tablet TAKE 1 TABLET(20 MG) BY MOUTH DAILY WITH SUPPER 30 tablet 3  ? ?No current facility-administered medications for this visit.  ? ? ?SURGICAL HISTORY:  ?Past Surgical History:  ?Procedure Laterality Date  ? APPENDECTOMY    ? BRONCHIAL BIOPSY  09/01/2020  ? Procedure: BRONCHIAL BIOPSIES;  Surgeon: Garner Nash, DO;  Location: [place]  [facility];  Service: Pulmonary;;  ? BRONCHIAL BRUSHINGS  09/01/2020  ? Procedure: BRONCHIAL BRUSHINGS;  Surgeon: Garner Nash, DO;  Location: [place];  Service: Pulmonary;;  ? BRONCHIAL NEEDLE ASPIRATION BIOPSY  09/01/2020  ? Procedure: BRONCHIAL NEEDLE ASPIRATION BIOPSIES;  Surgeon: Garner Nash, DO;  Location: [place];  Service: Pulmonary;;  ? BRONCHIAL WASHINGS  09/01/2020  ? Procedure: BRONCHIAL WASHINGS;  Surgeon: Garner Nash, DO;  Location: [place] [facility];  Service: Pulmonary;;  ? HERNIA REPAIR    ? INCISION AND  DRAINAGE ABSCESS N/A 08/25/2017  ? Procedure: INCISION AND DRAINAGE perineal  ABSCESS;  Surgeon: Festus Aloe, MD;  Location: [facility];  Service: Urology;  Laterality: N/A;  ? VIDEO BRONCHOSCOPY WITH ENDOBRONCHIAL ULTRASOUND N/A 09/01/2020  ? Procedure: VIDEO BRONCHOSCOPY WITH ENDOBRONCHIAL ULTRASOUND;  Surgeon: Garner Nash, DO;  Location: [place];  Service: Pulmonary;  Laterality: N/A;  ? ? ?REVIEW OF SYSTEMS:  A comprehensive review of systems was negative.  ? ?PHYSICAL EXAMINATION: General appearance: alert, cooperative, and no distress ?Head: Normocephalic, without obvious abnormality, atraumatic ?Neck: no adenopathy, no JVD, supple, symmetrical, trachea midline, and thyroid not enlarged, symmetric, no tenderness/mass/nodules ?Lymph nodes: Cervical, supraclavicular, and axillary nodes normal. ?Resp: clear to auscultation bilaterally ?Back: symmetric, no curvature. ROM normal. No CVA tenderness. ?Cardio: regular rate and rhythm, S1, S2 normal, no murmur, click, rub or gallop ?GI: soft, non-tender; bowel sounds normal; no masses,  no organomegaly ?Extremities: extremities normal, atraumatic, no cyanosis or edema ? ?ECOG PERFORMANCE STATUS: 1 - Symptomatic but completely ambulatory ? ?Blood pressure (!) 132/97, pulse 89, temperature (!) 96.3 ?F (35.7 ?C), temperature source Tympanic, resp. rate 18, height 5\' 9"  (1.753 m), weight 211 lb 14.4 oz (96.1 kg), SpO2 97 %. ? ?LABORATORY DATA: ?Lab Results  ?Component Value Date  ? WBC 5.8 08/16/2021  ? HGB 15.6 08/16/2021  ? HCT 45.4 08/16/2021  ? MCV 84.1 08/16/2021  ? PLT 210 08/16/2021  ? ? ?  Chemistry   ?   ?Component Value Date/Time  ? NA 136 08/16/2021 1356  ? K 3.9 08/16/2021 1356  ? CL 103 08/16/2021 1356  ? CO2 25 08/16/2021 1356  ? BUN 17 08/16/2021 1356  ? CREATININE 0.80 08/16/2021 1356  ?    ?Component Value Date/Time  ? CALCIUM 9.6 08/16/2021 1356  ? ALKPHOS 79 08/16/2021 1356  ? AST 31 08/16/2021 1356  ? ALT 46 (H) 08/16/2021 1356  ? BILITOT 0.7  08/16/2021 1356  ?  ? ? ? ?RADIOGRAPHIC STUDIES: ?CT Chest W Contrast ? ?Result Date: 08/17/2021 ?CLINICAL DATA:  Restaging non-small cell lung cancer. * Tracking Code: BO * EXAM: CT CHEST WITH CONTRAST TECHNIQUE: Multidetector CT imaging of the chest was performed during intravenous contrast administration. RADIATION DOSE REDUCTION: This exam was performed according to the departmental dose-optimization program which includes automated exposure control, adjustment of the mA and/or kV according to patient size and/or use of iterative reconstruction technique. CONTRAST:  39mL OMNIPAQUE IOHEXOL 300 MG/ML  SOLN COMPARISON:  Chest CT 05/12/2021 and 03/05/2021.  PET-CT 09/02/2020. FINDINGS: Cardiovascular: There are no significant vascular findings. The heart size is normal. There is no pericardial effusion. Mediastinum/Nodes: There are no enlarged mediastinal, hilar or axillary lymph nodes. The thyroid gland, trachea and esophagus demonstrate no significant findings. Lungs/Pleura: No pleural effusion or pneumothorax. Stable underlying mild centrilobular and paraseptal emphysema. There are stable radiation changes in the right perihilar region with perihilar opacities, architectural distortion and traction bronchiectasis. The superimposed patchy ground-glass opacities and ill-defined nodularity  on the prior study have resolved. No evidence of local recurrence or suspicious nodularity. Upper abdomen: Diffuse heterogeneity of the hepatic parenchyma appears similar to previous studies and may reflect heterogeneous steatosis and/or regenerating nodules. There was no associated hepatic hypermetabolic activity on prior PET-CT. Musculoskeletal/Chest wall: There is no chest wall mass or suspicious osseous finding. Stable scattered endplate sclerosis throughout the thoracic spine. IMPRESSION: 1. Interval resolution of patchy ground-glass opacities in both lungs consistent with resolution of immunotherapy related pneumonitis. 2.  Otherwise stable radiation changes in the right perihilar region. 3. No evidence of local recurrence or metastatic disease. 4.  Emphysema (ICD10-J43.9). Electronically Signed   By: Richardean Sale M.D.   On:

## 2021-08-23 ENCOUNTER — Ambulatory Visit: Payer: Managed Care, Other (non HMO) | Admitting: Pulmonary Disease

## 2021-08-23 ENCOUNTER — Encounter: Payer: Self-pay | Admitting: Pulmonary Disease

## 2021-08-23 VITALS — BP 118/74 | HR 80 | Temp 97.9°F | Ht 69.0 in | Wt 210.2 lb

## 2021-08-23 DIAGNOSIS — J984 Other disorders of lung: Secondary | ICD-10-CM

## 2021-08-23 DIAGNOSIS — J701 Chronic and other pulmonary manifestations due to radiation: Secondary | ICD-10-CM

## 2021-08-23 DIAGNOSIS — J189 Pneumonia, unspecified organism: Secondary | ICD-10-CM | POA: Diagnosis not present

## 2021-08-23 DIAGNOSIS — C3491 Malignant neoplasm of unspecified part of right bronchus or lung: Secondary | ICD-10-CM | POA: Diagnosis not present

## 2021-08-23 DIAGNOSIS — J479 Bronchiectasis, uncomplicated: Secondary | ICD-10-CM

## 2021-08-23 MED ORDER — BREZTRI AEROSPHERE 160-9-4.8 MCG/ACT IN AERO: 2.0000 | INHALATION_SPRAY | Freq: Two times a day (BID) | RESPIRATORY_TRACT | 0 refills | Status: DC

## 2021-08-23 MED ORDER — BREZTRI AEROSPHERE 160-9-4.8 MCG/ACT IN AERO: 2.0000 | INHALATION_SPRAY | Freq: Two times a day (BID) | RESPIRATORY_TRACT | 11 refills | Status: DC

## 2021-08-23 NOTE — Addendum Note (Signed)
Addended by: Lorretta Harp on: 08/23/2021 02:31 PM ? ? Modules accepted: Orders ? ?

## 2021-08-23 NOTE — Patient Instructions (Signed)
Thank you for visiting Dr. Valeta Harms at [facility]. ?Today we recommend the following: ? ?Breztri inhaler refills and coupon  ? ?Return in about 1 year (around 08/24/2022) for with Eric Form, NP, or Dr. Valeta Harms. ? ? ? ?Please do your part to reduce the spread of COVID-19.  ? ?

## 2021-08-23 NOTE — Progress Notes (Signed)
? ?Synopsis: Referred in May 2022 for s/p bronch PCP: by [name], MD ? ?Subjective:  ? ?PATIENT ID: Paul Paul GENDER: male DOB: 04-02-1961, MRN: [id number] ? ?Chief Complaint  ?Patient presents with  ? Follow-up  ?  Pt states he has been doing okay since last visit and denies any complaints.  ? ? ?This is a 61 year old gentleman past medical history of hypothyroidism, works as a Chief Strategy Officer.  He longstanding history of smoking.  Quit 10 years ago.  Patient initially presented to [facility] emergency department.  Had a CT scan on 08/10/2020 with lung mass.  Patient was brought into the hospital for evaluation consultation on 09/01/2020 followed by bronchoscopy for tissue sampling.  Patient had a 4 x 6 x 2 cm right upper lobe hilar mass concerning for malignancy.  Patient ultimately underwent a nuclear medicine PET scan on 06/05/2020.  This nuclear medicine pet image revealed a perihilar right upper lobe mass with concern for postobstructive pneumonia and a 1 cm low right paratracheal and subcarinal node.  Patient had pathology reports that came back positive for malignancy.  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 ? ?Patient here today with complaints of dyspnea on exertion and shortness of breath.  CT imaging does have evidence of centrilobular emphysema.  Patient likely has a diagnosis of COPD.  He is not on any current maintenance inhalers. ? ?OV 06/03/2021: Patient here today for follow-up.  Initially saw me for lung mass status postbiopsy undergoing treatments with medical oncology. Patient recently seen in the office by Dr. Earlie Server.  05/20/2021 office note reviewed patient has underwent concurrent chemoradiation last dose July 2022 consolidation treatment with immunotherapy first dose started August 2022.  Patient still having ongoing cough shortness of breath.  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  After cycle 3 was treated with a tapering dose of prednisone.  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  At this time just using albuterol for management of respiratory symptoms.  Not on a maintenance inhaler.  He feels much better after completing courses of steroids.  He does feel like his cough is improved. ? ?OV 08/23/2021: Here today for follow-up.  He has been doing really well after completing his treatments.  He is using his Breztri inhaler.  Using his spacer.  Rarely using his albuterol.  Still has constant cough sputum production.  He is able to keep his airways clear most of the time. ? ? ?Past Medical History:  ?Diagnosis Date  ? History of radiation therapy 11/12/2020  ? right lung  09/30/2020-11/12/2020  Dr Gery Pray  ? Hypothyroidism   ? Thyroid disease   ?  ? ?Family History  ?Problem Relation Age of Onset  ? Cancer Mother   ?  ? ?Past Surgical History:  ?Procedure Laterality Date  ? APPENDECTOMY    ? BRONCHIAL BIOPSY  09/01/2020  ? Procedure: BRONCHIAL BIOPSIES;  Surgeon: Garner Nash, DO;  Location: [facility];  Service: Pulmonary;;  ? BRONCHIAL BRUSHINGS  09/01/2020  ? Procedure: BRONCHIAL BRUSHINGS;  Surgeon: Garner Nash, DO;  Location: [facility];  Service: Pulmonary;;  ? BRONCHIAL NEEDLE ASPIRATION BIOPSY  09/01/2020  ? Procedure: BRONCHIAL NEEDLE ASPIRATION BIOPSIES;  Surgeon: Garner Nash, DO;  Location: [facility];  Service: Pulmonary;;  ? BRONCHIAL WASHINGS  09/01/2020  ? Procedure: BRONCHIAL WASHINGS;  Surgeon: Garner Nash, DO;  Location: [facility];  Service: Pulmonary;;  ? HERNIA REPAIR    ? INCISION AND DRAINAGE ABSCESS N/A 08/25/2017  ? Procedure: INCISION AND DRAINAGE perineal  ABSCESS;  Surgeon: Festus Aloe, MD;  Location: [facility];  Service: Urology;   Laterality: N/A;  ? VIDEO BRONCHOSCOPY WITH ENDOBRONCHIAL ULTRASOUND N/A 09/01/2020  ? Procedure: VIDEO BRONCHOSCOPY WITH ENDOBRONCHIAL ULTRASOUND;  Surgeon: Garner Nash, DO;  Location: [facility];  Service: Pulmonary;  Laterality: N/A;  ? ? ?Social History  ? ?Socioeconomic History  ? Marital status: Single  ?  Spouse name: Not on file  ? Number of children: Not on file  ? Years of education: Not on file  ? Highest education level: Not on file  ?Occupational History  ? Not on file  ?Tobacco Use  ? Smoking status: Former  ?  Packs/day: 2.00  ?  Years: 30.00  ?  Pack years: 60.00  ?  Types: Cigarettes  ?  Quit date: 2008  ?  Years since quitting: 15.3  ? Smokeless tobacco: Never  ?Vaping Use  ? Vaping Use: Never used  ?Substance and Sexual Activity  ? Alcohol use: Yes  ?  Alcohol/week: 1.0 - 2.0 standard drink  ?  Types: 1 - 2 Cans of beer per week  ?  Comment: few beers everyday  ? Drug use: Never  ? Sexual activity: Not on file  ?Other Topics Concern  ? Not on file  ?Social History Narrative  ? Not on file  ? ?Social Determinants of Health  ? ?Financial Resource Strain: Low Risk   ? Difficulty of Paying Living Expenses: Not hard at all  ?Food Insecurity: No Food Insecurity  ? Worried About Charity fundraiser in the Last Year: Never true  ? Ran Out of Food in the Last Year: Never true  ?Transportation Needs: No Transportation Needs  ? Lack of Transportation (Medical): No  ? Lack of Transportation (Non-Medical): No  ?Physical Activity: Not on file  ?Stress: No Stress Concern Present  ? Feeling of Stress : Not at all  ?Social Connections: Unknown  ? Frequency of Communication with Friends and Family: More than three times a week  ? Frequency of Social Gatherings with Friends and Family: More than three times a week  ? Attends Religious Services: Not on file  ? Active Member of Clubs or Organizations: Not on file  ? Attends Archivist Meetings: Not on file  ? Marital Status: Living with partner   ?Intimate Partner Violence: Not At Risk  ? Fear of Current or Ex-Partner: No  ? Emotionally Abused: No  ? Physically Abused: No  ? Sexually Abused: No  ?  ? ?No Known Allergies  ? ?Outpatient Medications Prior to Visit  ?Medication Sig Dispense Refill  ? albuterol (VENTOLIN HFA) 108 (90 Base) MCG/ACT inhaler Inhale 2 puffs into the lungs every 6 (six) hours as needed for wheezing or shortness of breath. 8 g 2  ? levothyroxine (SYNTHROID) 112 MCG tablet Take 112 mcg by mouth in the morning.    ? XARELTO 20 MG TABS tablet TAKE 1 TABLET(20 MG) BY MOUTH DAILY WITH SUPPER 30 tablet 3  ? Budeson-Glycopyrrol-Formoterol (BREZTRI AEROSPHERE) 160-9-4.8 MCG/ACT AERO Inhale 2 puffs into the lungs in the morning and at bedtime. 10.7 g 0  ? b complex vitamins capsule Take 1 capsule by mouth  daily. (Patient not taking: Reported on 08/18/2021)    ? benzonatate (TESSALON PERLES) 100 MG capsule Take 1 capsule (100 mg total) by mouth 3 (three) times daily as needed for cough. (Patient not taking: Reported on 08/18/2021) 30 capsule 0  ? feeding supplement (ENSURE ENLIVE / ENSURE PLUS) LIQD Take 237 mLs by mouth 2 (two) times daily between meals. (Patient not taking: Reported on 08/18/2021) 237 mL 12  ? guaiFENesin (MUCINEX) 600 MG 12 hr tablet Take 1 tablet (600 mg total) by mouth 2 (two) times daily. (Patient not taking: Reported on 08/18/2021) 60 tablet 2  ? HYDROcodone bit-homatropine (HYDROMET) 5-1.5 MG/5ML syrup Take 5 mLs by mouth every 6 (six) hours as needed for cough. (Patient not taking: Reported on 08/18/2021) 120 mL 0  ? prochlorperazine (COMPAZINE) 10 MG tablet TAKE 1 TABLET(10 MG) BY MOUTH EVERY 6 HOURS AS NEEDED FOR NAUSEA OR VOMITING (Patient not taking: Reported on 08/18/2021) 30 tablet 2  ? vitamin C (ASCORBIC ACID) 500 MG tablet Take 500 mg by mouth daily. (Patient not taking: Reported on 08/18/2021)    ? ?No facility-administered medications prior to visit.  ? ? ?Review of Systems  ?Constitutional:  Negative for chills,  fever, malaise/fatigue and weight loss.  ?HENT:  Negative for hearing loss, sore throat and tinnitus.   ?Eyes:  Negative for blurred vision and double vision.  ?Respiratory:  Positive for cough and shortness of breath. Ne

## 2021-11-18 ENCOUNTER — Ambulatory Visit (HOSPITAL_COMMUNITY)
Admission: RE | Admit: 2021-11-18 | Discharge: 2021-11-18 | Disposition: A | Discharge status: Home / Self Care | Payer: Managed Care, Other (non HMO) | Source: Ambulatory Visit | Attending: Internal Medicine | Admitting: Internal Medicine

## 2021-11-18 ENCOUNTER — Other Ambulatory Visit (HOSPITAL_COMMUNITY): Payer: Self-pay | Admitting: Internal Medicine

## 2021-11-18 DIAGNOSIS — R059 Cough, unspecified: Secondary | ICD-10-CM | POA: Insufficient documentation

## 2021-12-13 ENCOUNTER — Ambulatory Visit (HOSPITAL_COMMUNITY)
Admission: RE | Admit: 2021-12-13 | Discharge: 2021-12-13 | Disposition: A | Discharge status: Home / Self Care | Payer: Managed Care, Other (non HMO) | Source: Ambulatory Visit | Attending: Internal Medicine | Admitting: Internal Medicine

## 2021-12-13 DIAGNOSIS — C3490 Malignant neoplasm of unspecified part of unspecified bronchus or lung: Secondary | ICD-10-CM | POA: Insufficient documentation

## 2021-12-13 MED ORDER — IOHEXOL 300 MG/ML  SOLN
75.0000 mL | Freq: Once | INTRAMUSCULAR | Status: AC | PRN
  Administered 2021-12-13: 75 mL via INTRAVENOUS

## 2021-12-20 ENCOUNTER — Other Ambulatory Visit: Payer: Self-pay

## 2021-12-20 ENCOUNTER — Encounter: Payer: Self-pay | Admitting: Internal Medicine

## 2021-12-20 ENCOUNTER — Inpatient Hospital Stay: Payer: Managed Care, Other (non HMO) | Attending: Internal Medicine | Admitting: Internal Medicine

## 2021-12-20 VITALS — BP 151/101 | HR 93 | Temp 97.6°F | Resp 16 | Ht 69.0 in | Wt 208.9 lb

## 2021-12-20 DIAGNOSIS — Z923 Personal history of irradiation: Secondary | ICD-10-CM | POA: Insufficient documentation

## 2021-12-20 DIAGNOSIS — Z9221 Personal history of antineoplastic chemotherapy: Secondary | ICD-10-CM | POA: Diagnosis not present

## 2021-12-20 DIAGNOSIS — Z7952 Long term (current) use of systemic steroids: Secondary | ICD-10-CM | POA: Diagnosis not present

## 2021-12-20 DIAGNOSIS — Z79899 Other long term (current) drug therapy: Secondary | ICD-10-CM | POA: Diagnosis not present

## 2021-12-20 DIAGNOSIS — Z86718 Personal history of other venous thrombosis and embolism: Secondary | ICD-10-CM | POA: Insufficient documentation

## 2021-12-20 DIAGNOSIS — C3411 Malignant neoplasm of upper lobe, right bronchus or lung: Secondary | ICD-10-CM | POA: Insufficient documentation

## 2021-12-20 DIAGNOSIS — Z7901 Long term (current) use of anticoagulants: Secondary | ICD-10-CM | POA: Insufficient documentation

## 2021-12-20 DIAGNOSIS — Z86711 Personal history of pulmonary embolism: Secondary | ICD-10-CM | POA: Diagnosis not present

## 2021-12-20 DIAGNOSIS — C3491 Malignant neoplasm of unspecified part of right bronchus or lung: Secondary | ICD-10-CM | POA: Diagnosis not present

## 2021-12-20 NOTE — Progress Notes (Signed)
[facility] [phone]   Fax:([phone]  OFFICE PROGRESS NOTE  Paul Noble, MD [address] [place] [address]  DIAGNOSIS:  1) Stage IIIb (T3, N2, M0) non-small cell lung cancer, squamous cell carcinoma presented with large right upper lobe perihilar mass with right hilar and subcarinal lymphadenopathy diagnosed in April 2022. 2) left lower extremity deep venous thrombosis diagnosed in October 2022.  Currently on treatment with Xarelto.  PRIOR THERAPY:  1) Concurrent chemoradiation with weekly carboplatin for AUC of 2 and paclitaxel 45 Mg/M2.  Status post 8 cycles.  Last dose was given November 10, 2020. 2) Consolidation treatment with immunotherapy with Imfinzi 1500 Mg IV every 4 weeks.  First dose December 14, 2020.  Status post 3 cycles.  This was discontinued secondary to intolerance with highly suspicious immunotherapy mediated pneumonitis.  CURRENT THERAPY: Observation  INTERVAL HISTORY: Paul Mclean 61 y.o. male returns to the clinic today for follow-up visit.  The patient is feeling fine today with no concerning complaints.  He denied having any chest pain, shortness of breath, cough or hemoptysis.  He has no nausea, vomiting, diarrhea or constipation.  He has no headache or visual changes.  He denied having any significant weight loss or night sweats.  Is here today for evaluation with repeat CT scan of the chest for restaging of his disease.  MEDICAL HISTORY: Past Medical History:  Diagnosis Date   History of radiation therapy 11/12/2020   right lung  09/30/2020-11/12/2020  Dr Gery Pray   Hypothyroidism    Thyroid disease     ALLERGIES:  has No Known Allergies.  MEDICATIONS:  Current Outpatient Medications  Medication Sig Dispense Refill   albuterol (VENTOLIN HFA) 108 (90 Base) MCG/ACT inhaler Inhale 2 puffs into the lungs every 6 (six) hours as needed for wheezing or shortness of breath. 8 g 2   Budeson-Glycopyrrol-Formoterol  (BREZTRI AEROSPHERE) 160-9-4.8 MCG/ACT AERO Inhale 2 puffs into the lungs in the morning and at bedtime. 10.7 g 11   Budeson-Glycopyrrol-Formoterol (BREZTRI AEROSPHERE) 160-9-4.8 MCG/ACT AERO Inhale 2 puffs into the lungs in the morning and at bedtime. 5.9 g 0   levothyroxine (SYNTHROID) 112 MCG tablet Take 112 mcg by mouth in the morning.     predniSONE (DELTASONE) 5 MG tablet Take 1 tablet by mouth daily.     traMADol (ULTRAM) 50 MG tablet Take 50 mg by mouth every 6 (six) hours as needed.     XARELTO 20 MG TABS tablet TAKE 1 TABLET(20 MG) BY MOUTH DAILY WITH SUPPER 30 tablet 3   No current facility-administered medications for this visit.    SURGICAL HISTORY:  Past Surgical History:  Procedure Laterality Date   APPENDECTOMY     BRONCHIAL BIOPSY  09/01/2020   Procedure: BRONCHIAL BIOPSIES;  Surgeon: Garner Nash, DO;  Location: [facility];  Service: Pulmonary;;   BRONCHIAL BRUSHINGS  09/01/2020   Procedure: BRONCHIAL BRUSHINGS;  Surgeon: Garner Nash, DO;  Location: [facility];  Service: Pulmonary;;   BRONCHIAL NEEDLE ASPIRATION BIOPSY  09/01/2020   Procedure: BRONCHIAL NEEDLE ASPIRATION BIOPSIES;  Surgeon: Garner Nash, DO;  Location: [facility];  Service: Pulmonary;;   BRONCHIAL WASHINGS  09/01/2020   Procedure: BRONCHIAL WASHINGS;  Surgeon: Garner Nash, DO;  Location: [facility];  Service: Pulmonary;;   HERNIA REPAIR     INCISION AND DRAINAGE ABSCESS N/A 08/25/2017   Procedure: INCISION AND DRAINAGE perineal  ABSCESS;  Surgeon: Festus Aloe, MD;  Location:  [facility];  Service: Urology;  Laterality: N/A;   VIDEO BRONCHOSCOPY WITH ENDOBRONCHIAL ULTRASOUND N/A 09/01/2020   Procedure: VIDEO BRONCHOSCOPY WITH ENDOBRONCHIAL ULTRASOUND;  Surgeon: Garner Nash, DO;  Location: [facility];  Service: Pulmonary;  Laterality: N/A;    REVIEW OF SYSTEMS:  A comprehensive review of systems was negative.   PHYSICAL EXAMINATION: General appearance: alert, cooperative, and  no distress Head: Normocephalic, without obvious abnormality, atraumatic Neck: no adenopathy, no JVD, supple, symmetrical, trachea midline, and thyroid not enlarged, symmetric, no tenderness/mass/nodules Lymph nodes: Cervical, supraclavicular, and axillary nodes normal. Resp: clear to auscultation bilaterally Back: symmetric, no curvature. ROM normal. No CVA tenderness. Cardio: regular rate and rhythm, S1, S2 normal, no murmur, click, rub or gallop GI: soft, non-tender; bowel sounds normal; no masses,  no organomegaly Extremities: extremities normal, atraumatic, no cyanosis or edema  ECOG PERFORMANCE STATUS: 1 - Symptomatic but completely ambulatory  Blood pressure (!) 151/101, pulse 93, temperature 97.6 F (36.4 C), temperature source Oral, resp. rate 16, height 5\' 9"  (1.753 m), weight 208 lb 14.4 oz (94.8 kg), SpO2 97 %.  LABORATORY DATA: Lab Results  Component Value Date   WBC 5.8 08/16/2021   HGB 15.6 08/16/2021   HCT 45.4 08/16/2021   MCV 84.1 08/16/2021   PLT 210 08/16/2021      Chemistry      Component Value Date/Time   NA 136 08/16/2021 1356   K 3.9 08/16/2021 1356   CL 103 08/16/2021 1356   CO2 25 08/16/2021 1356   BUN 17 08/16/2021 1356   CREATININE 0.80 08/16/2021 1356      Component Value Date/Time   CALCIUM 9.6 08/16/2021 1356   ALKPHOS 79 08/16/2021 1356   AST 31 08/16/2021 1356   ALT 46 (H) 08/16/2021 1356   BILITOT 0.7 08/16/2021 1356       RADIOGRAPHIC STUDIES: CT Chest W Contrast  Result Date: 12/14/2021 CLINICAL DATA:  Primary Cancer Type: Lung Imaging Indication: Routine surveillance Interval therapy since last imaging? No Initial Cancer Diagnosis Date: 09/01/2020; Established by: Biopsy-proven Detailed Pathology: Stage IIIb non-small cell lung cancer, squamous cell carcinoma. Primary Tumor location:  Right upper lobe. Surgeries: No thoracic. Appendectomy. Chemotherapy: Yes; Ongoing? No; Most recent administration: 11/10/2020 Immunotherapy?  Yes;  Type: Imfinzi; Ongoing? No Radiation therapy? Yes; Date Range: 09/30/2020 - 11/12/2020; Target: Right lung * Tracking Code: BO * EXAM: CT CHEST WITH CONTRAST TECHNIQUE: Multidetector CT imaging of the chest was performed during intravenous contrast administration. RADIATION DOSE REDUCTION: This exam was performed according to the departmental dose-optimization program which includes automated exposure control, adjustment of the mA and/or kV according to patient size and/or use of iterative reconstruction technique. CONTRAST:  58mL OMNIPAQUE IOHEXOL 300 MG/ML  SOLN COMPARISON:  Most recent CT chest 08/16/2021. 09/02/2020 PET-CT. FINDINGS: Cardiovascular: There are no significant vascular findings. The heart size is normal. There is no pericardial effusion. Mediastinum/Nodes: There are no enlarged mediastinal, hilar or axillary lymph nodes. The thyroid gland, trachea and esophagus demonstrate no significant findings. Lungs/Pleura: No pleural effusion or pneumothorax. There are stable radiation changes in the right perihilar lung with associated volume loss, scarring and architectural distortion. No evidence of local recurrence. No suspicious pulmonary nodularity. Underlying centrilobular and paraseptal emphysema. Upper abdomen: The visualized upper abdomen appears stable. The liver is abnormal with diffuse low-density heterogeneity, unchanged from previous studies and without hypermetabolic activity on PET-CT. No adrenal mass. Musculoskeletal/Chest wall: There is no chest wall mass or suspicious osseous finding. Stable mild spondylosis. IMPRESSION: 1. Stable chest  CT without evidence of local recurrence or metastatic disease. 2. Stable radiation changes in the right perihilar region. 3. Stable heterogeneity of the liver which may be secondary to regenerating nodules and/or heterogeneous steatosis. 4.  Emphysema (ICD10-J43.9). Electronically Signed   By: Richardean Sale M.D.   On: 12/14/2021 11:08     ASSESSMENT AND  PLAN: This is a very pleasant 61 years old white male recently diagnosed with stage IIIb (T3, N2, M0) non-small cell lung cancer, squamous cell carcinoma presented with large right upper lobe perihilar mass with right hilar and subcarinal lymphadenopathy diagnosed in April 2022. The patient underwent a course of concurrent chemoradiation with weekly carboplatin and paclitaxel status post 8 cycles.  The patient tolerated his treatment well except for fatigue. His scan showed partial response with improvement in the right hilar mass as well as resolution of the right upper lobe lung nodule. The patient started on treatment with consolidation immunotherapy with Imfinzi 1500 Mg IV every 4 weeks status post 3 cycles.  He has been tolerating the treatment well except for the worsening cough and shortness of breath. His restaging scan after cycle #3 was concerning for immunotherapy mediated pneumonitis his treatment was discontinued and the patient was treated with a tapered dose of prednisone The patient is currently on observation and he is feeling fine with no concerning complaints. He had repeat CT scan of the chest performed recently.  I personally and independently reviewed the scan and discussed the result with the patient and his wife today. His scan showed no concerning findings for disease progression. I recommended for the patient to continue on observation with repeat CT scan of the chest in 6 months. The patient was advised to call immediately if he has any other concerning symptoms in the interval. For the history of pulmonary embolism, the patient will continue his current treatment with Xarelto.  The patient voices understanding of current disease status and treatment options and is in agreement with the current care plan.  All questions were answered. The patient knows to call the clinic with any problems, questions or concerns. We can certainly see the patient much sooner if  necessary.   Disclaimer: This note was dictated with voice recognition software. Similar sounding words can inadvertently be transcribed and may not be corrected upon review.

## 2022-01-03 ENCOUNTER — Telehealth: Payer: Self-pay | Admitting: *Deleted

## 2022-01-03 NOTE — Telephone Encounter (Addendum)
Connected with LEWIN PELLOW [phone] (home) regarding [facility] request for records [facility] form staff received 12/21/2021 for records May 09, 2021, through December 13, 2021.   Advised he has not signed [facility] request and has not signed a [place].  Unable to comply as request is invalid.  Provided [facility] ([place]) [facility] phone number to call for further advice.  Also advised of ability to obtain records through [facility] patient portal menu.  Currently no questions or needs.

## 2022-03-14 ENCOUNTER — Other Ambulatory Visit (HOSPITAL_COMMUNITY): Payer: Self-pay | Admitting: Internal Medicine

## 2022-03-14 DIAGNOSIS — K769 Liver disease, unspecified: Secondary | ICD-10-CM

## 2022-03-16 ENCOUNTER — Ambulatory Visit (HOSPITAL_COMMUNITY): Payer: Managed Care, Other (non HMO)

## 2022-03-16 ENCOUNTER — Encounter (HOSPITAL_COMMUNITY): Payer: Self-pay

## 2022-03-16 ENCOUNTER — Ambulatory Visit (HOSPITAL_COMMUNITY)
Admission: RE | Admit: 2022-03-16 | Discharge: 2022-03-16 | Disposition: A | Discharge status: Home / Self Care | Payer: Managed Care, Other (non HMO) | Source: Ambulatory Visit | Attending: Internal Medicine | Admitting: Internal Medicine

## 2022-03-16 DIAGNOSIS — K769 Liver disease, unspecified: Secondary | ICD-10-CM | POA: Insufficient documentation

## 2022-03-16 MED ORDER — IOHEXOL 300 MG/ML  SOLN
75.0000 mL | Freq: Once | INTRAMUSCULAR | Status: AC | PRN
  Administered 2022-03-16: 75 mL via INTRAVENOUS

## 2022-03-21 ENCOUNTER — Telehealth: Payer: Self-pay

## 2022-03-21 NOTE — Telephone Encounter (Signed)
Notified Patient of prior authorization approval for Xarelto. Medication is approved through 03/17/2023.  No other needs or concerns voiced at this time.

## 2022-03-30 ENCOUNTER — Encounter: Payer: Self-pay | Admitting: Pulmonary Disease

## 2022-03-30 ENCOUNTER — Telehealth: Payer: Self-pay | Admitting: Pulmonary Disease

## 2022-03-30 DIAGNOSIS — R918 Other nonspecific abnormal finding of lung field: Secondary | ICD-10-CM

## 2022-03-30 NOTE — Telephone Encounter (Signed)
PCCM:  I called and spoke with Paul Mclean and his significant other [name].  They are in agreements to having procedure completed on 04/05/2022.  Orders placed for bronchoscopy.  Garner Nash, DO [facility] 03/30/2022 11:53 AM

## 2022-03-30 NOTE — Telephone Encounter (Signed)
PCCM:  I called and left a message on the patient's voicemail as well as his significant other Crista Luria voicemail.  I was contacted by the patient's primary care provider that he had a recent CT scan of the chest completed at [facility] in [place] that showed progressive consolidation within the right hilum concerning for a mass.  Patient has a known history of malignancy.  He had reached out to Dr. Earlie Server with concern whether or not he had recurrence of disease.  Discussion is whether or not patient needs a repeat bronchoscopy.  Patient [place] here [place]  I will try to get a hold of him and we can come up with a plan on consideration for repeat bronchoscopy in the near future.  If possible I like to get him worked in on my schedule on 04/05/2022.  Please let me know if the patient calls the office back today so we can arrange to have this happen prior to next weeks.  He is on Xarelto and this would need to be held at least 2 full days prior to the procedure.  So if we are able to schedule him for 04/05/2022 his last dose of Xarelto would be the evening of the 25th.  Garner Nash, DO [facility] 03/30/2022 11:30 AM `

## 2022-03-30 NOTE — Telephone Encounter (Signed)
Pt has been scheduled.  I spoke to him and gave him appt info.

## 2022-04-01 ENCOUNTER — Ambulatory Visit (HOSPITAL_COMMUNITY): Payer: Managed Care, Other (non HMO)

## 2022-04-04 ENCOUNTER — Encounter (HOSPITAL_COMMUNITY): Payer: Self-pay | Admitting: Pulmonary Disease

## 2022-04-04 ENCOUNTER — Other Ambulatory Visit: Payer: Self-pay

## 2022-04-04 NOTE — Progress Notes (Signed)
Spoke with the patient. Pre-op phone call completed. New hx of Atrial flutter-chart for anesthesia review. Pt states last dose of Xarelto was on 04/01/22. EKG tracing for 02/28/21 faxed request today. Went over pre-op instructions with patient, answered all questions.

## 2022-04-04 NOTE — Progress Notes (Signed)
Anesthesia Chart Review: SAME DAY WORK-UP  Case: [id number] Date/Time: 04/05/22 1045   Procedure: VIDEO BRONCHOSCOPY WITH ENDOBRONCHIAL ULTRASOUND (Right)   Anesthesia type: General   Diagnosis: Hilar mass [R91.8]   Pre-op diagnosis: right hilar mass   Location: [facility] / [facility]   Surgeons: Garner Nash, DO       DISCUSSION: Patient is a 61 year old male scheduled for the above procedure.  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   ED visit at [facility] on 01/24/22 for right sided pleuritic chest pain. ACS ruled out and ECG reassuring. CTA negative for PE but demonstrated consolidation, and based on recent PET Scan results thought likely for scar tissue from radiation. He was treated with steroid taper for pleurisy. Of note, one blood culture ultimately grew Streptococcus salivarius, so he was contacted for re-evaluation on 01/24/22. He reported some night sweats, but no new symptoms. WBC normal with mild elevation in neutrophils. EKG and CXR stable. UA okay. Blood culture contamination felt more likely but advised hospital admission to evaluate for active bacteremia. He declined, so given Rocephin in ED and discharged with cefdinir.   02/28/22-03/01/22 admission at [facility] for fairly acute onset pleuritic chest pain and SOB with tachycardia. By notes, "Upon initial evaluation in the hospital ED the patient was tachycardic with a heart rate of 167 with telemetry suggesting SVT or atrial flutter with 2 1 conduction. Reportedly his initial ECG at 12:20  AM did not reveal evidence of atrial flutter although I am unable to see that ECG. Subsequent ECGs from 12:53 AM, 3:20 AM, and 3:37 AM reveals sinus tachycardia with heart rate in the 120s. Patient was treated with IV Cardizem and placed on IV Cardizem drip although had persistent sinus tachycardia with heart rate in the 120s. His laboratory evaluation is remarkable for sodium 129, chloride 92, glucose 174. Serum troponin levels are 31, 29, and 27. His TSH is elevated 6.12 but his free T4 is normal at 1.27. White blood cell count is elevated at 14.1. Chest x-ray shows redemonstrated dense consolidation within the right hilum compatible with patient's history of lung cancer. New patchy airspace opacities in the left lower lobe which may reflect pneumonia, atelectasis or other pulmonary process." EKG showed diffuse ST elevation concerning for MI, but cardiology felt more consistent with pericarditis and colchicine added to daily prednisone. Echocardiogram did not show any pleural or pericardial effusion, normal LVEF, no RWMA, no valvular abnormalities. Cardiology discharged him on ibuprofen for 2 weeks rather tan colchicine.  He was continued on prednisone 10 mg daily and deferred tapering to his primary oncologist and/or pulmonologist. (He is currently on 5 mg daily.)  Dr. Valeta Harms was contacted by patient's significant other and by his PCP to review recent CT from [place] showing progressive consolidation within the right hilum concerning for a mass. He also spoke with Mr. Kretschmer. Above procedure recommended. Advised he would need to hold Xarelto for at least a full two days, last dose 04/01/22.  Patient is a same day work-up. He has known lung cancer with concern for progression by 02/2022 imaging. Admission about 4-5 weeks ago for pericarditis treated with NSAID and  was already on prednisone. As above, he did see cardiology at that time. Reviewed with anesthesiologist Albertha Ghee, MD. Anesthesia team to  evaluate on the day of surgery. His last EKG in our system is > 1 year ago and his most recent EKG with [facility] was in the setting of pericarditis, so would favor updating EKG prior to surgery.       VS:  BP Readings from Last 3 Encounters:  12/20/21 (!) 151/101  08/23/21 118/74  08/18/21 (!) 132/97   Pulse Readings from Last 3 Encounters:  12/20/21 93  08/23/21 80  08/18/21 89     PROVIDERS: [name], MD is PCP  Curt Bears, MD is Edson Snowball, MD is [facility] In-patient cardiology consult by Ellery Plunk, MD 02/28/22 for diffuse ST elevation and diagnosed with pericarditis   LABS: For day of procedure as indicated. Labs on 02/28/22 ([facility]) show TSH elevated 6.12, normal FT4 1.27, WBC 14.1, H/H 14.3/43.0, PLT 334, NA 129, K 3.9, glucose 174, Cr 0.95, AST 21, ALT 25.   IMAGES: CTA Chest 02/28/22 ([facility]): IMPRESSION:  REDEMONSTRATED MASSLIKE CONSOLIDATION WITHIN THE RIGHT HILUM WITH PROGRESSIVE ATTENUATION OF PULMONARY ARTERIES WITHIN THE RIGHT UPPER AND MIDDLE LOBES AND NEW REGIONS OF COMPLETE LUMINAL ATTENUATION.   CT Abd 03/16/22: IMPRESSION: 1. Hepatic steatosis. No focal hepatic lesion or bile duct dilation. Normal CT appearance of the gallbladder. 2. Slight increase in size of a 3.5 cm fat-containing lesion in the right external oblique from 3.1 cm on 08/24/2017, likely lipoma. 3. Aortic Atherosclerosis (ICD10-I70.0) and Emphysema (ICD10-J43.9).   EKG:  EKG 02/28/22: Per Result Narrative in [facility] (in setting of acute pericarditis): "Diagnosis Normal sinus rhythm  ST elevation, consider early repolarization, pericarditis, or injury  Abnormal ECG  When compared with ECG of 28-Feb-2022 15:09,  No significant change was found  Ellery Plunk [phone]) on 96/08/5407 81:19:14 PM certifies that he/she has reviewed the ECG tracing and confirms the  independent interpretation is  correct."   Last EKG viewable in [facility] is > 60 year old. On 02/24/21:  SR, borderline RAD.    CV: Echo 02/28/22 ([facility]): Impression: Left Ventricle: Systolic function is normal. EF: 55-60%.    Left Ventricle: Doppler parameters indicate normal diastolic function.    Left Ventricle: There is mild concentric hypertrophy.    Left Ventricle: Wall motion is normal.    Left Atrium: Left atrium size is normal.    Aortic Valve: The aortic valve is tricuspid. The leaflets are not  thickened and exhibit normal excursion.    IVC/SVC: The inferior vena cava demonstrates a diameter of <=2.1 cm and  collapses >50%; therefore, the right atrial pressure is estimated at 3  mmHg.   Tricuspid Valve: The right ventricular systolic pressure is normal (<36  mmHg).   Pericardium: There is no pericardial effusion.    LE Venous US 02/25/21: IMPRESSION: 1. LEFT lower extremity DVT, as above including, partially occlusive within the LEFT CFV and occlusive within the LEFT PTV. 2. Sluggish flow without discrete thrombus within the RIGHT popliteal vein.     Past Medical History:  Diagnosis Date   Atrial flutter ([place])    Cancer ([place])    right lung cancer   Dyspnea    due to cancer per pt   History of radiation therapy 11/12/2020   right lung  09/30/2020-11/12/2020  Dr Gery Pray   Hypothyroidism    Thyroid disease     Past Surgical History:  Procedure Laterality Date  APPENDECTOMY     BRONCHIAL BIOPSY  09/01/2020   Procedure: BRONCHIAL BIOPSIES;  Surgeon: Garner Nash, DO;  Location: [facility];  Service: Pulmonary;;   BRONCHIAL BRUSHINGS  09/01/2020   Procedure: BRONCHIAL BRUSHINGS;  Surgeon: Garner Nash, DO;  Location: [facility];  Service: Pulmonary;;   BRONCHIAL NEEDLE ASPIRATION BIOPSY  09/01/2020   Procedure: BRONCHIAL NEEDLE ASPIRATION BIOPSIES;  Surgeon: Garner Nash, DO;  Location: [place];  Service: Pulmonary;;   BRONCHIAL WASHINGS  09/01/2020   Procedure: BRONCHIAL WASHINGS;  Surgeon: Garner Nash, DO;  Location: [place];   Service: Pulmonary;;   HERNIA REPAIR     INCISION AND DRAINAGE ABSCESS N/A 08/25/2017   Procedure: INCISION AND DRAINAGE perineal  ABSCESS;  Surgeon: Festus Aloe, MD;  Location: [facility];  Service: Urology;  Laterality: N/A;   VIDEO BRONCHOSCOPY WITH ENDOBRONCHIAL ULTRASOUND N/A 09/01/2020   Procedure: VIDEO BRONCHOSCOPY WITH ENDOBRONCHIAL ULTRASOUND;  Surgeon: Garner Nash, DO;  Location: [place];  Service: Pulmonary;  Laterality: N/A;    MEDICATIONS: No current facility-administered medications for this encounter.    albuterol (VENTOLIN HFA) 108 (90 Base) MCG/ACT inhaler   Budeson-Glycopyrrol-Formoterol (BREZTRI AEROSPHERE) 160-9-4.8 MCG/ACT AERO   ibuprofen (ADVIL) 200 MG tablet   ipratropium-albuterol (DUONEB) 0.5-2.5 (3) MG/3ML SOLN   levothyroxine (SYNTHROID) 112 MCG tablet   morphine (MSIR) 15 MG tablet   predniSONE (DELTASONE) 10 MG tablet   XARELTO 20 MG TABS tablet    Myra Gianotti, PA-C Surgical Short Stay/Anesthesiology [facility] Phone [phone] [facility] Phone [phone] 04/04/2022 2:47 PM

## 2022-04-04 NOTE — Anesthesia Preprocedure Evaluation (Signed)
Anesthesia Evaluation  Patient identified by MRN, date of birth, ID band Patient awake    Reviewed: Allergy & Precautions, NPO status , Patient's Chart, lab work & pertinent test results  Airway Mallampati: II       Dental   Pulmonary shortness of breath, pneumonia, former smoker   breath sounds clear to auscultation       Cardiovascular  Rhythm:Regular Rate:Normal  History noted Dr. Nyoka Cowden    Echo 02/28/22 ([facility]): Impression: LeftVentricle: Systolic function is normal. EF: 55-60%.   LeftVentricle: Doppler parameters indicate normal diastolic function.   LeftVentricle: There is mild concentric hypertrophy.   LeftVentricle: Wall motion is normal.   LeftAtrium: Leftatrium size is normal.   AorticValve: The aortic valve is tricuspid. The leaflets are not  thickened and exhibit normal excursion.   IVC/SVC: The inferior vena cava demonstrates a diameter of <=2.1 cm and  collapses >50%; therefore, the right atrial pressure is estimated at 3  mmHg.  TricuspidValve: The right ventricular systolic pressure is normal (<36  mmHg).  Pericardium: There is no pericardial effusion.       Neuro/Psych    GI/Hepatic negative GI ROS, Neg liver ROS,,,  Endo/Other  Hypothyroidism    Renal/GU negative Renal ROS     Musculoskeletal   Abdominal   Peds  Hematology   Anesthesia Other Findings   Reproductive/Obstetrics                             Anesthesia Physical Anesthesia Plan  ASA: 3  Anesthesia Plan: General   Post-op Pain Management: Tylenol PO (pre-op)*   Induction: Intravenous  PONV Risk Score and Plan: 3 and Ondansetron, Dexamethasone and Midazolam  Airway Management Planned: Oral ETT  Additional Equipment:   Intra-op Plan:   Post-operative Plan: Possible Post-op intubation/ventilation  Informed Consent: I have reviewed the patients History and  Physical, chart, labs and discussed the procedure including the risks, benefits and alternatives for the proposed anesthesia with the patient or authorized representative who has indicated his/her understanding and acceptance.     Dental advisory given  Plan Discussed with: CRNA and Anesthesiologist  Anesthesia Plan Comments: (PAT note written 04/04/2022 by Myra Gianotti, PA-C.  )       Anesthesia Quick Evaluation

## 2022-04-05 ENCOUNTER — Ambulatory Visit (HOSPITAL_COMMUNITY): Payer: Managed Care, Other (non HMO) | Admitting: Physician Assistant

## 2022-04-05 ENCOUNTER — Telehealth: Payer: Self-pay | Admitting: Medical Oncology

## 2022-04-05 ENCOUNTER — Ambulatory Visit (HOSPITAL_COMMUNITY)
Admission: RE | Admit: 2022-04-05 | Discharge: 2022-04-05 | Disposition: A | Discharge status: Home / Self Care | Payer: Managed Care, Other (non HMO) | Attending: Pulmonary Disease | Admitting: Pulmonary Disease

## 2022-04-05 ENCOUNTER — Ambulatory Visit (HOSPITAL_BASED_OUTPATIENT_CLINIC_OR_DEPARTMENT_OTHER): Payer: Managed Care, Other (non HMO) | Admitting: Physician Assistant

## 2022-04-05 ENCOUNTER — Other Ambulatory Visit: Payer: Self-pay | Admitting: Physician Assistant

## 2022-04-05 ENCOUNTER — Encounter (HOSPITAL_COMMUNITY)
Admission: RE | Disposition: A | Discharge status: Home / Self Care | Payer: Self-pay | Source: Home / Self Care | Attending: Pulmonary Disease

## 2022-04-05 DIAGNOSIS — Y842 Radiological procedure and radiotherapy as the cause of abnormal reaction of the patient, or of later complication, without mention of misadventure at the time of the procedure: Secondary | ICD-10-CM | POA: Diagnosis not present

## 2022-04-05 DIAGNOSIS — J701 Chronic and other pulmonary manifestations due to radiation: Secondary | ICD-10-CM | POA: Insufficient documentation

## 2022-04-05 DIAGNOSIS — Z01818 Encounter for other preprocedural examination: Secondary | ICD-10-CM

## 2022-04-05 DIAGNOSIS — J189 Pneumonia, unspecified organism: Secondary | ICD-10-CM

## 2022-04-05 DIAGNOSIS — J479 Bronchiectasis, uncomplicated: Secondary | ICD-10-CM | POA: Diagnosis not present

## 2022-04-05 DIAGNOSIS — Z7901 Long term (current) use of anticoagulants: Secondary | ICD-10-CM | POA: Diagnosis not present

## 2022-04-05 DIAGNOSIS — C3431 Malignant neoplasm of lower lobe, right bronchus or lung: Secondary | ICD-10-CM | POA: Insufficient documentation

## 2022-04-05 DIAGNOSIS — R918 Other nonspecific abnormal finding of lung field: Secondary | ICD-10-CM | POA: Diagnosis present

## 2022-04-05 DIAGNOSIS — Z86718 Personal history of other venous thrombosis and embolism: Secondary | ICD-10-CM | POA: Insufficient documentation

## 2022-04-05 DIAGNOSIS — Z1152 Encounter for screening for COVID-19: Secondary | ICD-10-CM | POA: Insufficient documentation

## 2022-04-05 DIAGNOSIS — Z9221 Personal history of antineoplastic chemotherapy: Secondary | ICD-10-CM | POA: Diagnosis not present

## 2022-04-05 DIAGNOSIS — E039 Hypothyroidism, unspecified: Secondary | ICD-10-CM | POA: Insufficient documentation

## 2022-04-05 DIAGNOSIS — C3490 Malignant neoplasm of unspecified part of unspecified bronchus or lung: Secondary | ICD-10-CM

## 2022-04-05 DIAGNOSIS — Z87891 Personal history of nicotine dependence: Secondary | ICD-10-CM | POA: Insufficient documentation

## 2022-04-05 HISTORY — PX: VIDEO BRONCHOSCOPY WITH ENDOBRONCHIAL ULTRASOUND: SHX6177

## 2022-04-05 HISTORY — DX: Dyspnea, unspecified: R06.00

## 2022-04-05 HISTORY — DX: Malignant (primary) neoplasm, unspecified: C80.1

## 2022-04-05 HISTORY — DX: Unspecified atrial flutter: I48.92

## 2022-04-05 HISTORY — PX: FINE NEEDLE ASPIRATION: SHX5430

## 2022-04-05 HISTORY — PX: BRONCHIAL BIOPSY: SHX5109

## 2022-04-05 LAB — BASIC METABOLIC PANEL
Anion gap: 11 (ref 5–15)
BUN: 12 mg/dL (ref 6–20)
CO2: 26 mmol/L (ref 22–32)
Calcium: 8.6 mg/dL — ABNORMAL LOW (ref 8.9–10.3)
Chloride: 98 mmol/L (ref 98–111)
Creatinine, Ser: 0.96 mg/dL (ref 0.61–1.24)
GFR, Estimated: >60 mL/min (ref >60)
Glucose, Bld: 140 mg/dL — ABNORMAL HIGH (ref 70–99)
Potassium: 3.6 mmol/L (ref 3.5–5.1)
Sodium: 135 mmol/L (ref 135–145)

## 2022-04-05 LAB — CBC
HCT: 41.5 % (ref 39.0–52.0)
Hemoglobin: 13.6 g/dL (ref 13.0–17.0)
MCH: 30.2 pg (ref 26.0–34.0)
MCHC: 32.8 g/dL (ref 30.0–36.0)
MCV: 92.2 fL (ref 80.0–100.0)
Platelets: 229 10*3/uL (ref 150–400)
RBC: 4.5 MIL/uL (ref 4.22–5.81)
RDW: 14 % (ref 11.5–15.5)
WBC: 7.9 10*3/uL (ref 4.0–10.5)
nRBC: 0 % (ref 0.0–0.2)

## 2022-04-05 LAB — SARS CORONAVIRUS 2 BY RT PCR: SARS Coronavirus 2 by RT PCR: NEGATIVE

## 2022-04-05 SURGERY — BRONCHOSCOPY, WITH EBUS
Anesthesia: General | Laterality: Right

## 2022-04-05 MED ORDER — HYDROMORPHONE HCL 1 MG/ML IJ SOLN: 0.2500 mg | INTRAMUSCULAR | Status: DC | PRN

## 2022-04-05 MED ORDER — SUGAMMADEX SODIUM 200 MG/2ML IV SOLN
INTRAVENOUS | Status: DC | PRN
  Administered 2022-04-05: 200 mg via INTRAVENOUS

## 2022-04-05 MED ORDER — SUCCINYLCHOLINE CHLORIDE 200 MG/10ML IV SOSY
PREFILLED_SYRINGE | INTRAVENOUS | Status: DC | PRN
  Administered 2022-04-05: 160 mg via INTRAVENOUS

## 2022-04-05 MED ORDER — LACTATED RINGERS IV SOLN: INTRAVENOUS | Status: DC

## 2022-04-05 MED ORDER — ONDANSETRON HCL 4 MG/2ML IJ SOLN
INTRAMUSCULAR | Status: DC | PRN
  Administered 2022-04-05: 4 mg via INTRAVENOUS

## 2022-04-05 MED ORDER — PHENYLEPHRINE HCL-NACL 20-0.9 MG/250ML-% IV SOLN: INTRAVENOUS | Status: DC | PRN

## 2022-04-05 MED ORDER — MIDAZOLAM HCL 5 MG/5ML IJ SOLN
INTRAMUSCULAR | Status: DC | PRN
  Administered 2022-04-05: 2 mg via INTRAVENOUS

## 2022-04-05 MED ORDER — CHLORHEXIDINE GLUCONATE 0.12 % MT SOLN: 15.0000 mL | Freq: Once | OROMUCOSAL | Status: AC

## 2022-04-05 MED ORDER — PHENYLEPHRINE 80 MCG/ML (10ML) SYRINGE FOR IV PUSH (FOR BLOOD PRESSURE SUPPORT)
PREFILLED_SYRINGE | INTRAVENOUS | Status: DC | PRN
  Administered 2022-04-05: 80 ug via INTRAVENOUS
  Administered 2022-04-05 (×2): 160 ug via INTRAVENOUS

## 2022-04-05 MED ORDER — ROCURONIUM BROMIDE 10 MG/ML (PF) SYRINGE
PREFILLED_SYRINGE | INTRAVENOUS | Status: DC | PRN
  Administered 2022-04-05: 40 mg via INTRAVENOUS

## 2022-04-05 MED ORDER — LIDOCAINE 2% (20 MG/ML) 5 ML SYRINGE
INTRAMUSCULAR | Status: DC | PRN
  Administered 2022-04-05: 100 mg via INTRAVENOUS

## 2022-04-05 MED ORDER — DEXAMETHASONE SODIUM PHOSPHATE 10 MG/ML IJ SOLN
INTRAMUSCULAR | Status: DC | PRN
  Administered 2022-04-05: 10 mg via INTRAVENOUS

## 2022-04-05 MED ORDER — PROPOFOL 10 MG/ML IV BOLUS
INTRAVENOUS | Status: DC | PRN
  Administered 2022-04-05: 150 mg via INTRAVENOUS

## 2022-04-05 MED ORDER — CHLORHEXIDINE GLUCONATE 0.12 % MT SOLN
OROMUCOSAL | Status: AC
  Administered 2022-04-05: 15 mL via OROMUCOSAL
  Filled 2022-04-05: qty 15

## 2022-04-05 MED ORDER — FENTANYL CITRATE (PF) 100 MCG/2ML IJ SOLN
INTRAMUSCULAR | Status: DC | PRN
  Administered 2022-04-05 (×2): 50 ug via INTRAVENOUS

## 2022-04-05 MED ORDER — PHENYLEPHRINE HCL-NACL 20-0.9 MG/250ML-% IV SOLN
INTRAVENOUS | Status: DC | PRN
  Administered 2022-04-05: 40 ug/min via INTRAVENOUS

## 2022-04-05 SURGICAL SUPPLY — 29 items

## 2022-04-05 NOTE — Telephone Encounter (Signed)
Bronchoscopy today . Appts for next week confirmed.

## 2022-04-05 NOTE — Discharge Instructions (Signed)
Flexible Bronchoscopy, Care After This sheet gives you information about how to care for yourself after your test. Your doctor may also give you more specific instructions. If you have problems or questions, contact your doctor. Follow these instructions at home: Eating and drinking Do not eat or drink anything (not even water) for 2 hours after your test, or until your numbing medicine (local anesthetic) wears off. When your numbness is gone and your cough and gag reflexes have come back, you may: Eat only soft foods. Slowly drink liquids. The day after the test, go back to your normal diet. Driving Do not drive for 24 hours if you were given a medicine to help you relax (sedative). Do not drive or use heavy machinery while taking prescription pain medicine. General instructions  Take over-the-counter and prescription medicines only as told by your doctor. Return to your normal activities as told. Ask what activities are safe for you. Do not use any products that have nicotine or tobacco in them. This includes cigarettes and e-cigarettes. If you need help quitting, ask your doctor. Keep all follow-up visits as told by your doctor. This is important. It is very important if you had a tissue sample (biopsy) taken. Get help right away if: You have shortness of breath that gets worse. You get light-headed. You feel like you are going to pass out (faint). You have chest pain. You cough up: More than a little blood. More blood than before. Summary Do not eat or drink anything (not even water) for 2 hours after your test, or until your numbing medicine wears off. Do not use cigarettes. Do not use e-cigarettes. Get help right away if you have chest pain.  This information is not intended to replace advice given to you by your health care provider. Make sure you discuss any questions you have with your health care provider. Document Released: 02/20/2009 Document Revised: 04/07/2017 Document  Reviewed: 05/13/2016 Elsevier Patient Education  2020 [employer].

## 2022-04-05 NOTE — Op Note (Signed)
Video Bronchoscopy with Endobronchial Ultrasound Procedure Note  Date of Operation: 04/05/2022  Pre-op Diagnosis: lung mass   Post-op Diagnosis: lung mass   Surgeon: Garner Nash, DO   Assistants: None  Anesthesia: General endotracheal anesthesia  Operation: Flexible video fiberoptic bronchoscopy with endobronchial ultrasound and biopsies.  Estimated Blood Loss: Minimal  Complications: None   Indications and History: [name] is a 61 y.o. male with lung mass .  The risks, benefits, complications, treatment options and expected outcomes were discussed with the patient.  The possibilities of pneumothorax, pneumonia, reaction to medication, pulmonary aspiration, perforation of a viscus, bleeding, failure to diagnose a condition and creating a complication requiring transfusion or operation were discussed with the patient who freely signed the consent.    Description of Procedure: The patient was examined in the preoperative area and history and data from the preprocedure consultation were reviewed. It was deemed appropriate to proceed.  The patient was taken to [facility], identified as [name] and the procedure verified as Flexible Video Fiberoptic Bronchoscopy.  A Time Out was held and the above information confirmed. After being taken to the operating room general anesthesia was initiated and the patient  was orally intubated. The video fiberoptic bronchoscope was introduced via the endotracheal tube and a general inspection was performed which showed right upper lobe compression with concern for endobronchial tumor, irregular mucosa in the right lower lobe. The standard scope was then withdrawn and the endobronchial ultrasound was used to identify and characterize the peritracheal, hilar and bronchial lymph nodes. Inspection showed enlarged right upper lobe mass, no significant adenopathy in the right hilum or subcarinal space. Using real-time ultrasound guidance Wang needle  biopsies were take from large right upper lobe mass and were sent for cytology.  We then switched to a standard therapeutic bronchoscope and 2.8 mm [employer] forceps were used to take endobronchial biopsies from the right lower lobe irregular mucosa as well as severe irregular mucosa in the right upper lobe.  We used a standard therapeutic bronchoscope for aspiration of the bilateral mainstem to remove any remaining blood clots and debris.  There was no significant bleeding at the termination of the procedure.  The patient tolerated the procedure well without apparent complications. There was no significant blood loss. The bronchoscope was withdrawn. Anesthesia was reversed and the patient was taken to the PACU for recovery.   Samples: 1. Wang needle biopsies from right upper lobe mass 2.  Endobronchial biopsies to the right upper lobe 3.  Endobronchial biopsies to the right lower lobe  Plans:  The patient will be discharged from the PACU to home when recovered from anesthesia. We will review the cytology, pathology results with the patient when they become available. Outpatient followup will be with Garner Nash, DO .   Garner Nash, DO [facility] 04/05/2022 11:38 AM `

## 2022-04-05 NOTE — H&P (Signed)
Synopsis: Referred in May 2022 for s/p bronch PCP: by [name], MD   Subjective:    PATIENT ID: Paul Paul GENDER: male DOB: 08-25-1960, MRN: [id number]       Chief Complaint  Patient presents with   Follow-up      Pt states he has been doing okay since last visit and denies any complaints.      This is a 61 year old gentleman past medical history of hypothyroidism, works as a Chief Strategy Officer.  He longstanding history of smoking.  Quit 10 years ago.  Patient initially presented to [facility] emergency department.  Had a CT scan on 08/10/2020 with lung mass.  Patient was brought into the hospital for evaluation consultation on 09/01/2020 followed by bronchoscopy for tissue sampling.  Patient had a 4 x 6 x 2 cm right upper lobe hilar mass concerning for malignancy.  Patient ultimately underwent a nuclear medicine PET scan on 06/05/2020.  This nuclear medicine pet image revealed a perihilar right upper lobe mass with concern for postobstructive pneumonia and a 1 cm low right paratracheal and subcarinal node.  Patient had pathology reports that came back positive for malignancy.  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   Patient here today with complaints of dyspnea on exertion and shortness of breath.  CT imaging does have evidence of centrilobular emphysema.  Patient likely has a diagnosis of COPD.  He is not on any current maintenance inhalers.   OV 06/03/2021: Patient here today for follow-up.  Initially saw me for lung mass status postbiopsy undergoing treatments with medical oncology. Patient recently seen in the office by Dr. Earlie Server.  05/20/2021 office note reviewed patient has underwent concurrent chemoradiation last dose July 2022 consolidation treatment with immunotherapy first dose started August 2022.  Patient still having ongoing cough shortness of breath.  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  After cycle 3 was treated with a tapering dose of prednisone.  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  At this time just using albuterol for management of respiratory symptoms.  Not on a maintenance inhaler.  He feels much better after completing courses of steroids.  He does feel like his cough is improved.   OV 08/23/2021: Here today for follow-up.  He has been doing really well after completing his treatments.  He is using his Breztri inhaler.  Using his spacer.  Rarely using his albuterol.  Still has constant cough sputum production.  He is able to keep his airways clear most of the time.   OV 04/05/2022: here today for planned outpatient bronchoscopy         Past Medical History:  Diagnosis Date   History of radiation therapy 11/12/2020    right lung  09/30/2020-11/12/2020  Dr Gery Pray   Hypothyroidism     Thyroid disease             Family History  Problem Relation Age of Onset   Cancer Mother             Past Surgical History:  Procedure Laterality Date   APPENDECTOMY       BRONCHIAL BIOPSY   09/01/2020    Procedure: BRONCHIAL BIOPSIES;  Surgeon: Garner Nash, DO;  Location: [facility];  Service: Pulmonary;;   BRONCHIAL BRUSHINGS   09/01/2020    Procedure: BRONCHIAL BRUSHINGS;  Surgeon: Garner Nash, DO;  Location: [facility];  Service: Pulmonary;;   BRONCHIAL NEEDLE ASPIRATION BIOPSY   09/01/2020    Procedure: BRONCHIAL NEEDLE ASPIRATION BIOPSIES;  Surgeon: Garner Nash, DO;  Location: [facility];  Service: Pulmonary;;   BRONCHIAL WASHINGS   09/01/2020    Procedure: BRONCHIAL WASHINGS;  Surgeon: Garner Nash, DO;  Location: [facility];  Service: Pulmonary;;   HERNIA REPAIR       INCISION AND DRAINAGE ABSCESS N/A 08/25/2017    Procedure:  INCISION AND DRAINAGE perineal  ABSCESS;  Surgeon: Festus Aloe, MD;  Location: [facility];  Service: Urology;  Laterality: N/A;   VIDEO BRONCHOSCOPY WITH ENDOBRONCHIAL ULTRASOUND N/A 09/01/2020    Procedure: VIDEO BRONCHOSCOPY WITH ENDOBRONCHIAL ULTRASOUND;  Surgeon: Garner Nash, DO;  Location: [facility];  Service: Pulmonary;  Laterality: N/A;      Social History         Socioeconomic History   Marital status: Single      Spouse name: Not on file   Number of children: Not on file   Years of education: Not on file   Highest education level: Not on file  Occupational History   Not on file  Tobacco Use   Smoking status: Former      Packs/day: 2.00      Years: 30.00      Pack years: 60.00      Types: Cigarettes      Quit date: 2008      Years since quitting: 15.3   Smokeless tobacco: Never  Vaping Use   Vaping Use: Never used  Substance and Sexual Activity   Alcohol use: Yes      Alcohol/week: 1.0 - 2.0 standard drink      Types: 1 - 2 Cans of beer per week      Comment: few beers everyday   Drug use: Never   Sexual activity: Not on file  Other Topics Concern   Not on file  Social History Narrative   Not on file    Social Determinants of Health       Financial Resource Strain: Low Risk    Difficulty of Paying Living Expenses: Not hard at all  Food Insecurity: No Food Insecurity   Worried About Charity fundraiser in the Last Year: Never true   [place] in the Last Year: Never true  Transportation Needs: No Transportation Needs   Lack of Transportation (Medical): No   Lack of Transportation (Non-Medical): No  Physical Activity: Not on file  Stress: No Stress Concern Present   Feeling of Stress : Not at all  Social Connections: Unknown   Frequency of Communication with Friends and Family: More than three times a week   Frequency of Social Gatherings with Friends and Family: More than three times a week   Attends Religious Services: Not on Advertising copywriter or Organizations: Not on file   Attends Archivist Meetings: Not on file   Marital Status: Living with partner  Intimate Partner Violence: Not At Risk   Fear of Current or Ex-Partner: No   Emotionally Abused: No   Physically Abused: No   Sexually Abused: No      No Known Allergies          Outpatient Medications Prior to Visit  Medication Sig  Dispense Refill   albuterol (VENTOLIN HFA) 108 (90 Base) MCG/ACT inhaler Inhale 2 puffs into the lungs every 6 (six) hours as needed for wheezing or shortness of breath. 8 g 2   levothyroxine (SYNTHROID) 112 MCG tablet Take 112 mcg by mouth in the morning.       XARELTO 20 MG TABS tablet TAKE 1 TABLET(20 MG) BY MOUTH DAILY WITH SUPPER 30 tablet 3   Budeson-Glycopyrrol-Formoterol (BREZTRI AEROSPHERE) 160-9-4.8 MCG/ACT AERO Inhale 2 puffs into the lungs in the morning and at bedtime. 10.7 g 0   b complex vitamins capsule Take 1 capsule by mouth daily. (Patient not taking: Reported on 08/18/2021)       benzonatate (TESSALON PERLES) 100 MG capsule Take 1 capsule (100 mg total) by mouth 3 (three) times daily as needed for cough. (Patient not taking: Reported on 08/18/2021) 30 capsule 0   feeding supplement (ENSURE ENLIVE / ENSURE PLUS) LIQD Take 237 mLs by mouth 2 (two) times daily between meals. (Patient not taking: Reported on 08/18/2021) 237 mL 12   guaiFENesin (MUCINEX) 600 MG 12 hr tablet Take 1 tablet (600 mg total) by mouth 2 (two) times daily. (Patient not taking: Reported on 08/18/2021) 60 tablet 2   HYDROcodone bit-homatropine (HYDROMET) 5-1.5 MG/5ML syrup Take 5 mLs by mouth every 6 (six) hours as needed for cough. (Patient not taking: Reported on 08/18/2021) 120 mL 0   prochlorperazine (COMPAZINE) 10 MG tablet TAKE 1 TABLET(10 MG) BY MOUTH EVERY 6 HOURS AS NEEDED FOR NAUSEA OR VOMITING (Patient not taking: Reported on 08/18/2021) 30 tablet 2   vitamin C (ASCORBIC ACID) 500 MG tablet Take 500 mg by mouth daily. (Patient  not taking: Reported on 08/18/2021)        No facility-administered medications prior to visit.     Review of Systems  Constitutional:  Negative for chills, fever, malaise/fatigue and weight loss.  HENT:  Negative for hearing loss, sore throat and tinnitus.   Eyes:  Negative for blurred vision and double vision.  Respiratory:  Positive for cough, sputum production and shortness of breath. Negative for hemoptysis, wheezing and stridor.   Cardiovascular:  Negative for chest pain, palpitations, orthopnea, leg swelling and PND.  Gastrointestinal:  Negative for abdominal pain, constipation, diarrhea, heartburn, nausea and vomiting.  Genitourinary:  Negative for dysuria, hematuria and urgency.  Musculoskeletal:  Negative for joint pain and myalgias.  Skin:  Negative for itching and rash.  Neurological:  Negative for dizziness, tingling, weakness and headaches.  Endo/Heme/Allergies:  Negative for environmental allergies. Does not bruise/bleed easily.  Psychiatric/Behavioral:  Negative for depression. The patient is not nervous/anxious and does not have insomnia.   All other systems reviewed and are negative.       Objective:   General appearance: 61 y.o., male, NAD, conversant  Eyes: anicteric sclerae, moist conjunctivae; no lid-lag; PERRLA, tracking appropriately HENT: NCAT; oropharynx, MMM, no mucosal ulcerations; normal hard and soft palate Neck: Trachea midline; FROM, supple, lymphadenopathy, no JVD Lungs: CTAB, no crackles, no wheeze, with normal respiratory effort and no intercostal retractions CV: RRR, S1, S2, no MRGs  Abdomen: Soft, non-tender; non-distended, BS present  Extremities: No peripheral edema, radial and DP pulses present bilaterally  Skin: Normal temperature, turgor and texture; no rash Psych: Appropriate affect Neuro: Alert and oriented to person and place, no focal deficit     BP (!) 160/113   Pulse (!) 102   Temp 97.9 F (36.6 C) (Oral)   Resp 18   Ht 5\' 9"   (  1.753 m)   Wt 93 kg   SpO2 95%   BMI 30.27 kg/m     Chest Imaging:   CT chest 08/10/2020: 6 cm right upper lobe hilar lesion concerning for bronchogenic carcinoma with associated adenopathy. The patient's images have been independently reviewed by me.     09/02/2020 nuclear medicine pet imaging: PET avid right upper lobe hilar lesion concerning for an advanced age bronchogenic carcinoma with associated adenopathy. The patient's images have been independently reviewed by me.     January 2023: CT chest: Areas of groundglass in the upper lobe still persistent on the left side.  Scarring from radiation on the right. Overall CT much improved from his previous imaging back in the fall when he had significant pneumonitis. The patient's images have been independently reviewed by me.     Pulmonary Functions Testing Results:       View : No data to display.             FeNO:    Pathology:    Echocardiogram:    Heart Catheterization:     Assessment & Plan:        ICD-10-CM    1. 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 Pneumonitis  J18.9       3. Radiation fibrosis of lung ([place])  J70.1       4. Bronchiectasis without complication ([place])  O11.5             Discussion:   This is a 61 year old gentleman, diagnosis of stage III squamous cell carcinoma of the lung status post concurrent chemoradiation developed pneumonitis with Imfinzi.  Was treated with steroids.  Likely has COPD unspecified with evidence of radiation fibrosis in the right lung and areas of bronchiectasis without complication.  Recent ct chest completed at [facility] in [place]. Concern for mass in there right hilum and atelectasis, possible endobronchial lesion   Plan:  Bronchoscopy today to view airways Possible EBUS  Discussed risks benefits and alternatives   Garner Nash, DO [facility] 04/05/2022 8:43 AM

## 2022-04-05 NOTE — Interval H&P Note (Signed)
History and Physical Interval Note:  04/05/2022 8:45 AM  [name]  has presented today for surgery, with the diagnosis of right hilar mass.  The various methods of treatment have been discussed with the patient and family. After consideration of risks, benefits and other options for treatment, the patient has consented to  Procedure(s): [place] (Right) as a surgical intervention.  The patient's history has been reviewed, patient examined, no change in status, stable for surgery.  I have reviewed the patient's chart and labs.  Questions were answered to the patient's satisfaction.     [place]

## 2022-04-05 NOTE — Anesthesia Postprocedure Evaluation (Signed)
Anesthesia Post Note  Patient: [name]  Procedure(s) Performed: VIDEO BRONCHOSCOPY WITH ENDOBRONCHIAL ULTRASOUND (Right) FINE NEEDLE ASPIRATION (FNA) LINEAR BRONCHIAL BIOPSIES     Patient location during evaluation: PACU Anesthesia Type: General Level of consciousness: awake Pain management: pain level controlled Vital Signs Assessment: post-procedure vital signs reviewed and stable Respiratory status: spontaneous breathing Cardiovascular status: stable Postop Assessment: no apparent nausea or vomiting Anesthetic complications: no   No notable events documented.  Last Vitals:  Vitals:   04/05/22 1045 04/05/22 1100  BP: (!) 135/95 117/79  Pulse: (!) 103 (!) 103  Resp: (!) 25 (!) 22  Temp:    SpO2: 98% 93%    Last Pain:  Vitals:   04/05/22 1045  TempSrc:   PainSc: 0-No pain                 Dolton Shaker

## 2022-04-05 NOTE — Anesthesia Procedure Notes (Addendum)
Procedure Name: Intubation Date/Time: 04/05/2022 9:42 AM  Performed by: Janene Harvey, CRNAPre-anesthesia Checklist: Patient identified, Emergency Drugs available, Suction available and Patient being monitored Patient Re-evaluated:Patient Re-evaluated prior to induction Oxygen Delivery Method: Circle system utilized Preoxygenation: Pre-oxygenation with 100% oxygen Induction Type: IV induction Ventilation: Mask ventilation without difficulty Laryngoscope Size: Mac and 4 Grade View: Grade I Tube type: Oral Tube size: 8.5 mm Number of attempts: 1 Airway Equipment and Method: Stylet and Oral airway Placement Confirmation: ETT inserted through vocal cords under direct vision, positive ETCO2 and breath sounds checked- equal and bilateral Secured at: 22 cm Tube secured with: Tape Dental Injury: Teeth and Oropharynx as per pre-operative assessment  Comments: SRNA K Venrick

## 2022-04-05 NOTE — Transfer of Care (Signed)
Immediate Anesthesia Transfer of Care Note  Patient: [name]  Procedure(s) Performed: VIDEO BRONCHOSCOPY WITH ENDOBRONCHIAL ULTRASOUND (Right) FINE NEEDLE ASPIRATION (FNA) LINEAR BRONCHIAL BIOPSIES  Patient Location: PACU  Anesthesia Type:General  Level of Consciousness: awake and patient cooperative  Airway & Oxygen Therapy: Patient Spontanous Breathing and Patient connected to face mask oxygen  Post-op Assessment: Report given to RN and Post -op Vital signs reviewed and stable  Post vital signs: Reviewed and stable  Last Vitals:  Vitals Value Taken Time  BP 135/95 04/05/22 1045  Temp 37 C 04/05/22 1043  Pulse 104 04/05/22 1047  Resp 22 04/05/22 1047  SpO2 93 % 04/05/22 1047  Vitals shown include unvalidated device data.  Last Pain:  Vitals:   04/05/22 0812  TempSrc:   PainSc: 0-No pain         Complications: No notable events documented.

## 2022-04-06 LAB — CYTOLOGY - NON PAP

## 2022-04-06 LAB — SURGICAL PATHOLOGY

## 2022-04-06 NOTE — Progress Notes (Signed)
I called and spoke with patient regarding pathology results.   Thanks,  BLI  Garner Nash, DO [facility] 04/06/2022 6:10 PM

## 2022-04-08 ENCOUNTER — Encounter (HOSPITAL_COMMUNITY): Payer: Self-pay | Admitting: Pulmonary Disease

## 2022-04-12 ENCOUNTER — Ambulatory Visit (HOSPITAL_COMMUNITY)
Admission: RE | Admit: 2022-04-12 | Discharge: 2022-04-12 | Disposition: A | Discharge status: Home / Self Care | Payer: Managed Care, Other (non HMO) | Source: Ambulatory Visit | Attending: Physician Assistant | Admitting: Physician Assistant

## 2022-04-12 DIAGNOSIS — C3490 Malignant neoplasm of unspecified part of unspecified bronchus or lung: Secondary | ICD-10-CM | POA: Insufficient documentation

## 2022-04-12 MED ORDER — GADOBUTROL 1 MMOL/ML IV SOLN
9.0000 mL | Freq: Once | INTRAVENOUS | Status: AC | PRN
  Administered 2022-04-12: 9 mL via INTRAVENOUS

## 2022-04-12 NOTE — Progress Notes (Unsigned)
[facility] OFFICE PROGRESS NOTE  Paul Noble, MD [address] [place] [address]  DIAGNOSIS:  1) Stage IIIb (T3, N2, M0) non-small cell lung cancer, squamous cell carcinoma presented with large right upper lobe perihilar mass with right hilar and subcarinal lymphadenopathy diagnosed in April 2022. 2) left lower extremity deep venous thrombosis diagnosed in October 2022.  Currently on treatment with Xarelto.  PRIOR THERAPY: 1) Concurrent chemoradiation with weekly carboplatin for AUC of 2 and paclitaxel 45 Mg/M2.  Status post 8 cycles.  Last dose was given November 10, 2020. 2) Consolidation treatment with immunotherapy with Imfinzi 1500 Mg IV every 4 weeks.  First dose December 14, 2020.  Status post 3 cycles.  This was discontinued secondary to intolerance with highly suspicious immunotherapy mediated pneumonitis.  CURRENT THERAPY: Palliative systemic chemotherapy with carboplatin for an AUC of 5, all paclitaxel 175 mg/m, and Keytruda 200 mg IV every 3 weeks with Neulasta support.  First dose expected on 04/21/2022.  INTERVAL HISTORY: Paul Mclean 61 y.o. male returns to clinic today for follow-up visit.  The patient was last seen by Dr. Julien Nordmann on 12/20/2021.  The patient has a history of stage IIIb lung cancer.  The patient splits his time between locally and in [place].  The patient had a CT scan of his chest performed at a [facility] facility in [place] which showed progressive consolidation in the right hilum concerning for a mass.  The patient then followed up with Dr. Valeta Harms who arranged for anoscopy and biopsy which was performed on 11/28/thousand 23.  Final pathology was consistent with recurrent non-small cell lung cancer, squamous cell carcinoma.  Otherwise, since last being seen the patient denies any other changes in his health.  Denies any fever, chills, night sweats, or unexplained weight loss.  Denies any chest pain, shortness of breath, cough,  or hemoptysis.  Denies any nausea, vomiting, diarrhea, or constipation.  Denies any headache or visual changes.  Denies any rashes or skin changes.  The patient is here today to review his pathology results and for more detailed discussion about his current condition and recommended treatment options.  Brain MRI today and PET scan 04/20/22  MEDICAL HISTORY: Past Medical History:  Diagnosis Date   Atrial flutter ([place])    question of brief aflutter 02/28/22 in setting of acute pericarditis (not captured on ECG)   Cancer ([place])    right lung cancer   DVT (deep venous thrombosis) ([place]) 02/27/2021   LLE partially occlusive DVT 02/25/21, started on Xarelto   Dyspnea    due to cancer per pt   History of radiation therapy 11/12/2020   right lung  09/30/2020-11/12/2020  Dr Gery Pray   Hypothyroidism    Thyroid disease     ALLERGIES:  has No Known Allergies.  MEDICATIONS:  Current Outpatient Medications  Medication Sig Dispense Refill   albuterol (VENTOLIN HFA) 108 (90 Base) MCG/ACT inhaler Inhale 2 puffs into the lungs every 6 (six) hours as needed for wheezing or shortness of breath. 8 g 2   Budeson-Glycopyrrol-Formoterol (BREZTRI AEROSPHERE) 160-9-4.8 MCG/ACT AERO Inhale 2 puffs into the lungs in the morning and at bedtime. 10.7 g 11   ibuprofen (ADVIL) 200 MG tablet Take 200 mg by mouth in the morning and at bedtime.     ipratropium-albuterol (DUONEB) 0.5-2.5 (3) MG/3ML SOLN Inhale 3 mLs into the lungs every 6 (six) hours as needed (wheezing/shortness of breath).     levothyroxine (SYNTHROID) 112 MCG tablet Take  112 mcg by mouth daily before breakfast.     morphine (MSIR) 15 MG tablet Take 15 mg by mouth every 4 (four) hours as needed (pain.).     predniSONE (DELTASONE) 10 MG tablet Take 5 mg by mouth in the morning.     XARELTO 20 MG TABS tablet TAKE 1 TABLET(20 MG) BY MOUTH DAILY WITH SUPPER (Patient taking differently: Take 20 mg by mouth in the morning.) 30 tablet 3   No current  facility-administered medications for this visit.    SURGICAL HISTORY:  Past Surgical History:  Procedure Laterality Date   APPENDECTOMY     BRONCHIAL BIOPSY  09/01/2020   Procedure: BRONCHIAL BIOPSIES;  Surgeon: Garner Nash, DO;  Location: [facility];  Service: Pulmonary;;   BRONCHIAL BIOPSY  04/05/2022   Procedure: BRONCHIAL BIOPSIES;  Surgeon: Garner Nash, DO;  Location: [facility];  Service: Pulmonary;;   BRONCHIAL BRUSHINGS  09/01/2020   Procedure: BRONCHIAL BRUSHINGS;  Surgeon: Garner Nash, DO;  Location: [facility];  Service: Pulmonary;;   BRONCHIAL NEEDLE ASPIRATION BIOPSY  09/01/2020   Procedure: BRONCHIAL NEEDLE ASPIRATION BIOPSIES;  Surgeon: Garner Nash, DO;  Location: [facility];  Service: Pulmonary;;   BRONCHIAL WASHINGS  09/01/2020   Procedure: BRONCHIAL WASHINGS;  Surgeon: Garner Nash, DO;  Location: [facility];  Service: Pulmonary;;   FINE NEEDLE ASPIRATION  04/05/2022   Procedure: FINE NEEDLE ASPIRATION (FNA) LINEAR;  Surgeon: Garner Nash, DO;  Location: [facility];  Service: Pulmonary;;   HERNIA REPAIR     INCISION AND DRAINAGE ABSCESS N/A 08/25/2017   Procedure: INCISION AND DRAINAGE perineal  ABSCESS;  Surgeon: Festus Aloe, MD;  Location: [facility];  Service: Urology;  Laterality: N/A;   VIDEO BRONCHOSCOPY WITH ENDOBRONCHIAL ULTRASOUND N/A 09/01/2020   Procedure: VIDEO BRONCHOSCOPY WITH ENDOBRONCHIAL ULTRASOUND;  Surgeon: Garner Nash, DO;  Location: [facility];  Service: Pulmonary;  Laterality: N/A;   VIDEO BRONCHOSCOPY WITH ENDOBRONCHIAL ULTRASOUND Right 04/05/2022   Procedure: VIDEO BRONCHOSCOPY WITH ENDOBRONCHIAL ULTRASOUND;  Surgeon: Garner Nash, DO;  Location: [facility];  Service: Pulmonary;  Laterality: Right;    REVIEW OF SYSTEMS:   Review of Systems  Constitutional: Negative for appetite change, chills, fatigue, fever and unexpected weight change.  HENT:   Negative for mouth sores, nosebleeds, sore throat  and trouble swallowing.   Eyes: Negative for eye problems and icterus.  Respiratory: Negative for cough, hemoptysis, shortness of breath and wheezing.   Cardiovascular: Negative for chest pain and leg swelling.  Gastrointestinal: Negative for abdominal pain, constipation, diarrhea, nausea and vomiting.  Genitourinary: Negative for bladder incontinence, difficulty urinating, dysuria, frequency and hematuria.   Musculoskeletal: Negative for back pain, gait problem, neck pain and neck stiffness.  Skin: Negative for itching and rash.  Neurological: Negative for dizziness, extremity weakness, gait problem, headaches, light-headedness and seizures.  Hematological: Negative for adenopathy. Does not bruise/bleed easily.  Psychiatric/Behavioral: Negative for confusion, depression and sleep disturbance. The patient is not nervous/anxious.     PHYSICAL EXAMINATION:  There were no vitals taken for this visit.  ECOG PERFORMANCE STATUS: {CHL ONC ECOG [id number]  Physical Exam  Constitutional: Oriented to person, place, and time and well-developed, well-nourished, and in no distress. No distress.  HENT:  Head: Normocephalic and atraumatic.  Mouth/Throat: Oropharynx is clear and moist. No oropharyngeal exudate.  Eyes: Conjunctivae are normal. Right eye exhibits no discharge. Left eye exhibits no discharge. No scleral icterus.  Neck: Normal range of motion. Neck supple.  Cardiovascular: Normal rate,  regular rhythm, normal heart sounds and intact distal pulses.   Pulmonary/Chest: Effort normal and breath sounds normal. No respiratory distress. No wheezes. No rales.  Abdominal: Soft. Bowel sounds are normal. Exhibits no distension and no mass. There is no tenderness.  Musculoskeletal: Normal range of motion. Exhibits no edema.  Lymphadenopathy:    No cervical adenopathy.  Neurological: Alert and oriented to person, place, and time. Exhibits normal muscle tone. Gait normal. Coordination normal.   Skin: Skin is warm and dry. No rash noted. Not diaphoretic. No erythema. No pallor.  Psychiatric: Mood, memory and judgment normal.  Vitals reviewed.  LABORATORY DATA: Lab Results  Component Value Date   WBC 7.9 04/05/2022   HGB 13.6 04/05/2022   HCT 41.5 04/05/2022   MCV 92.2 04/05/2022   PLT 229 04/05/2022      Chemistry      Component Value Date/Time   NA 135 04/05/2022 0813   K 3.6 04/05/2022 0813   CL 98 04/05/2022 0813   CO2 26 04/05/2022 0813   BUN 12 04/05/2022 0813   CREATININE 0.96 04/05/2022 0813   CREATININE 0.80 08/16/2021 1356      Component Value Date/Time   CALCIUM 8.6 (L) 04/05/2022 0813   ALKPHOS 79 08/16/2021 1356   AST 31 08/16/2021 1356   ALT 46 (H) 08/16/2021 1356   BILITOT 0.7 08/16/2021 1356       RADIOGRAPHIC STUDIES:  CT ABDOMEN W CONTRAST  Result Date: 03/21/2022 CLINICAL DATA:  Several week history of right upper quadrant pain EXAM: CT ABDOMEN WITH CONTRAST TECHNIQUE: Multidetector CT imaging of the abdomen was performed using the standard protocol following bolus administration of intravenous contrast. RADIATION DOSE REDUCTION: This exam was performed according to the departmental dose-optimization program which includes automated exposure control, adjustment of the mA and/or kV according to patient size and/or use of iterative reconstruction technique. CONTRAST:  81mL OMNIPAQUE IOHEXOL 300 MG/ML  SOLN COMPARISON:  CT abdomen and pelvis dated 08/24/2017, CT chest dated 12/13/2021 FINDINGS: Lower chest: Partially imaged radiation changes involving the right hilum, better evaluated on prior chest CT. Partially imaged right lung centrilobular and paraseptal emphysema. Lingular subsegmental atelectasis/scarring. No pleural effusion or pneumothorax demonstrated. Partially imaged heart size is normal. Hepatobiliary: Diffuse parenchymal hypoattenuation can be seen with hepatic steatosis. No focal lesions. No intra or extrahepatic biliary ductal  dilation. Normal gallbladder. Pancreas: No focal lesions or main ductal dilation. Spleen: Normal in size without focal abnormality. Adrenals/Urinary Tract: No adrenal nodules. No renal masses, calculi or hydronephrosis. Subcentimeter right upper pole hypodensity, too small to characterize. No follow-up imaging recommended. Stomach/Bowel: Normal appearance of the stomach. No evidence of bowel wall thickening, distention, or inflammatory changes. Vascular/Lymphatic: Aortic atherosclerosis. No enlarged abdominal lymph nodes. Other: No free fluid, fluid collection, or free air. Musculoskeletal: No acute or abnormal lytic or blastic osseous lesions. Slight increase in size of a 3.5 cm fat-containing lesion in the right external oblique from 3.1 cm on 08/24/2017, likely lipoma. IMPRESSION: 1. Hepatic steatosis. No focal hepatic lesion or bile duct dilation. Normal CT appearance of the gallbladder. 2. Slight increase in size of a 3.5 cm fat-containing lesion in the right external oblique from 3.1 cm on 08/24/2017, likely lipoma. 3. Aortic Atherosclerosis (ICD10-I70.0) and Emphysema (ICD10-J43.9). Electronically Signed   By: Darrin Nipper M.D.   On: 03/21/2022 09:06     ASSESSMENT/PLAN:  This is a very pleasant 61 year old Caucasian male diagnosed with recurrent non-small cell lung cancer, squamous cell carcinoma. He was initially diagnosed with stage  IIIb (T3, N2, M0) non-small cell lung cancer, squamous cell carcinoma.  The patient presented with a large right upper lobe perihilar mass with right hilar and subcarinal lymphadenopathy.  The patient was diagnosed in April 2022.  The patient completed a course of concurrent chemoradiation with weekly carboplatin and paclitaxel.  The patient is currently status post 8 cycles. His last dose was on 11/10/20.   He then was on immunotherapy with Imfinzi 1500 mg IV every 4 weeks.  He is status post 3 cycles.  It was discontinued secondary to concerns for pneumonitis.  He had  been on observation until his PCP at [facility] ordered a CT scan which showed progressive consolidation in the right hilum concerning for a lung mass.  Dr. Valeta Harms arrange for a bronchoscopy on 04/05/2022 and the final pathology was consistent with recurrent squamous cell carcinoma.  The patient was seen with Dr. Julien Nordmann today.  Dr. Julien Nordmann had a lengthy discussion with the patient today about his current condition and recommended treatment options.  Dr. Julien Nordmann recommended palliative systemic chemotherapy with carboplatin for an AUC of 5, paclitaxel 175 mg/m, and Keytruda 200 mg IV every 3 weeks.  The patient is interested in this option and he is expected to undergo his first cycle of treatment next week on 12/14/thousand 23.  Side effects of treatment were discussed including but not limited to nausea, vomiting, fatigue, myelosuppression, alopecia, peripheral neuropathy, kidney, liver dysfunction.  We will see the patient back for follow-up visit in 2 weeks for evaluation and 1 week follow-up visit to manage any adverse side effects of treatment.  I will send a prescription for Compazine 10 mg p.o. every 6 hours to the patient's pharmacy for nausea and vomiting.  For his history of pulmonary embolism.  He will continue on Xarelto.  The patient was advised to call immediately if she has any concerning symptoms in the interval. The patient voices understanding of current disease status and treatment options and is in agreement with the current care plan. All questions were answered. The patient knows to call the clinic with any problems, questions or concerns. We can certainly see the patient much sooner if necessary      Brain MRI today and PET scan 04/20/22  No orders of the defined types were placed in this encounter.    I spent {CHL ONC TIME VISIT - QTMAU:[id number]} counseling the patient face to face. The total time spent in the appointment was {CHL ONC TIME VISIT -  [facility]SL[facility]:[id number]}.  Adlai Sinning L Derren Suydam, PA-C 04/12/22

## 2022-04-13 ENCOUNTER — Other Ambulatory Visit: Payer: Self-pay

## 2022-04-13 ENCOUNTER — Telehealth: Payer: Self-pay

## 2022-04-13 ENCOUNTER — Other Ambulatory Visit: Payer: Self-pay | Admitting: Physician Assistant

## 2022-04-13 ENCOUNTER — Inpatient Hospital Stay: Payer: Managed Care, Other (non HMO) | Attending: Physician Assistant

## 2022-04-13 ENCOUNTER — Inpatient Hospital Stay (HOSPITAL_BASED_OUTPATIENT_CLINIC_OR_DEPARTMENT_OTHER): Payer: Managed Care, Other (non HMO) | Admitting: Physician Assistant

## 2022-04-13 VITALS — BP 161/102 | HR 98 | Temp 97.5°F | Resp 16 | Wt 212.4 lb

## 2022-04-13 DIAGNOSIS — J432 Centrilobular emphysema: Secondary | ICD-10-CM | POA: Insufficient documentation

## 2022-04-13 DIAGNOSIS — Z923 Personal history of irradiation: Secondary | ICD-10-CM | POA: Insufficient documentation

## 2022-04-13 DIAGNOSIS — C3490 Malignant neoplasm of unspecified part of unspecified bronchus or lung: Secondary | ICD-10-CM

## 2022-04-13 DIAGNOSIS — J90 Pleural effusion, not elsewhere classified: Secondary | ICD-10-CM | POA: Insufficient documentation

## 2022-04-13 DIAGNOSIS — Z9221 Personal history of antineoplastic chemotherapy: Secondary | ICD-10-CM | POA: Diagnosis not present

## 2022-04-13 DIAGNOSIS — I729 Aneurysm of unspecified site: Secondary | ICD-10-CM

## 2022-04-13 DIAGNOSIS — E039 Hypothyroidism, unspecified: Secondary | ICD-10-CM | POA: Diagnosis not present

## 2022-04-13 DIAGNOSIS — Z86718 Personal history of other venous thrombosis and embolism: Secondary | ICD-10-CM | POA: Insufficient documentation

## 2022-04-13 DIAGNOSIS — Z7901 Long term (current) use of anticoagulants: Secondary | ICD-10-CM | POA: Insufficient documentation

## 2022-04-13 DIAGNOSIS — C3491 Malignant neoplasm of unspecified part of right bronchus or lung: Secondary | ICD-10-CM | POA: Diagnosis not present

## 2022-04-13 DIAGNOSIS — C3431 Malignant neoplasm of lower lobe, right bronchus or lung: Secondary | ICD-10-CM | POA: Insufficient documentation

## 2022-04-13 LAB — CBC WITH DIFFERENTIAL (CANCER CENTER ONLY)
Abs Immature Granulocytes: 0.03 10*3/uL (ref 0.00–0.07)
Basophils Absolute: 0 10*3/uL (ref 0.0–0.1)
Basophils Relative: 0 %
Eosinophils Absolute: 0.1 10*3/uL (ref 0.0–0.5)
Eosinophils Relative: 2 %
HCT: 45.3 % (ref 39.0–52.0)
Hemoglobin: 15.5 g/dL (ref 13.0–17.0)
Immature Granulocytes: 1 %
Lymphocytes Relative: 9 %
Lymphs Abs: 0.5 10*3/uL — ABNORMAL LOW (ref 0.7–4.0)
MCH: 31.1 pg (ref 26.0–34.0)
MCHC: 34.2 g/dL (ref 30.0–36.0)
MCV: 90.8 fL (ref 80.0–100.0)
Monocytes Absolute: 0.6 10*3/uL (ref 0.1–1.0)
Monocytes Relative: 11 %
Neutro Abs: 4.3 10*3/uL (ref 1.7–7.7)
Neutrophils Relative %: 77 %
Platelet Count: 221 10*3/uL (ref 150–400)
RBC: 4.99 MIL/uL (ref 4.22–5.81)
RDW: 13.8 % (ref 11.5–15.5)
WBC Count: 5.6 10*3/uL (ref 4.0–10.5)
nRBC: 0 % (ref 0.0–0.2)

## 2022-04-13 LAB — CMP (CANCER CENTER ONLY)
ALT: 37 U/L (ref 0–44)
AST: 26 U/L (ref 15–41)
Albumin: 3.7 g/dL (ref 3.5–5.0)
Alkaline Phosphatase: 102 U/L (ref 38–126)
Anion gap: 8 (ref 5–15)
BUN: 13 mg/dL (ref 6–20)
CO2: 29 mmol/L (ref 22–32)
Calcium: 9.8 mg/dL (ref 8.9–10.3)
Chloride: 100 mmol/L (ref 98–111)
Creatinine: 0.8 mg/dL (ref 0.61–1.24)
GFR, Estimated: >60 mL/min (ref >60)
Glucose, Bld: 138 mg/dL — ABNORMAL HIGH (ref 70–99)
Potassium: 3.7 mmol/L (ref 3.5–5.1)
Sodium: 137 mmol/L (ref 135–145)
Total Bilirubin: 0.5 mg/dL (ref 0.3–1.2)
Total Protein: 7.7 g/dL (ref 6.5–8.1)

## 2022-04-13 NOTE — Telephone Encounter (Signed)
This nurse spoke with [facility] and requested PDL1 testing to be completed on Cytology specimen from 11/28 fine needle aspiration specimen.  No further questions or concerns at this time.

## 2022-04-13 NOTE — Progress Notes (Signed)
I called the patient to review the results of his brain MRI which incidentally noted a 5 mm aneurysm.  I received a message from one of the interventional neuroradiologist who stated they would be happy to see him for consultation.  I called the patient and he would be interested in this.  I have placed a referral.

## 2022-04-15 ENCOUNTER — Other Ambulatory Visit: Payer: Self-pay

## 2022-04-15 ENCOUNTER — Encounter (HOSPITAL_COMMUNITY): Payer: Self-pay

## 2022-04-15 NOTE — Progress Notes (Signed)
The proposed treatment discussed in conference is for discussion purpose only and is not a binding recommendation.  The patients have not been physically examined, or presented with their treatment options.  Therefore, final treatment plans cannot be decided.  

## 2022-04-19 ENCOUNTER — Telehealth (HOSPITAL_COMMUNITY): Payer: Self-pay

## 2022-04-19 NOTE — Telephone Encounter (Signed)
Called to schedule a consult with Dr. Debbrah Alar, no answer, left vm. AW

## 2022-04-20 ENCOUNTER — Encounter (HOSPITAL_COMMUNITY)
Admission: RE | Admit: 2022-04-20 | Discharge: 2022-04-20 | Disposition: A | Discharge status: Home / Self Care | Payer: Managed Care, Other (non HMO) | Source: Ambulatory Visit | Attending: Physician Assistant | Admitting: Physician Assistant

## 2022-04-20 DIAGNOSIS — C3490 Malignant neoplasm of unspecified part of unspecified bronchus or lung: Secondary | ICD-10-CM | POA: Diagnosis present

## 2022-04-20 LAB — GLUCOSE, CAPILLARY: Glucose-Capillary: 149 mg/dL — ABNORMAL HIGH (ref 70–99)

## 2022-04-20 MED ORDER — FLUDEOXYGLUCOSE F - 18 (FDG) INJECTION
10.6000 | Freq: Once | INTRAVENOUS | Status: AC | PRN
  Administered 2022-04-20: 10.62 via INTRAVENOUS

## 2022-04-21 ENCOUNTER — Ambulatory Visit (HOSPITAL_COMMUNITY)
Admission: RE | Admit: 2022-04-21 | Discharge: 2022-04-21 | Disposition: A | Discharge status: Home / Self Care | Payer: Managed Care, Other (non HMO) | Source: Ambulatory Visit | Attending: Physician Assistant | Admitting: Physician Assistant

## 2022-04-21 DIAGNOSIS — I729 Aneurysm of unspecified site: Secondary | ICD-10-CM

## 2022-04-22 ENCOUNTER — Inpatient Hospital Stay: Payer: Managed Care, Other (non HMO) | Admitting: Internal Medicine

## 2022-04-22 ENCOUNTER — Encounter (HOSPITAL_COMMUNITY): Payer: Self-pay

## 2022-04-22 ENCOUNTER — Other Ambulatory Visit: Payer: Self-pay

## 2022-04-22 VITALS — BP 152/107 | HR 90 | Temp 97.7°F | Resp 17 | Wt 213.7 lb

## 2022-04-22 DIAGNOSIS — C3490 Malignant neoplasm of unspecified part of unspecified bronchus or lung: Secondary | ICD-10-CM

## 2022-04-22 DIAGNOSIS — C3431 Malignant neoplasm of lower lobe, right bronchus or lung: Secondary | ICD-10-CM | POA: Diagnosis not present

## 2022-04-22 MED ORDER — MORPHINE SULFATE 15 MG PO TABS: 15.0000 mg | ORAL_TABLET | Freq: Four times a day (QID) | ORAL | 0 refills | Status: DC | PRN

## 2022-04-22 NOTE — Progress Notes (Signed)
[facility] [phone]   [phone]  OFFICE PROGRESS NOTE  [name], MD [address] [place] [address]  DIAGNOSIS:  1) Stage IIIb (T3, N2, M0) non-small cell lung cancer, squamous cell carcinoma presented with large right upper lobe perihilar mass with right hilar and subcarinal lymphadenopathy diagnosed in April 2022. 2) left lower extremity deep venous thrombosis diagnosed in October 2022.  Currently on treatment with Xarelto.  PRIOR THERAPY:  1) Concurrent chemoradiation with weekly carboplatin for AUC of 2 and paclitaxel 45 Mg/M2.  Status post 8 cycles.  Last dose was given November 10, 2020. 2) Consolidation treatment with immunotherapy with Imfinzi 1500 Mg IV every 4 weeks.  First dose December 14, 2020.  Status post 3 cycles.  This was discontinued secondary to intolerance with highly suspicious immunotherapy mediated pneumonitis.  CURRENT THERAPY: Observation  INTERVAL HISTORY: Paul Mclean 61 y.o. male returns to the clinic today for follow-up visit accompanied by his wife.  The patient is feeling fine today with no concerning complaints except for the persistent cough and aching pain in the central right chest area.  He is currently on morphine sulfate for pain management.  He denied having any hemoptysis.  He has no nausea, vomiting, diarrhea or constipation.  He has no headache or visual changes.  He denied having any fever or chills.  He has no concerning weight loss or night sweats.  He had a PET scan as well as MRI of the brain performed recently and he is here for evaluation and discussion of his imaging studies and treatment options.   MEDICAL HISTORY: Past Medical History:  Diagnosis Date   Atrial flutter ([facility])    question of brief aflutter 02/28/22 in setting of acute pericarditis (not captured on ECG)   Cancer ([facility])    right lung cancer   DVT (deep venous thrombosis) ([facility]) 02/27/2021   LLE partially occlusive DVT  02/25/21, started on Xarelto   Dyspnea    due to cancer per pt   History of radiation therapy 11/12/2020   right lung  09/30/2020-11/12/2020  Dr Gery Pray   Hypothyroidism    Thyroid disease     ALLERGIES:  has No Known Allergies.  MEDICATIONS:  Current Outpatient Medications  Medication Sig Dispense Refill   albuterol (VENTOLIN HFA) 108 (90 Base) MCG/ACT inhaler Inhale 2 puffs into the lungs every 6 (six) hours as needed for wheezing or shortness of breath. 8 g 2   Budeson-Glycopyrrol-Formoterol (BREZTRI AEROSPHERE) 160-9-4.8 MCG/ACT AERO Inhale 2 puffs into the lungs in the morning and at bedtime. 10.7 g 11   ibuprofen (ADVIL) 200 MG tablet Take 200 mg by mouth in the morning and at bedtime.     ipratropium-albuterol (DUONEB) 0.5-2.5 (3) MG/3ML SOLN Inhale 3 mLs into the lungs every 6 (six) hours as needed (wheezing/shortness of breath).     levothyroxine (SYNTHROID) 112 MCG tablet Take 112 mcg by mouth daily before breakfast.     morphine (MSIR) 15 MG tablet Take 15 mg by mouth every 4 (four) hours as needed (pain.).     predniSONE (DELTASONE) 10 MG tablet Take 5 mg by mouth in the morning.     XARELTO 20 MG TABS tablet TAKE 1 TABLET(20 MG) BY MOUTH DAILY WITH SUPPER (Patient taking differently: Take 20 mg by mouth in the morning.) 30 tablet 3   No current facility-administered medications for this visit.    SURGICAL HISTORY:  Past Surgical History:  Procedure Laterality Date   APPENDECTOMY     BRONCHIAL BIOPSY  09/01/2020   Procedure: BRONCHIAL BIOPSIES;  Surgeon: Garner Nash, DO;  Location: [facility];  Service: Pulmonary;;   BRONCHIAL BIOPSY  04/05/2022   Procedure: BRONCHIAL BIOPSIES;  Surgeon: Garner Nash, DO;  Location: [facility];  Service: Pulmonary;;   BRONCHIAL BRUSHINGS  09/01/2020   Procedure: BRONCHIAL BRUSHINGS;  Surgeon: Garner Nash, DO;  Location: [facility];  Service: Pulmonary;;   BRONCHIAL NEEDLE ASPIRATION BIOPSY  09/01/2020   Procedure:  BRONCHIAL NEEDLE ASPIRATION BIOPSIES;  Surgeon: Garner Nash, DO;  Location: [facility];  Service: Pulmonary;;   BRONCHIAL WASHINGS  09/01/2020   Procedure: BRONCHIAL WASHINGS;  Surgeon: Garner Nash, DO;  Location: [facility];  Service: Pulmonary;;   FINE NEEDLE ASPIRATION  04/05/2022   Procedure: FINE NEEDLE ASPIRATION (FNA) LINEAR;  Surgeon: Garner Nash, DO;  Location: [facility];  Service: Pulmonary;;   HERNIA REPAIR     INCISION AND DRAINAGE ABSCESS N/A 08/25/2017   Procedure: INCISION AND DRAINAGE perineal  ABSCESS;  Surgeon: Festus Aloe, MD;  Location: [facility];  Service: Urology;  Laterality: N/A;   VIDEO BRONCHOSCOPY WITH ENDOBRONCHIAL ULTRASOUND N/A 09/01/2020   Procedure: VIDEO BRONCHOSCOPY WITH ENDOBRONCHIAL ULTRASOUND;  Surgeon: Garner Nash, DO;  Location: [facility];  Service: Pulmonary;  Laterality: N/A;   VIDEO BRONCHOSCOPY WITH ENDOBRONCHIAL ULTRASOUND Right 04/05/2022   Procedure: VIDEO BRONCHOSCOPY WITH ENDOBRONCHIAL ULTRASOUND;  Surgeon: Garner Nash, DO;  Location: [facility];  Service: Pulmonary;  Laterality: Right;    REVIEW OF SYSTEMS:  Constitutional: positive for fatigue Eyes: negative Ears, nose, mouth, throat, and face: negative Respiratory: positive for cough, dyspnea on exertion, and pleurisy/chest pain Cardiovascular: negative Gastrointestinal: negative Genitourinary:negative Integument/breast: negative Hematologic/lymphatic: negative Musculoskeletal:negative Neurological: negative Behavioral/Psych: negative Endocrine: negative Allergic/Immunologic: negative   PHYSICAL EXAMINATION: General appearance: alert, cooperative, fatigued, and no distress Head: Normocephalic, without obvious abnormality, atraumatic Neck: no adenopathy, no JVD, supple, symmetrical, trachea midline, and thyroid not enlarged, symmetric, no tenderness/mass/nodules Lymph nodes: Cervical, supraclavicular, and axillary nodes normal. Resp: rales RUL and  wheezes RUL Back: symmetric, no curvature. ROM normal. No CVA tenderness. Cardio: regular rate and rhythm, S1, S2 normal, no murmur, click, rub or gallop GI: soft, non-tender; bowel sounds normal; no masses,  no organomegaly Extremities: extremities normal, atraumatic, no cyanosis or edema Neurologic: Alert and oriented X 3, normal strength and tone. Normal symmetric reflexes. Normal coordination and gait  ECOG PERFORMANCE STATUS: 1 - Symptomatic but completely ambulatory  Blood pressure (!) 152/107, pulse 90, temperature 97.7 F (36.5 C), temperature source Oral, resp. rate 17, weight 213 lb 11.2 oz (96.9 kg), SpO2 97 %.  LABORATORY DATA: Lab Results  Component Value Date   WBC 5.6 04/13/2022   HGB 15.5 04/13/2022   HCT 45.3 04/13/2022   MCV 90.8 04/13/2022   PLT 221 04/13/2022      Chemistry      Component Value Date/Time   NA 137 04/13/2022 0837   K 3.7 04/13/2022 0837   CL 100 04/13/2022 0837   CO2 29 04/13/2022 0837   BUN 13 04/13/2022 0837   CREATININE 0.80 04/13/2022 0837      Component Value Date/Time   CALCIUM 9.8 04/13/2022 0837   ALKPHOS 102 04/13/2022 0837   AST 26 04/13/2022 0837   ALT 37 04/13/2022 0837   BILITOT 0.5 04/13/2022 0837       RADIOGRAPHIC STUDIES: NM PET Image Restag (PS) Skull Base To Thigh  Result  Date: 04/21/2022 CLINICAL DATA:  Subsequent treatment strategy for non-small-cell lung cancer. EXAM: NUCLEAR MEDICINE PET SKULL BASE TO THIGH TECHNIQUE: 10.6 mCi F-18 FDG was injected intravenously. Full-ring PET imaging was performed from the skull base to thigh after the radiotracer. CT data was obtained and used for attenuation correction and anatomic localization. Fasting blood glucose: 149 mg/dl COMPARISON:  Abdomen CT 03/16/2022. Chest CT 12/13/2021. PET-CT 09/02/2020 FINDINGS: Mediastinal blood pool activity: SUV max 2.7 Liver activity: SUV max NA NECK: No hypermetabolic lymph nodes in the neck. Incidental CT findings: None. CHEST: Interval  progression of consolidative opacity in the parahilar right lung, corresponding to the anterior aspect of the treatment bed (compare image 29/7 today to image 62/5 of the 12/13/2021 exam). This region is markedly hypermetabolic with SUV max = 54.0. Scarring seen posterior to the right hilum and inferior to the right hilum is not hypermetabolic. 8 mm short axis subcarinal lymph node is stable since 12/13/2021 with low level hypermetabolism ( SUV max = 3.3). No hypermetabolic left hilar or axillary lymphadenopathy. No hypermetabolic pulmonary nodule or mass in the left lung. Incidental CT findings: Tiny right pleural effusion is new in the interval. Centrilobular and paraseptal emphysema evident. ABDOMEN/PELVIS: No abnormal hypermetabolic activity within the liver, pancreas, adrenal glands, or spleen. No hypermetabolic lymph nodes in the abdomen or pelvis. Incidental CT findings: Insert fatty liver There is mild atherosclerotic calcification of the abdominal aorta without aneurysm. Small right groin hernia contains only fat. SKELETON: No focal hypermetabolic activity to suggest skeletal metastasis. Incidental CT findings: No worrisome lytic or sclerotic osseous abnormality. IMPRESSION: 1. Interval progression of consolidative opacity in the parahilar right lung, corresponding to the anterior aspect of the treatment bed. This region is markedly hypermetabolic with SUV max = 08.6. Imaging features are consistent with local recurrence. 2. Stable 8 mm short axis subcarinal lymph node with low level hypermetabolism. Metastatic involvement cannot be excluded. 3. New tiny right pleural effusion. 4. No evidence for hypermetabolic metastatic disease in the neck, abdomen, or pelvis. 5. Hepatic steatosis. 6. Small right groin hernia contains only fat. 7. Aortic Atherosclerosis (ICD10-I70.0) and Emphysema (ICD10-J43.9). Electronically Signed   By: Misty Stanley M.D.   On: 04/21/2022 08:11   MR Brain W Wo Contrast  Result  Date: 04/13/2022 CLINICAL DATA:  Malignant neoplasm of unspecified part of unspecified bronchus or lung (HCC) C34.90 (ICD-10-CM). Metastatic disease evaluation; Recurrent stage III lung cancer, restaging. EXAM: MRI HEAD WITHOUT AND WITH CONTRAST TECHNIQUE: Multiplanar, multiecho pulse sequences of the brain and surrounding structures were obtained without and with intravenous contrast. CONTRAST:  59mL GADAVIST GADOBUTROL 1 MMOL/ML IV SOLN COMPARISON:  MRI of the brain January 25, 2021. FINDINGS: Brain: No acute infarction, hemorrhage, hydrocephalus, extra-axial collection or mass lesion. No focus of abnormal contrast enhancement identified. 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 Major flow voids are maintained. Skull and upper cervical spine: Normal marrow signal. Sinuses/Orbits: Mild mucosal thickening scattered throughout the paranasal sinuses. The orbits are maintained. Other: None. IMPRESSION: 1. No evidence of intracranial metastatic disease. 2. A 5 mm anterior communicating artery aneurysm. Neuro interventional consultation recommended. Electronically Signed   By: Pedro Earls M.D.   On: 04/13/2022 16:46     ASSESSMENT AND PLAN: This is a very pleasant 61 years old white male recently diagnosed with stage IIIb (T3, N2, M0) non-small cell lung cancer, squamous cell carcinoma presented with large right upper lobe perihilar mass with right hilar and subcarinal lymphadenopathy diagnosed in April  2022. The patient underwent a course of concurrent chemoradiation with weekly carboplatin and paclitaxel status post 8 cycles.  The patient tolerated his treatment well except for fatigue. His scan showed partial response with improvement in the right hilar mass as well as resolution of the right upper lobe lung nodule. The patient started on treatment with consolidation immunotherapy with Imfinzi 1500 Mg IV every 4 weeks status post 3 cycles.  He has been tolerating the  treatment well except for the worsening cough and shortness of breath. 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  He has repeat bronchoscopy by Dr. Valeta Harms that showed evidence for residual/recurrent squamous cell carcinoma of the lung in the right upper lobe.  The patient also had a PET scan and MRI performed recently.  I personally and independently reviewed the images and discussed the result and showed the the images to the patient and his wife. His scan showed worsening and progressive hypermetabolic activity in the right upper lobe/hilar mass concerning for progressive disease locally. His brain MRI showed a small aneurysm and he was referred to IR for evaluation. I had a lengthy discussion with the patient about his treatment option and give him the option of consideration of additional course of radiotherapy to the locally recurrent disease in the right lung by Dr. Sondra Come but if the patient is not a candidate for this treatment, I may consider him for systemic chemotherapy +/- immunotherapy.  He has some concern about the immunotherapy because of the previous complication and the immunotherapy mediated pneumonitis. I will see the patient back for follow-up visit in around 3 months for evaluation and repeat CT scan of the chest for restaging of his disease.  I may see him sooner if he is not a candidate for radiotherapy. For pain management I gave him refill of morphine sulfate. He was advised to call immediately if he has any other concerning symptoms in the interval. For the history of pulmonary embolism, the patient will continue his current treatment with Xarelto.  The patient voices understanding of current disease status  and treatment options and is in agreement with the current care plan.  All questions were answered. The patient knows to call the clinic with any problems, questions or concerns. We can certainly see the patient much sooner if necessary.  The total time spent in the appointment was 30 minutes.  Disclaimer: This note was dictated with voice recognition software. Similar sounding words can inadvertently be transcribed and may not be corrected upon review.

## 2022-04-22 NOTE — Consult Note (Signed)
Chief Complaint: Patient was seen in consultation today for brain aneurysm  Referring Physician(s): Heilingoetter,Cassandra L  Supervising Physician: Pedro Earls  Patient Status: [facility] - Out-pt  History of Present Illness: Paul Mclean is a 61 y.o. male with a past medical history of recurrent non-small cell lung cancer, squamous cell carcinoma and left lower extremity deep venous thrombosis on Xarelto. He underwent an MRI of that brian for re staging on 04/12/2022 that showed no evidence of intracranial metastatic disease. 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 He comes today for discussion of MRI findings  and management options. He denies family history of ruptured brain aneurysm. He does not currently smoke but has a history of smoking (~44 pack/year), having quit about 20 years ago.  Past Medical History:  Diagnosis Date   Atrial flutter ([place])    question of brief aflutter 02/28/22 in setting of acute pericarditis (not captured on ECG)   Cancer ([place])    right lung cancer   DVT (deep venous thrombosis) ([place]) 02/27/2021   LLE partially occlusive DVT 02/25/21, started on Xarelto   Dyspnea    due to cancer per pt   History of radiation therapy 11/12/2020   right lung  09/30/2020-11/12/2020  Dr Gery Pray   Hypothyroidism    Thyroid disease     Past Surgical History:  Procedure Laterality Date   APPENDECTOMY     BRONCHIAL BIOPSY  09/01/2020   Procedure: BRONCHIAL BIOPSIES;  Surgeon: Garner Nash, DO;  Location: [place];  Service: Pulmonary;;   BRONCHIAL BIOPSY  04/05/2022   Procedure: BRONCHIAL BIOPSIES;  Surgeon: Garner Nash, DO;  Location: [place] [facility];  Service: Pulmonary;;   BRONCHIAL BRUSHINGS  09/01/2020   Procedure: BRONCHIAL BRUSHINGS;  Surgeon: Garner Nash, DO;  Location: [place];  Service: Pulmonary;;   BRONCHIAL NEEDLE ASPIRATION BIOPSY  09/01/2020   Procedure: BRONCHIAL NEEDLE ASPIRATION  BIOPSIES;  Surgeon: Garner Nash, DO;  Location: [place] [facility];  Service: Pulmonary;;   BRONCHIAL WASHINGS  09/01/2020   Procedure: BRONCHIAL WASHINGS;  Surgeon: Garner Nash, DO;  Location: [place] [facility];  Service: Pulmonary;;   FINE NEEDLE ASPIRATION  04/05/2022   Procedure: FINE NEEDLE ASPIRATION (FNA) LINEAR;  Surgeon: Garner Nash, DO;  Location: [place];  Service: Pulmonary;;   HERNIA REPAIR     INCISION AND DRAINAGE ABSCESS N/A 08/25/2017   Procedure: INCISION AND DRAINAGE perineal  ABSCESS;  Surgeon: Festus Aloe, MD;  Location: [facility];  Service: Urology;  Laterality: N/A;   VIDEO BRONCHOSCOPY WITH ENDOBRONCHIAL ULTRASOUND N/A 09/01/2020   Procedure: VIDEO BRONCHOSCOPY WITH ENDOBRONCHIAL ULTRASOUND;  Surgeon: Garner Nash, DO;  Location: [place];  Service: Pulmonary;  Laterality: N/A;   VIDEO BRONCHOSCOPY WITH ENDOBRONCHIAL ULTRASOUND Right 04/05/2022   Procedure: VIDEO BRONCHOSCOPY WITH ENDOBRONCHIAL ULTRASOUND;  Surgeon: Garner Nash, DO;  Location: [place];  Service: Pulmonary;  Laterality: Right;    Allergies: Patient has no known allergies.  Medications: Prior to Admission medications   Medication Sig Start Date End Date Taking? Authorizing Provider  albuterol (VENTOLIN HFA) 108 (90 Base) MCG/ACT inhaler Inhale 2 puffs into the lungs every 6 (six) hours as needed for wheezing or shortness of breath. 02/12/21   [place] Donnamarie Poag, DO  Budeson-Glycopyrrol-Formoterol (BREZTRI AEROSPHERE) 160-9-4.8 MCG/ACT AERO Inhale 2 puffs into the lungs in the morning and at bedtime. 08/23/21   Icard, Octavio Graves, DO  ibuprofen (ADVIL) 200 MG tablet Take 200 mg by mouth in  the morning and at bedtime.    [provider]  ipratropium-albuterol (DUONEB) 0.5-2.5 (3) MG/3ML SOLN Inhale 3 mLs into the lungs every 6 (six) hours as needed (wheezing/shortness of breath).    [provider]  levothyroxine (SYNTHROID) 112 MCG tablet Take 112 mcg by mouth daily  before breakfast. 06/22/20   [provider]  morphine (MSIR) 15 MG tablet Take 1 tablet (15 mg total) by mouth every 6 (six) hours as needed (pain.). 04/22/22   Curt Bears, MD  predniSONE (DELTASONE) 10 MG tablet Take 5 mg by mouth in the morning.    [provider]  XARELTO 20 MG TABS tablet TAKE 1 TABLET(20 MG) BY MOUTH DAILY WITH SUPPER Patient taking differently: Take 20 mg by mouth in the morning. 07/17/21   Curt Bears, MD     Family History  Problem Relation Age of Onset   Cancer Mother     Social History   Socioeconomic History   Marital status: Single    Spouse name: Not on file   Number of children: Not on file   Years of education: Not on file   Highest education level: Not on file  Occupational History   Not on file  Tobacco Use   Smoking status: Former    Packs/day: 2.00    Years: 30.00    Total pack years: 60.00    Types: Cigarettes    Quit date: 2008    Years since quitting: 15.9   Smokeless tobacco: Never  Vaping Use   Vaping Use: Never used  Substance and Sexual Activity   Alcohol use: Yes    Alcohol/week: 1.0 - 2.0 standard drink of alcohol    Types: 1 - 2 Cans of beer per week    Comment: few beers everyday   Drug use: Never   Sexual activity: Not on file  Other Topics Concern   Not on file  Social History Narrative   Not on file   Social Determinants of Health   Financial Resource Strain: Low Risk  (09/25/2020)   Overall Financial Resource Strain (CARDIA)    Difficulty of Paying Living Expenses: Not hard at all  Food Insecurity: No Food Insecurity (09/25/2020)   Hunger Vital Sign    Worried About Running Out of Food in the Last Year: Never true    [place] in the Last Year: Never true  Transportation Needs: No Transportation Needs (09/25/2020)   PRAPARE - Hydrologist (Medical): No    Lack of Transportation (Non-Medical): No  Physical Activity: Not on file  Stress: No Stress  Concern Present (09/25/2020)   [place]    Feeling of Stress : Not at all  Social Connections: Unknown (09/25/2020)   Social Connection and Isolation Panel [NHANES]    Frequency of Communication with Friends and Family: More than three times a week    Frequency of Social Gatherings with Friends and Family: More than three times a week    Attends Religious Services: Not on Advertising copywriter or Organizations: Not on file    Attends Archivist Meetings: Not on file    Marital Status: Living with partner     Review of Systems: A 12 point ROS discussed and pertinent positives are indicated in the HPI above.  All other systems are negative.  Review of Systems  Vital Signs: There were no vitals taken for  this visit.  Physical Exam Constitutional:      Appearance: Normal appearance.  Neurological:     General: No focal deficit present.     Mental Status: He is alert and oriented to person, place, and time.          Imaging: NM PET Image Restag (PS) Skull Base To Thigh  Result Date: 04/21/2022 CLINICAL DATA:  Subsequent treatment strategy for non-small-cell lung cancer. EXAM: NUCLEAR MEDICINE PET SKULL BASE TO THIGH TECHNIQUE: 10.6 mCi F-18 FDG was injected intravenously. Full-ring PET imaging was performed from the skull base to thigh after the radiotracer. CT data was obtained and used for attenuation correction and anatomic localization. Fasting blood glucose: 149 mg/dl COMPARISON:  Abdomen CT 03/16/2022. Chest CT 12/13/2021. PET-CT 09/02/2020 FINDINGS: Mediastinal blood pool activity: SUV max 2.7 Liver activity: SUV max NA NECK: No hypermetabolic lymph nodes in the neck. Incidental CT findings: None. CHEST: Interval progression of consolidative opacity in the parahilar right lung, corresponding to the anterior aspect of the treatment bed (compare image 29/7 today to image 62/5 of the 12/13/2021  exam). This region is markedly hypermetabolic with SUV max = 73.2. Scarring seen posterior to the right hilum and inferior to the right hilum is not hypermetabolic. 8 mm short axis subcarinal lymph node is stable since 12/13/2021 with low level hypermetabolism ( SUV max = 3.3). No hypermetabolic left hilar or axillary lymphadenopathy. No hypermetabolic pulmonary nodule or mass in the left lung. Incidental CT findings: Tiny right pleural effusion is new in the interval. Centrilobular and paraseptal emphysema evident. ABDOMEN/PELVIS: No abnormal hypermetabolic activity within the liver, pancreas, adrenal glands, or spleen. No hypermetabolic lymph nodes in the abdomen or pelvis. Incidental CT findings: Insert fatty liver There is mild atherosclerotic calcification of the abdominal aorta without aneurysm. Small right groin hernia contains only fat. SKELETON: No focal hypermetabolic activity to suggest skeletal metastasis. Incidental CT findings: No worrisome lytic or sclerotic osseous abnormality. IMPRESSION: 1. Interval progression of consolidative opacity in the parahilar right lung, corresponding to the anterior aspect of the treatment bed. This region is markedly hypermetabolic with SUV max = 20.2. Imaging features are consistent with local recurrence. 2. Stable 8 mm short axis subcarinal lymph node with low level hypermetabolism. Metastatic involvement cannot be excluded. 3. New tiny right pleural effusion. 4. No evidence for hypermetabolic metastatic disease in the neck, abdomen, or pelvis. 5. Hepatic steatosis. 6. Small right groin hernia contains only fat. 7. Aortic Atherosclerosis (ICD10-I70.0) and Emphysema (ICD10-J43.9). Electronically Signed   By: Misty Stanley M.D.   On: 04/21/2022 08:11   MR Brain W Wo Contrast  Result Date: 04/13/2022 CLINICAL DATA:  Malignant neoplasm of unspecified part of unspecified bronchus or lung (HCC) C34.90 (ICD-10-CM). Metastatic disease evaluation; Recurrent stage III lung  cancer, restaging. EXAM: MRI HEAD WITHOUT AND WITH CONTRAST TECHNIQUE: Multiplanar, multiecho pulse sequences of the brain and surrounding structures were obtained without and with intravenous contrast. CONTRAST:  38mL GADAVIST GADOBUTROL 1 MMOL/ML IV SOLN COMPARISON:  MRI of the brain January 25, 2021. FINDINGS: Brain: No acute infarction, hemorrhage, hydrocephalus, extra-axial collection or mass lesion. No focus of abnormal contrast enhancement identified. 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 Major flow voids are maintained. Skull and upper cervical spine: Normal marrow signal. Sinuses/Orbits: Mild mucosal thickening scattered throughout the paranasal sinuses. The orbits are maintained. Other: None. IMPRESSION: 1. No evidence of intracranial metastatic disease. 2. A 5 mm anterior communicating artery aneurysm. Neuro interventional consultation recommended. Electronically Signed  By: Pedro Earls M.D.   On: 04/13/2022 16:46    Labs:  CBC: Recent Labs    05/12/21 1026 08/16/21 1356 04/05/22 0813 04/13/22 0837  WBC 6.1 5.8 7.9 5.6  HGB 13.6 15.6 13.6 15.5  HCT 40.9 45.4 41.5 45.3  PLT 204 210 229 221    COAGS: No results for input(s): "INR", "APTT" in the last 8760 hours.  BMP: Recent Labs    05/12/21 1026 08/16/21 1356 04/05/22 0813 04/13/22 0837  NA 137 136 135 137  K 4.4 3.9 3.6 3.7  CL 101 103 98 100  CO2 30 25 26 29   GLUCOSE 113* 104* 140* 138*  BUN 13 17 12 13   CALCIUM 9.6 9.6 8.6* 9.8  CREATININE 0.84 0.80 0.96 0.80  GFRNONAA >60 >60 >60 >60    LIVER FUNCTION TESTS: Recent Labs    05/12/21 1026 08/16/21 1356 04/13/22 0837  BILITOT 1.2 0.7 0.5  AST 22 31 26   ALT 25 46* 37  ALKPHOS 67 79 102  PROT 7.8 7.9 7.7  ALBUMIN 3.8 3.9 3.7    TUMOR MARKERS: No results for input(s): "AFPTM", "CEA", "CA199", "CHROMGRNA" in the last 8760 hours.  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  The ventral history of brain aneurysm and management options were reviewed with Paul Mclean and his wife, including medical management, open neurosurgical clipping and endovascular embolization.  Given his recent diagnosis of recurrent lung cancer, it is important to have an evaluation with his medical oncologist for any treatment needs and prognosis.  I will reach out to Dr. Julien Nordmann for his input.  After his oncological evaluation, we can discuss management option that would be more appropriate.  Signs and symptoms of a rupture brain aneurysm were also reviewed and he was instructed to call 911 if he ever suspects he is having a subarachnoid hemorrhage.  We will reach out to Paul Mclean once we hear back from his oncologist.   Thank you for this interesting consult.  I greatly enjoyed meeting Paul Mclean and look forward to participating in their care.  A copy of this report was sent to the requesting provider on this date.  Electronically Signed: Pedro Earls, MD 04/22/2022, 3:40 PM   I spent a total of   25 minutes    in face to face in clinical consultation, greater than 50% of which was counseling/coordinating care for brain aneurysm.

## 2022-04-26 NOTE — Progress Notes (Signed)
Thoracic Location of Tumor / Histology:  Stage IIIb (T3, N2, M0) non-small cell lung cancer   Patient presented with symptoms of: per Dr. Worthy Flank 04/22/22 note: "The patient has been on observation since that time but recently started having more chest pain as well as shortness of breath and cough.  He has repeat bronchoscopy by Dr. Valeta Harms that showed evidence for residual/recurrent squamous cell carcinoma of the lung in the right upper lobe.  The patient also had a PET scan and MRI performed recently.Marland KitchenMarland KitchenHis scan showed worsening and progressive hypermetabolic activity in the right upper lobe/hilar mass concerning for progressive disease locally. His brain MRI showed a small aneurysm and he was referred to IR for evaluation"  Biopsies revealed:  04/05/2022 A. LUNG, RIGHT LOWER LOBE, BIOPSY:  -  Well to moderately differentiated squamous cell carcinoma, see comment  B. LUNG, RIGHT UPPER LOBE, ENDOBRONCHIAL BIOPSY:  - Fragments of benign bronchial wall   Tobacco/Marijuana/Snuff/ETOH use: {:18581}  Past/Anticipated interventions by cardiothoracic surgery, if any:  04/05/2022 --Dr. Leory Plowman Icard Flexible video fiberoptic bronchoscopy with endobronchial ultrasound and biopsies   09/01/2021 --Dr. Leory Plowman Icard Flexible video fiberoptic bronchoscopy with endobronchial ultrasound and biopsies   Past/Anticipated interventions by medical oncology, if any:  Under care of Dr. Curt Bears  04/22/2022 --PRIOR THERAPY:  Concurrent chemoradiation with weekly carboplatin for AUC of 2 and paclitaxel 45 Mg/M2.  Status post 8 cycles.  Last dose was given November 10, 2020. Consolidation treatment with immunotherapy with Imfinzi 1500 Mg IV every 4 weeks.  First dose December 14, 2020.  Status post 3 cycles.  This was discontinued secondary to intolerance with highly suspicious immunotherapy mediated pneumonitis. --CURRENT THERAPY  I had a lengthy discussion with the patient about his treatment option and give  him the option of consideration of additional course of radiotherapy to the locally recurrent disease in the right lung by Dr. Sondra Come  if the patient is not a candidate for this treatment, I may consider him for systemic chemotherapy +/- immunotherapy.   He has some concern about the immunotherapy because of the previous complication and the immunotherapy mediated pneumonitis. I will see the patient back for follow-up visit in around 3 months for evaluation and repeat CT scan of the chest for restaging of his disease.   I may see him sooner if he is not a candidate for radiotherapy. For pain management I gave him refill of morphine sulfate. For the history of pulmonary embolism, the patient will continue his current treatment with Xarelto.  Signs/Symptoms Weight changes, if any: {:18581} Respiratory complaints, if any: {:18581} Hemoptysis, if any: {:18581} Pain issues, if any:  {:18581}  SAFETY ISSUES: Prior radiation? Yes: 09/30/2020 through 11/12/2020 Site Technique Total Dose (Gy) Dose per Fx (Gy) Completed Fx Beam Energies  Lung, Right: Lung_Rt 3D 60/60 2 30/30 6X, 10X   Pacemaker/ICD? No  Possible current pregnancy? N/A Is the patient on methotrexate? No  Current Complaints / other details:  ***

## 2022-04-26 NOTE — Progress Notes (Signed)
Radiation Oncology         [phone] ________________________________  Outpatient Re-Consultation  Name: Paul Mclean MRN: [id number]  Date: 04/27/2022  DOB: 1961-02-09  ST:[id number], Paul Manner, MD  Paul Bears, MD   REFERRING PHYSICIAN: Curt Bears, MD  DIAGNOSIS: There were no encounter diagnoses.  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  History of a stage IIIb non-small cell lung cancer, squamous cell carcinoma diagnosed in April 2022: s/p concurrent chemoradiation with weekly carboplatin and paclitaxel completed in July of 2022    Cancer Staging  Stage III squamous cell carcinoma of right lung (HCC) Staging form: Lung, AJCC 8th Edition - Clinical: Stage IIIB (cT3, cN2, cM0) - Signed by Paul Bears, MD on 09/08/2020  Interval Since Last Radiation: 1 year, 5 months, and 13 days    Intent: Curative  Radiation Treatment Dates: 09/30/2020 through 11/12/2020 Site Technique Total Dose (Gy) Dose per Fx (Gy) Completed Fx Beam Energies  Lung, Right: Lung_Rt 3D 60/60 2 30/30 6X, 10X    HISTORY OF PRESENT ILLNESS::Paul Mclean is a 61 y.o. male who is accompanied by ***. 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 I last met with the patient on 12/24/20 for follow up of radiation the right lung. To review, the patient also completed concurrent chemotherapy at that time consisting of weekly carboplatin and paclitaxel, s/p 8 cycles with Dr. Julien Mclean. Post treatment imaging at the time showed a partial response with improvement in the right hilar mass as well as resolution of the right upper lobe lung nodule. Following concurrent treatment, the patient was started on consolidation immunotherapy with Imfinzi on 12/14/20 per Dr. Julien Mclean. The patient initially tolerated immunotherapy well except for the worsening cough and shortness of breath. 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 Since then, the patient has continued on observation with Dr. Julien Mclean. (A follow-up chest CT performed in 08/16/2021 showed significant improvement in the immunotherapy mediated pneumonitis and no evidence for disease progression).  In recent history, the patient presented the [facility] ED located in [place], [place] (located near [place]) on 02/28/22 with acute onset of pleuritic chest pain, rapid heart beat, and shortness of breath. The patient also endorsed having right sided rib pain since this past September. CTA performed in the ED demonstrated a mass-like consolidation within the right hilum with progressive attenuation of the pulmonary arteries within the right upper and middle lobes. New regions of complete luminal attenuation were also appreciated. He was initially admitted for management of tachycardia with concern for MI, and he was given nebulizer treatments, IV steroids, IV antibiotics for questionable pneumonia. However, further work-up showed findings more consistent with pericarditis. His symptoms improved and he was discharged the following day after further management (see encounter note for full hospital course).      Given CT findings, the patient's PCP reached out to Dr. Valeta Mclean and Dr. Julien Mclean requesting evaluation to rule out recurrent disease. 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 (CT also showed evidence of hepatic stenosis but no focal hepatic lesions or bile duct dilation).  Following discussion  with Dr. Valeta Mclean, the patient opted to proceed with bronchoscopy with biopsies on 04/05/22. FNA of the RUL revealed  malignant cells consistent with squamous cell carcinoma. Biopsy of the RLL revealed findings consistent with well to moderately differentiated squamous cell carcinoma. (Right upper lobe endobrachial biopsy showed fragments of benign bronchial wall).     - [employer] testing came back showing a TPS score of 7%  MRI of the brain on 04/12/22 showed no evidence of intracranial metastatic disease. A 5 mm anterior communicating artery aneurysm was noted, and will be addressed by interventional neuroradiology (referral already placed by medical oncology).   Restaging PET scan on 04/20/22 demonstrates interval progression of the consolidative opacity in the parahilar right lung, which corresponds to the anterior aspect of the treatment bed. The region is markedly hypermetabolic with an SUV max NG29.5, and has features consistent with local recurrence. PET also shows stability of an 8 mm short axis subcarinal lymph node with low level hypermetabolism (metastatic involvement cannot be excluded), and a new tiny right sided pleural effusion. PET otherwise shows no evidence concerning for hypermetabolic metastatic disease in the neck, abdomen, or pelvis.  Accordingly, the patient followed up with Dr. Julien Mclean on 04/22/22. During this visit, the patient was noted to be doing relatively well denied any concerns except for the persistent cough and aching pain in the central right chest area. Treatment options discussed included consideration of radiotherapy to the locally recurrent disease in the right lung, vs systemic chemotherapy +/- immunotherapy if he is not a candidate for further radiation. The patient will return to Dr. Julien Mclean in 3 months for re-evaluation and repeat CT scan of the chest for restaging of his disease.   His current pain management regimen consists of morphine sulfate. Dr. Julien Mclean refilled this for him on 04/22/22.   Of note: the patient's case was presented at the tumor board held on  04/15/22.   PAST MEDICAL HISTORY:  Past Medical History:  Diagnosis Date   Atrial flutter ([facility])    question of brief aflutter 02/28/22 in setting of acute pericarditis (not captured on ECG)   Cancer ([facility])    right lung cancer   DVT (deep venous thrombosis) ([facility]) 02/27/2021   LLE partially occlusive DVT 02/25/21, started on Xarelto   Dyspnea    due to cancer per pt   History of radiation therapy 11/12/2020   right lung  09/30/2020-11/12/2020  Dr Gery Pray   Hypothyroidism    Thyroid disease     PAST SURGICAL HISTORY: Past Surgical History:  Procedure Laterality Date   APPENDECTOMY     BRONCHIAL BIOPSY  09/01/2020   Procedure: BRONCHIAL BIOPSIES;  Surgeon: Garner Nash, DO;  Location: [facility];  Service: Pulmonary;;   BRONCHIAL BIOPSY  04/05/2022   Procedure: BRONCHIAL BIOPSIES;  Surgeon: Garner Nash, DO;  Location: [facility];  Service: Pulmonary;;   BRONCHIAL BRUSHINGS  09/01/2020   Procedure: BRONCHIAL BRUSHINGS;  Surgeon: Garner Nash, DO;  Location: [facility];  Service: Pulmonary;;   BRONCHIAL NEEDLE ASPIRATION BIOPSY  09/01/2020   Procedure: BRONCHIAL NEEDLE ASPIRATION BIOPSIES;  Surgeon: Garner Nash, DO;  Location: [facility];  Service: Pulmonary;;   BRONCHIAL WASHINGS  09/01/2020   Procedure: BRONCHIAL WASHINGS;  Surgeon: Garner Nash, DO;  Location: [facility];  Service: Pulmonary;;   FINE NEEDLE ASPIRATION  04/05/2022   Procedure: FINE NEEDLE ASPIRATION (FNA) LINEAR;  Surgeon: Garner Nash, DO;  Location: [facility];  Service: Pulmonary;;   HERNIA REPAIR  INCISION AND DRAINAGE ABSCESS N/A 08/25/2017   Procedure: INCISION AND DRAINAGE perineal  ABSCESS;  Surgeon: Festus Aloe, MD;  Location: [facility];  Service: Urology;  Laterality: N/A;   VIDEO BRONCHOSCOPY WITH ENDOBRONCHIAL ULTRASOUND N/A 09/01/2020   Procedure: VIDEO BRONCHOSCOPY WITH ENDOBRONCHIAL ULTRASOUND;  Surgeon: Garner Nash, DO;  Location: [facility];  Service:  Pulmonary;  Laterality: N/A;   VIDEO BRONCHOSCOPY WITH ENDOBRONCHIAL ULTRASOUND Right 04/05/2022   Procedure: VIDEO BRONCHOSCOPY WITH ENDOBRONCHIAL ULTRASOUND;  Surgeon: Garner Nash, DO;  Location: [facility];  Service: Pulmonary;  Laterality: Right;    FAMILY HISTORY:  Family History  Problem Relation Age of Onset   Cancer Mother     SOCIAL HISTORY:  Social History   Tobacco Use   Smoking status: Former    Packs/day: 2.00    Years: 30.00    Total pack years: 60.00    Types: Cigarettes    Quit date: 2008    Years since quitting: 15.9   Smokeless tobacco: Never  Vaping Use   Vaping Use: Never used  Substance Use Topics   Alcohol use: Yes    Alcohol/week: 1.0 - 2.0 standard drink of alcohol    Types: 1 - 2 Cans of beer per week    Comment: few beers everyday   Drug use: Never    ALLERGIES: No Known Allergies  MEDICATIONS:  Current Outpatient Medications  Medication Sig Dispense Refill   albuterol (VENTOLIN HFA) 108 (90 Base) MCG/ACT inhaler Inhale 2 puffs into the lungs every 6 (six) hours as needed for wheezing or shortness of breath. 8 g 2   Budeson-Glycopyrrol-Formoterol (BREZTRI AEROSPHERE) 160-9-4.8 MCG/ACT AERO Inhale 2 puffs into the lungs in the morning and at bedtime. 10.7 g 11   ibuprofen (ADVIL) 200 MG tablet Take 200 mg by mouth in the morning and at bedtime.     ipratropium-albuterol (DUONEB) 0.5-2.5 (3) MG/3ML SOLN Inhale 3 mLs into the lungs every 6 (six) hours as needed (wheezing/shortness of breath).     levothyroxine (SYNTHROID) 112 MCG tablet Take 112 mcg by mouth daily before breakfast.     morphine (MSIR) 15 MG tablet Take 1 tablet (15 mg total) by mouth every 6 (six) hours as needed (pain.). 40 tablet 0   predniSONE (DELTASONE) 10 MG tablet Take 5 mg by mouth in the morning.     XARELTO 20 MG TABS tablet TAKE 1 TABLET(20 MG) BY MOUTH DAILY WITH SUPPER (Patient taking differently: Take 20 mg by mouth in the morning.) 30 tablet 3   No current  facility-administered medications for this encounter.    REVIEW OF SYSTEMS:  A 10+ POINT REVIEW OF SYSTEMS WAS OBTAINED including neurology, dermatology, psychiatry, cardiac, respiratory, lymph, extremities, GI, GU, musculoskeletal, constitutional, reproductive, HEENT. ***   PHYSICAL EXAM:  vitals were not taken for this visit.   General: Alert and oriented, in no acute distress HEENT: Head is normocephalic. Extraocular movements are intact. Oropharynx is clear. Neck: Neck is supple, no palpable cervical or supraclavicular lymphadenopathy. Heart: Regular in rate and rhythm with no murmurs, rubs, or gallops. Chest: Clear to auscultation bilaterally, with no rhonchi, wheezes, or rales. Abdomen: Soft, nontender, nondistended, with no rigidity or guarding. Extremities: No cyanosis or edema. Lymphatics: see Neck Exam Skin: No concerning lesions. Musculoskeletal: symmetric strength and muscle tone throughout. Neurologic: Cranial nerves II through XII are grossly intact. No obvious focalities. Speech is fluent. Coordination is intact. Psychiatric: Judgment and insight are intact. Affect is appropriate. ***  ECOG = ***  0 - Asymptomatic (Fully active, able to carry on all predisease activities without restriction)  1 - Symptomatic but completely ambulatory (Restricted in physically strenuous activity but ambulatory and able to carry out work of a light or sedentary nature. For example, light housework, office work)  2 - Symptomatic, <50% in bed during the day (Ambulatory and capable of all self care but unable to carry out any work activities. Up and about more than 50% of waking hours)  3 - Symptomatic, >50% in bed, but not bedbound (Capable of only limited self-care, confined to bed or chair 50% or more of waking hours)  4 - Bedbound (Completely disabled. Cannot carry on any self-care. Totally confined to bed or chair)  5 - Death   Eustace Pen MM, Creech RH, Tormey DC, et al. [phone]). "Toxicity  and response criteria of the [facility]". [place] Oncol. 5 (6): 649-55  LABORATORY DATA:  Lab Results  Component Value Date   WBC 5.6 04/13/2022   HGB 15.5 04/13/2022   HCT 45.3 04/13/2022   MCV 90.8 04/13/2022   PLT 221 04/13/2022   NEUTROABS 4.3 04/13/2022   Lab Results  Component Value Date   NA 137 04/13/2022   K 3.7 04/13/2022   CL 100 04/13/2022   CO2 29 04/13/2022   GLUCOSE 138 (H) 04/13/2022   BUN 13 04/13/2022   CREATININE 0.80 04/13/2022   CALCIUM 9.8 04/13/2022      RADIOGRAPHY: NM PET Image Restag (PS) Skull Base To Thigh  Result Date: 04/21/2022 CLINICAL DATA:  Subsequent treatment strategy for non-small-cell lung cancer. EXAM: NUCLEAR MEDICINE PET SKULL BASE TO THIGH TECHNIQUE: 10.6 mCi F-18 FDG was injected intravenously. Full-ring PET imaging was performed from the skull base to thigh after the radiotracer. CT data was obtained and used for attenuation correction and anatomic localization. Fasting blood glucose: 149 mg/dl COMPARISON:  Abdomen CT 03/16/2022. Chest CT 12/13/2021. PET-CT 09/02/2020 FINDINGS: Mediastinal blood pool activity: SUV max 2.7 Liver activity: SUV max NA NECK: No hypermetabolic lymph nodes in the neck. Incidental CT findings: None. CHEST: Interval progression of consolidative opacity in the parahilar right lung, corresponding to the anterior aspect of the treatment bed (compare image 29/7 today to image 62/5 of the 12/13/2021 exam). This region is markedly hypermetabolic with SUV max = 62.0. Scarring seen posterior to the right hilum and inferior to the right hilum is not hypermetabolic. 8 mm short axis subcarinal lymph node is stable since 12/13/2021 with low level hypermetabolism ( SUV max = 3.3). No hypermetabolic left hilar or axillary lymphadenopathy. No hypermetabolic pulmonary nodule or mass in the left lung. Incidental CT findings: Tiny right pleural effusion is new in the interval. Centrilobular and paraseptal  emphysema evident. ABDOMEN/PELVIS: No abnormal hypermetabolic activity within the liver, pancreas, adrenal glands, or spleen. No hypermetabolic lymph nodes in the abdomen or pelvis. Incidental CT findings: Insert fatty liver There is mild atherosclerotic calcification of the abdominal aorta without aneurysm. Small right groin hernia contains only fat. SKELETON: No focal hypermetabolic activity to suggest skeletal metastasis. Incidental CT findings: No worrisome lytic or sclerotic osseous abnormality. IMPRESSION: 1. Interval progression of consolidative opacity in the parahilar right lung, corresponding to the anterior aspect of the treatment bed. This region is markedly hypermetabolic with SUV max = 35.5. Imaging features are consistent with local recurrence. 2. Stable 8 mm short axis subcarinal lymph node with low level hypermetabolism. Metastatic involvement cannot be excluded. 3. New tiny right pleural effusion. 4. No evidence for hypermetabolic  metastatic disease in the neck, abdomen, or pelvis. 5. Hepatic steatosis. 6. Small right groin hernia contains only fat. 7. Aortic Atherosclerosis (ICD10-I70.0) and Emphysema (ICD10-J43.9). Electronically Signed   By: Misty Stanley M.D.   On: 04/21/2022 08:11   MR Brain W Wo Contrast  Result Date: 04/13/2022 CLINICAL DATA:  Malignant neoplasm of unspecified part of unspecified bronchus or lung (HCC) C34.90 (ICD-10-CM). Metastatic disease evaluation; Recurrent stage III lung cancer, restaging. EXAM: MRI HEAD WITHOUT AND WITH CONTRAST TECHNIQUE: Multiplanar, multiecho pulse sequences of the brain and surrounding structures were obtained without and with intravenous contrast. CONTRAST:  75m GADAVIST GADOBUTROL 1 MMOL/ML IV SOLN COMPARISON:  MRI of the brain January 25, 2021. FINDINGS: Brain: No acute infarction, hemorrhage, hydrocephalus, extra-axial collection or mass lesion. No focus of abnormal contrast enhancement identified. Vascular: There is a 5 mm anterior  communicating artery aneurysm. Major flow voids are maintained. Skull and upper cervical spine: Normal marrow signal. Sinuses/Orbits: Mild mucosal thickening scattered throughout the paranasal sinuses. The orbits are maintained. Other: None. IMPRESSION: 1. No evidence of intracranial metastatic disease. 2. A 5 mm anterior communicating artery aneurysm. Neuro interventional consultation recommended. Electronically Signed   By: KPedro EarlsM.D.   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  We discussed the natural history of *** and general treatment, highlighting the role of radiotherapy in the management.  We discussed the available radiation techniques, and focused on the details of logistics and delivery.  We reviewed the anticipated acute and late sequelae associated with radiation in this setting.  The patient was encouraged to ask questions that I answered to the best of my ability. *** A patient consent form was discussed and signed.  We retained a copy for our records.  The patient would like to proceed with radiation and will be scheduled for CT simulation.  PLAN: ***    *** minutes of total time was spent for this patient encounter, including preparation, face-to-face counseling with the patient and coordination of care, physical exam, and documentation of the encounter.   ------------------------------------------------  JBlair Promise PhD, MD  This document serves as a record of services personally performed by JGery Pray MD. It was created on his behalf by ERoney Mans a trained medical scribe. The creation of this record is based on  the scribe's personal observations and the provider's statements to them. This document has been checked and approved by the attending provider.

## 2022-04-27 ENCOUNTER — Encounter: Payer: Self-pay | Admitting: Radiation Oncology

## 2022-04-27 ENCOUNTER — Other Ambulatory Visit: Payer: Self-pay

## 2022-04-27 ENCOUNTER — Ambulatory Visit
Admission: RE | Admit: 2022-04-27 | Discharge: 2022-04-27 | Disposition: A | Discharge status: Home / Self Care | Payer: Managed Care, Other (non HMO) | Source: Ambulatory Visit | Attending: Radiation Oncology | Admitting: Radiation Oncology

## 2022-04-27 VITALS — BP 153/99 | HR 98 | Temp 96.1°F | Resp 18 | Ht 69.0 in | Wt 214.0 lb

## 2022-04-27 DIAGNOSIS — Z86718 Personal history of other venous thrombosis and embolism: Secondary | ICD-10-CM | POA: Insufficient documentation

## 2022-04-27 DIAGNOSIS — Z7901 Long term (current) use of anticoagulants: Secondary | ICD-10-CM | POA: Diagnosis not present

## 2022-04-27 DIAGNOSIS — I480 Paroxysmal atrial fibrillation: Secondary | ICD-10-CM | POA: Insufficient documentation

## 2022-04-27 DIAGNOSIS — C3491 Malignant neoplasm of unspecified part of right bronchus or lung: Secondary | ICD-10-CM | POA: Diagnosis present

## 2022-04-27 DIAGNOSIS — Z7952 Long term (current) use of systemic steroids: Secondary | ICD-10-CM | POA: Insufficient documentation

## 2022-04-27 DIAGNOSIS — I671 Cerebral aneurysm, nonruptured: Secondary | ICD-10-CM | POA: Diagnosis not present

## 2022-04-27 DIAGNOSIS — E039 Hypothyroidism, unspecified: Secondary | ICD-10-CM | POA: Diagnosis not present

## 2022-04-27 DIAGNOSIS — Z9221 Personal history of antineoplastic chemotherapy: Secondary | ICD-10-CM | POA: Insufficient documentation

## 2022-04-27 DIAGNOSIS — Z923 Personal history of irradiation: Secondary | ICD-10-CM | POA: Diagnosis not present

## 2022-04-27 DIAGNOSIS — Z87891 Personal history of nicotine dependence: Secondary | ICD-10-CM | POA: Diagnosis not present

## 2022-05-11 ENCOUNTER — Inpatient Hospital Stay: Payer: Managed Care, Other (non HMO) | Attending: Physician Assistant

## 2022-05-11 ENCOUNTER — Other Ambulatory Visit: Payer: Self-pay | Admitting: Medical Oncology

## 2022-05-11 ENCOUNTER — Inpatient Hospital Stay (HOSPITAL_BASED_OUTPATIENT_CLINIC_OR_DEPARTMENT_OTHER): Payer: Managed Care, Other (non HMO) | Admitting: Internal Medicine

## 2022-05-11 ENCOUNTER — Other Ambulatory Visit: Payer: Self-pay

## 2022-05-11 VITALS — BP 160/115 | HR 95 | Temp 97.6°F | Resp 15 | Wt 209.7 lb

## 2022-05-11 DIAGNOSIS — C3431 Malignant neoplasm of lower lobe, right bronchus or lung: Secondary | ICD-10-CM | POA: Diagnosis present

## 2022-05-11 DIAGNOSIS — Z9221 Personal history of antineoplastic chemotherapy: Secondary | ICD-10-CM | POA: Insufficient documentation

## 2022-05-11 DIAGNOSIS — Z79891 Long term (current) use of opiate analgesic: Secondary | ICD-10-CM | POA: Diagnosis not present

## 2022-05-11 DIAGNOSIS — Z5111 Encounter for antineoplastic chemotherapy: Secondary | ICD-10-CM | POA: Diagnosis not present

## 2022-05-11 DIAGNOSIS — C3491 Malignant neoplasm of unspecified part of right bronchus or lung: Secondary | ICD-10-CM | POA: Diagnosis not present

## 2022-05-11 DIAGNOSIS — C3490 Malignant neoplasm of unspecified part of unspecified bronchus or lung: Secondary | ICD-10-CM

## 2022-05-11 DIAGNOSIS — Z7901 Long term (current) use of anticoagulants: Secondary | ICD-10-CM | POA: Insufficient documentation

## 2022-05-11 DIAGNOSIS — Z5189 Encounter for other specified aftercare: Secondary | ICD-10-CM | POA: Diagnosis not present

## 2022-05-11 DIAGNOSIS — Z923 Personal history of irradiation: Secondary | ICD-10-CM | POA: Insufficient documentation

## 2022-05-11 DIAGNOSIS — E039 Hypothyroidism, unspecified: Secondary | ICD-10-CM | POA: Diagnosis not present

## 2022-05-11 DIAGNOSIS — M7989 Other specified soft tissue disorders: Secondary | ICD-10-CM | POA: Insufficient documentation

## 2022-05-11 DIAGNOSIS — J90 Pleural effusion, not elsewhere classified: Secondary | ICD-10-CM | POA: Insufficient documentation

## 2022-05-11 DIAGNOSIS — Z86711 Personal history of pulmonary embolism: Secondary | ICD-10-CM | POA: Diagnosis not present

## 2022-05-11 DIAGNOSIS — G893 Neoplasm related pain (acute) (chronic): Secondary | ICD-10-CM | POA: Insufficient documentation

## 2022-05-11 DIAGNOSIS — J432 Centrilobular emphysema: Secondary | ICD-10-CM | POA: Diagnosis not present

## 2022-05-11 DIAGNOSIS — Z86718 Personal history of other venous thrombosis and embolism: Secondary | ICD-10-CM | POA: Insufficient documentation

## 2022-05-11 LAB — CMP (CANCER CENTER ONLY)
ALT: 36 U/L (ref 0–44)
AST: 27 U/L (ref 15–41)
Albumin: 3.5 g/dL (ref 3.5–5.0)
Alkaline Phosphatase: 85 U/L (ref 38–126)
Anion gap: 9 (ref 5–15)
BUN: 13 mg/dL (ref 6–20)
CO2: 30 mmol/L (ref 22–32)
Calcium: 9.2 mg/dL (ref 8.9–10.3)
Chloride: 97 mmol/L — ABNORMAL LOW (ref 98–111)
Creatinine: 0.82 mg/dL (ref 0.61–1.24)
GFR, Estimated: >60 mL/min (ref >60)
Glucose, Bld: 107 mg/dL — ABNORMAL HIGH (ref 70–99)
Potassium: 4.5 mmol/L (ref 3.5–5.1)
Sodium: 136 mmol/L (ref 135–145)
Total Bilirubin: 0.8 mg/dL (ref 0.3–1.2)
Total Protein: 8.2 g/dL — ABNORMAL HIGH (ref 6.5–8.1)

## 2022-05-11 LAB — CBC WITH DIFFERENTIAL (CANCER CENTER ONLY)
Abs Immature Granulocytes: 0.04 10*3/uL (ref 0.00–0.07)
Basophils Absolute: 0 10*3/uL (ref 0.0–0.1)
Basophils Relative: 0 %
Eosinophils Absolute: 0.1 10*3/uL (ref 0.0–0.5)
Eosinophils Relative: 1 %
HCT: 43.8 % (ref 39.0–52.0)
Hemoglobin: 15.3 g/dL (ref 13.0–17.0)
Immature Granulocytes: 1 %
Lymphocytes Relative: 10 %
Lymphs Abs: 0.8 10*3/uL (ref 0.7–4.0)
MCH: 30.9 pg (ref 26.0–34.0)
MCHC: 34.9 g/dL (ref 30.0–36.0)
MCV: 88.5 fL (ref 80.0–100.0)
Monocytes Absolute: 0.9 10*3/uL (ref 0.1–1.0)
Monocytes Relative: 12 %
Neutro Abs: 5.7 10*3/uL (ref 1.7–7.7)
Neutrophils Relative %: 76 %
Platelet Count: 280 10*3/uL (ref 150–400)
RBC: 4.95 MIL/uL (ref 4.22–5.81)
RDW: 12.5 % (ref 11.5–15.5)
WBC Count: 7.5 10*3/uL (ref 4.0–10.5)
nRBC: 0 % (ref 0.0–0.2)

## 2022-05-11 MED ORDER — FUROSEMIDE 20 MG PO TABS: ORAL_TABLET | ORAL | 0 refills | Status: DC

## 2022-05-11 MED ORDER — MORPHINE SULFATE ER 30 MG PO TBCR: 30.0000 mg | EXTENDED_RELEASE_TABLET | Freq: Two times a day (BID) | ORAL | 0 refills | Status: DC

## 2022-05-11 NOTE — Progress Notes (Signed)
[facility] [phone]   [phone]  OFFICE PROGRESS NOTE  [name], MD [address] [place] [address]  DIAGNOSIS:  1) recurrent non-small cell lung cancer, squamous cell carcinoma initially diagnosed as stage IIIb (T3, N2, M0) non-small cell lung cancer, squamous cell carcinoma presented with large right upper lobe perihilar mass with right hilar and subcarinal lymphadenopathy diagnosed in April 2022. 2) left lower extremity deep venous thrombosis diagnosed in October 2022.  Currently on treatment with Xarelto.  PRIOR THERAPY:  1) Concurrent chemoradiation with weekly carboplatin for AUC of 2 and paclitaxel 45 Mg/M2.  Status post 8 cycles.  Last dose was given November 10, 2020. 2) Consolidation treatment with immunotherapy with Imfinzi 1500 Mg IV every 4 weeks.  First dose December 14, 2020.  Status post 3 cycles.  This was discontinued secondary to intolerance with highly suspicious immunotherapy mediated pneumonitis.  CURRENT THERAPY: Systemic chemotherapy with carboplatin for AUC of 5 and paclitaxel 175 Mg/M2 with Neulasta support every 3 weeks.  INTERVAL HISTORY: Paul Mclean 62 y.o. male returns to the clinic today for follow-up visit accompanied by his wife and daughter.  The patient continues to complain of pain on the right side of the chest.  He is currently on MS IR with no control of his pain management.  He denied having any shortness of breath except with exertion with mild cough and no hemoptysis.  He has no nausea, vomiting, diarrhea or constipation.  He has no headache or visual changes.  He denied having any fever or chills.  He continues to have swelling of the lower extremities.  He was seen by Dr. Sondra Come for consideration of palliative radiotherapy to the local disease recurrence in the right side of the chest but the patient was not a candidate for repeat radiotherapy and he was referred back to me today for discussion of  alternative treatment options.    MEDICAL HISTORY: Past Medical History:  Diagnosis Date   Atrial flutter ([place])    question of brief aflutter 02/28/22 in setting of acute pericarditis (not captured on ECG)   Cancer ([place])    right lung cancer   DVT (deep venous thrombosis) ([place]) 02/27/2021   LLE partially occlusive DVT 02/25/21, started on Xarelto   Dyspnea    due to cancer per pt   History of radiation therapy 11/12/2020   right lung  09/30/2020-11/12/2020  Dr Gery Pray   Hypothyroidism    Thyroid disease     ALLERGIES:  has No Known Allergies.  MEDICATIONS:  Current Outpatient Medications  Medication Sig Dispense Refill   albuterol (VENTOLIN HFA) 108 (90 Base) MCG/ACT inhaler Inhale 2 puffs into the lungs every 6 (six) hours as needed for wheezing or shortness of breath. 8 g 2   Budeson-Glycopyrrol-Formoterol (BREZTRI AEROSPHERE) 160-9-4.8 MCG/ACT AERO Inhale 2 puffs into the lungs in the morning and at bedtime. 10.7 g 11   ibuprofen (ADVIL) 200 MG tablet Take 200 mg by mouth in the morning and at bedtime.     ipratropium-albuterol (DUONEB) 0.5-2.5 (3) MG/3ML SOLN Inhale 3 mLs into the lungs every 6 (six) hours as needed (wheezing/shortness of breath).     levothyroxine (SYNTHROID) 112 MCG tablet Take 112 mcg by mouth daily before breakfast.     morphine (MSIR) 15 MG tablet Take 1 tablet (15 mg total) by mouth every 6 (six) hours as needed (pain.). 40 tablet 0   predniSONE (DELTASONE) 10 MG tablet Take  5 mg by mouth in the morning.     XARELTO 20 MG TABS tablet TAKE 1 TABLET(20 MG) BY MOUTH DAILY WITH SUPPER (Patient taking differently: Take 20 mg by mouth in the morning.) 30 tablet 3   No current facility-administered medications for this visit.    SURGICAL HISTORY:  Past Surgical History:  Procedure Laterality Date   APPENDECTOMY     BRONCHIAL BIOPSY  09/01/2020   Procedure: BRONCHIAL BIOPSIES;  Surgeon: Garner Nash, DO;  Location: [facility];  Service: Pulmonary;;    BRONCHIAL BIOPSY  04/05/2022   Procedure: BRONCHIAL BIOPSIES;  Surgeon: Garner Nash, DO;  Location: [facility];  Service: Pulmonary;;   BRONCHIAL BRUSHINGS  09/01/2020   Procedure: BRONCHIAL BRUSHINGS;  Surgeon: Garner Nash, DO;  Location: [facility];  Service: Pulmonary;;   BRONCHIAL NEEDLE ASPIRATION BIOPSY  09/01/2020   Procedure: BRONCHIAL NEEDLE ASPIRATION BIOPSIES;  Surgeon: Garner Nash, DO;  Location: [facility];  Service: Pulmonary;;   BRONCHIAL WASHINGS  09/01/2020   Procedure: BRONCHIAL WASHINGS;  Surgeon: Garner Nash, DO;  Location: [facility];  Service: Pulmonary;;   FINE NEEDLE ASPIRATION  04/05/2022   Procedure: FINE NEEDLE ASPIRATION (FNA) LINEAR;  Surgeon: Garner Nash, DO;  Location: [facility];  Service: Pulmonary;;   HERNIA REPAIR     INCISION AND DRAINAGE ABSCESS N/A 08/25/2017   Procedure: INCISION AND DRAINAGE perineal  ABSCESS;  Surgeon: Festus Aloe, MD;  Location: [facility];  Service: Urology;  Laterality: N/A;   VIDEO BRONCHOSCOPY WITH ENDOBRONCHIAL ULTRASOUND N/A 09/01/2020   Procedure: VIDEO BRONCHOSCOPY WITH ENDOBRONCHIAL ULTRASOUND;  Surgeon: Garner Nash, DO;  Location: [facility];  Service: Pulmonary;  Laterality: N/A;   VIDEO BRONCHOSCOPY WITH ENDOBRONCHIAL ULTRASOUND Right 04/05/2022   Procedure: VIDEO BRONCHOSCOPY WITH ENDOBRONCHIAL ULTRASOUND;  Surgeon: Garner Nash, DO;  Location: [facility];  Service: Pulmonary;  Laterality: Right;    REVIEW OF SYSTEMS:  Constitutional: positive for fatigue Eyes: negative Ears, nose, mouth, throat, and face: negative Respiratory: positive for cough, dyspnea on exertion, and pleurisy/chest pain Cardiovascular: negative Gastrointestinal: negative Genitourinary:negative Integument/breast: negative Hematologic/lymphatic: negative Musculoskeletal:negative Neurological: negative Behavioral/Psych: negative Endocrine: negative Allergic/Immunologic: negative   PHYSICAL  EXAMINATION: General appearance: alert, cooperative, fatigued, and no distress Head: Normocephalic, without obvious abnormality, atraumatic Neck: no adenopathy, no JVD, supple, symmetrical, trachea midline, and thyroid not enlarged, symmetric, no tenderness/mass/nodules Lymph nodes: Cervical, supraclavicular, and axillary nodes normal. Resp: rales RUL and wheezes RUL Back: symmetric, no curvature. ROM normal. No CVA tenderness. Cardio: regular rate and rhythm, S1, S2 normal, no murmur, click, rub or gallop GI: soft, non-tender; bowel sounds normal; no masses,  no organomegaly Extremities: extremities normal, atraumatic, no cyanosis or edema Neurologic: Alert and oriented X 3, normal strength and tone. Normal symmetric reflexes. Normal coordination and gait  ECOG PERFORMANCE STATUS: 1 - Symptomatic but completely ambulatory  Blood pressure (!) 160/115, pulse 95, temperature 97.6 F (36.4 C), temperature source Oral, resp. rate 15, weight 209 lb 11.2 oz (95.1 kg), SpO2 96 %.  LABORATORY DATA: Lab Results  Component Value Date   WBC 7.5 05/11/2022   HGB 15.3 05/11/2022   HCT 43.8 05/11/2022   MCV 88.5 05/11/2022   PLT 280 05/11/2022      Chemistry      Component Value Date/Time   NA 137 04/13/2022 0837   K 3.7 04/13/2022 0837   CL 100 04/13/2022 0837   CO2 29 04/13/2022 0837   BUN 13 04/13/2022 0837   CREATININE 0.80 04/13/2022  [id number]      Component Value Date/Time   CALCIUM 9.8 04/13/2022 0837   ALKPHOS 102 04/13/2022 0837   AST 26 04/13/2022 0837   ALT 37 04/13/2022 0837   BILITOT 0.5 04/13/2022 0837       RADIOGRAPHIC STUDIES: NM PET Image Restag (PS) Skull Base To Thigh  Result Date: 04/21/2022 CLINICAL DATA:  Subsequent treatment strategy for non-small-cell lung cancer. EXAM: NUCLEAR MEDICINE PET SKULL BASE TO THIGH TECHNIQUE: 10.6 mCi F-18 FDG was injected intravenously. Full-ring PET imaging was performed from the skull base to thigh after the radiotracer. CT data  was obtained and used for attenuation correction and anatomic localization. Fasting blood glucose: 149 mg/dl COMPARISON:  Abdomen CT 03/16/2022. Chest CT 12/13/2021. PET-CT 09/02/2020 FINDINGS: Mediastinal blood pool activity: SUV max 2.7 Liver activity: SUV max NA NECK: No hypermetabolic lymph nodes in the neck. Incidental CT findings: None. CHEST: Interval progression of consolidative opacity in the parahilar right lung, corresponding to the anterior aspect of the treatment bed (compare image 29/7 today to image 62/5 of the 12/13/2021 exam). This region is markedly hypermetabolic with SUV max = 71.0. Scarring seen posterior to the right hilum and inferior to the right hilum is not hypermetabolic. 8 mm short axis subcarinal lymph node is stable since 12/13/2021 with low level hypermetabolism ( SUV max = 3.3). No hypermetabolic left hilar or axillary lymphadenopathy. No hypermetabolic pulmonary nodule or mass in the left lung. Incidental CT findings: Tiny right pleural effusion is new in the interval. Centrilobular and paraseptal emphysema evident. ABDOMEN/PELVIS: No abnormal hypermetabolic activity within the liver, pancreas, adrenal glands, or spleen. No hypermetabolic lymph nodes in the abdomen or pelvis. Incidental CT findings: Insert fatty liver There is mild atherosclerotic calcification of the abdominal aorta without aneurysm. Small right groin hernia contains only fat. SKELETON: No focal hypermetabolic activity to suggest skeletal metastasis. Incidental CT findings: No worrisome lytic or sclerotic osseous abnormality. IMPRESSION: 1. Interval progression of consolidative opacity in the parahilar right lung, corresponding to the anterior aspect of the treatment bed. This region is markedly hypermetabolic with SUV max = 62.6. Imaging features are consistent with local recurrence. 2. Stable 8 mm short axis subcarinal lymph node with low level hypermetabolism. Metastatic involvement cannot be excluded. 3. New  tiny right pleural effusion. 4. No evidence for hypermetabolic metastatic disease in the neck, abdomen, or pelvis. 5. Hepatic steatosis. 6. Small right groin hernia contains only fat. 7. Aortic Atherosclerosis (ICD10-I70.0) and Emphysema (ICD10-J43.9). Electronically Signed   By: Misty Stanley M.D.   On: 04/21/2022 08:11   MR Brain W Wo Contrast  Result Date: 04/13/2022 CLINICAL DATA:  Malignant neoplasm of unspecified part of unspecified bronchus or lung (HCC) C34.90 (ICD-10-CM). Metastatic disease evaluation; Recurrent stage III lung cancer, restaging. EXAM: MRI HEAD WITHOUT AND WITH CONTRAST TECHNIQUE: Multiplanar, multiecho pulse sequences of the brain and surrounding structures were obtained without and with intravenous contrast. CONTRAST:  86mL GADAVIST GADOBUTROL 1 MMOL/ML IV SOLN COMPARISON:  MRI of the brain January 25, 2021. FINDINGS: Brain: No acute infarction, hemorrhage, hydrocephalus, extra-axial collection or mass lesion. No focus of abnormal contrast enhancement identified. 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 Major flow voids are maintained. Skull and upper cervical spine: Normal marrow signal. Sinuses/Orbits: Mild mucosal thickening scattered throughout the paranasal sinuses. The orbits are maintained. Other: None. IMPRESSION: 1. No evidence of intracranial metastatic disease. 2. A 5 mm anterior communicating artery aneurysm. Neuro interventional consultation recommended. Electronically Signed   By: Erven Colla  de Sindy Messing M.D.   On: 04/13/2022 16:46     ASSESSMENT AND PLAN: This is a very pleasant 62 years old white male recently diagnosed with stage IIIb (T3, N2, M0) non-small cell lung cancer, squamous cell carcinoma presented with large right upper lobe perihilar mass with right hilar and subcarinal lymphadenopathy diagnosed in April 2022. The patient underwent a course of concurrent chemoradiation with weekly carboplatin and paclitaxel status post 8  cycles.  The patient tolerated his treatment well except for fatigue. His scan showed partial response with improvement in the right hilar mass as well as resolution of the right upper lobe lung nodule. The patient started on treatment with consolidation immunotherapy with Imfinzi 1500 Mg IV every 4 weeks status post 3 cycles.  He has been tolerating the treatment well except for the worsening cough and shortness of breath. 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  He has repeat bronchoscopy by Dr. Valeta Harms that showed evidence for residual/recurrent squamous cell carcinoma of the lung in the right upper lobe.  The patient also had a PET scan and MRI performed recently.  I personally and independently reviewed the images and discussed the result and showed the the images to the patient and his wife. His scan showed worsening and progressive hypermetabolic activity in the right upper lobe/hilar mass concerning for progressive disease locally. He was seen by Dr. Sondra Come for consideration of palliative radiotherapy to the recurrent disease in the right lung but Dr. Sondra Come did not feel that he can offer the patient any additional radiation because of the high-dose he received in the past. I had a lengthy discussion with the patient and his family today about his current condition and treatment options. I gave the patient the option of palliative care and hospice referral with expected life expectancy of around 3 to 6 months versus consideration of systemic chemotherapy with carboplatin for AUC of 5 and paclitaxel 175 Mg/M2 with Neulasta support with a life expectancy of 1-2 years.  He is not a good candidate for additional immunotherapy to  the chemotherapy because of the previous immunotherapy mediated pneumonitis.  This could be reconsidered in the future if we are running out of options.  After discussion with the patient and his family he would like to proceed with the treatment with chemotherapy.  I discussed with him the adverse effect of this treatment including but not limited to alopecia, myelosuppression, nausea and vomiting, peripheral neuropathy, liver or renal dysfunction. He is expected to start the first dose of this treatment next week. I will call his pharmacy with prescription for Compazine 10 mg p.o. every 6 hours as needed for nausea. For the pain management, I will start him on MS Contin 30 mg p.o. every 12 hours and he will continue with MS IR 15 mg for breakthrough pain.  I will also refer him to the palliative care clinic for management of his pain issues. For the swelling of the lower extremities, I will give him prescription for Lasix 20 mg p.o. daily as needed for the swelling and he will need to reach out to his primary care physician for additional recommendation. For the hypertension he is not on any blood pressure medication and I recommended for him to  reach out to Dr. Willey Blade for evaluation of this condition if he has persistent elevation of the blood pressure at home. For the history of pulmonary embolism, he will continue his current treatment with Xarelto. The patient will come back for follow-up visit in 2 weeks for evaluation and management of any adverse effect of his treatment. He was advised to call immediately if he has any other concerning symptoms in the interval. All questions were answered. The patient knows to call the clinic with any problems, questions or concerns. We can certainly see the patient much sooner if necessary.  The total time spent in the appointment was 35 minutes.  Disclaimer: This note was dictated with voice recognition software. Similar sounding words can inadvertently be  transcribed and may not be corrected upon review.

## 2022-05-11 NOTE — Progress Notes (Signed)
DISCONTINUE ON PATHWAY REGIMEN - Non-Small Cell Lung     A cycle is every 28 days:     Durvalumab   **Always confirm dose/schedule in your pharmacy ordering system**  REASON: Disease Progression PRIOR TREATMENT: [id number]: Durvalumab 1,500 mg q28 Days x up to 12 Months TREATMENT RESPONSE: Partial Response (PR)  START OFF PATHWAY REGIMEN - Non-Small Cell Lung   [id number]:Carboplatin AUC=6 IV D1 + Paclitaxel 200 mg/m2 IV D1 q21 Days:   A cycle is every 21 days:     Paclitaxel      Carboplatin   **Always confirm dose/schedule in your pharmacy ordering system**  Patient Characteristics: Local Recurrence Therapeutic Status: Local Recurrence Intent of Therapy: Non-Curative / Palliative Intent, Discussed with Patient

## 2022-05-12 ENCOUNTER — Telehealth: Payer: Self-pay

## 2022-05-12 ENCOUNTER — Other Ambulatory Visit: Payer: Self-pay

## 2022-05-12 NOTE — Telephone Encounter (Signed)
Called pt to advise that he is scheduled with Lexine Baton NP for new pt palliative appointment on 05/18/22 @ 8:45 AM.

## 2022-05-13 ENCOUNTER — Other Ambulatory Visit: Payer: Self-pay

## 2022-05-13 ENCOUNTER — Other Ambulatory Visit: Payer: Self-pay | Admitting: Physician Assistant

## 2022-05-13 DIAGNOSIS — C3491 Malignant neoplasm of unspecified part of right bronchus or lung: Secondary | ICD-10-CM

## 2022-05-15 ENCOUNTER — Other Ambulatory Visit: Payer: Self-pay

## 2022-05-17 ENCOUNTER — Other Ambulatory Visit: Payer: Self-pay | Admitting: Internal Medicine

## 2022-05-17 DIAGNOSIS — C3491 Malignant neoplasm of unspecified part of right bronchus or lung: Secondary | ICD-10-CM

## 2022-05-17 MED FILL — Dexamethasone Sodium Phosphate Inj 100 MG/10ML: INTRAMUSCULAR | Qty: 1 | Status: AC

## 2022-05-17 MED FILL — Fosaprepitant Dimeglumine For IV Infusion 150 MG (Base Eq): INTRAVENOUS | Qty: 5 | Status: AC

## 2022-05-18 ENCOUNTER — Inpatient Hospital Stay: Payer: Managed Care, Other (non HMO)

## 2022-05-18 ENCOUNTER — Inpatient Hospital Stay (HOSPITAL_BASED_OUTPATIENT_CLINIC_OR_DEPARTMENT_OTHER): Payer: Managed Care, Other (non HMO) | Admitting: Nurse Practitioner

## 2022-05-18 ENCOUNTER — Other Ambulatory Visit: Payer: Self-pay | Admitting: *Deleted

## 2022-05-18 ENCOUNTER — Other Ambulatory Visit: Payer: Managed Care, Other (non HMO)

## 2022-05-18 ENCOUNTER — Encounter: Payer: Self-pay | Admitting: Nurse Practitioner

## 2022-05-18 ENCOUNTER — Other Ambulatory Visit: Payer: Self-pay

## 2022-05-18 ENCOUNTER — Other Ambulatory Visit: Payer: Self-pay | Admitting: Neuroradiology

## 2022-05-18 ENCOUNTER — Telehealth (HOSPITAL_COMMUNITY): Payer: Self-pay

## 2022-05-18 VITALS — BP 125/92 | HR 94 | Resp 20

## 2022-05-18 VITALS — BP 143/93 | HR 102 | Temp 97.8°F | Resp 15 | Wt 208.2 lb

## 2022-05-18 DIAGNOSIS — C3491 Malignant neoplasm of unspecified part of right bronchus or lung: Secondary | ICD-10-CM

## 2022-05-18 DIAGNOSIS — M792 Neuralgia and neuritis, unspecified: Secondary | ICD-10-CM | POA: Diagnosis not present

## 2022-05-18 DIAGNOSIS — I729 Aneurysm of unspecified site: Secondary | ICD-10-CM

## 2022-05-18 DIAGNOSIS — G893 Neoplasm related pain (acute) (chronic): Secondary | ICD-10-CM

## 2022-05-18 DIAGNOSIS — Z7189 Other specified counseling: Secondary | ICD-10-CM

## 2022-05-18 DIAGNOSIS — K5903 Drug induced constipation: Secondary | ICD-10-CM

## 2022-05-18 DIAGNOSIS — Z5111 Encounter for antineoplastic chemotherapy: Secondary | ICD-10-CM | POA: Diagnosis not present

## 2022-05-18 DIAGNOSIS — R1013 Epigastric pain: Secondary | ICD-10-CM

## 2022-05-18 LAB — CMP (CANCER CENTER ONLY)
ALT: 33 U/L (ref 0–44)
AST: 26 U/L (ref 15–41)
Albumin: 3.4 g/dL — ABNORMAL LOW (ref 3.5–5.0)
Alkaline Phosphatase: 84 U/L (ref 38–126)
Anion gap: 5 (ref 5–15)
BUN: 11 mg/dL (ref 6–20)
CO2: 33 mmol/L — ABNORMAL HIGH (ref 22–32)
Calcium: 9.5 mg/dL (ref 8.9–10.3)
Chloride: 96 mmol/L — ABNORMAL LOW (ref 98–111)
Creatinine: 0.82 mg/dL (ref 0.61–1.24)
GFR, Estimated: >60 mL/min (ref >60)
Glucose, Bld: 118 mg/dL — ABNORMAL HIGH (ref 70–99)
Potassium: 3.9 mmol/L (ref 3.5–5.1)
Sodium: 134 mmol/L — ABNORMAL LOW (ref 135–145)
Total Bilirubin: 0.6 mg/dL (ref 0.3–1.2)
Total Protein: 7.4 g/dL (ref 6.5–8.1)

## 2022-05-18 LAB — CBC WITH DIFFERENTIAL (CANCER CENTER ONLY)
Abs Immature Granulocytes: 0.04 10*3/uL (ref 0.00–0.07)
Basophils Absolute: 0 10*3/uL (ref 0.0–0.1)
Basophils Relative: 0 %
Eosinophils Absolute: 0.2 10*3/uL (ref 0.0–0.5)
Eosinophils Relative: 2 %
HCT: 42.3 % (ref 39.0–52.0)
Hemoglobin: 14.7 g/dL (ref 13.0–17.0)
Immature Granulocytes: 1 %
Lymphocytes Relative: 9 %
Lymphs Abs: 0.8 10*3/uL (ref 0.7–4.0)
MCH: 30.2 pg (ref 26.0–34.0)
MCHC: 34.8 g/dL (ref 30.0–36.0)
MCV: 86.9 fL (ref 80.0–100.0)
Monocytes Absolute: 1 10*3/uL (ref 0.1–1.0)
Monocytes Relative: 12 %
Neutro Abs: 6.1 10*3/uL (ref 1.7–7.7)
Neutrophils Relative %: 76 %
Platelet Count: 265 10*3/uL (ref 150–400)
RBC: 4.87 MIL/uL (ref 4.22–5.81)
RDW: 12.5 % (ref 11.5–15.5)
WBC Count: 8 10*3/uL (ref 4.0–10.5)
nRBC: 0 % (ref 0.0–0.2)

## 2022-05-18 MED ORDER — MORPHINE SULFATE ER 30 MG PO TBCR: 30.0000 mg | EXTENDED_RELEASE_TABLET | Freq: Three times a day (TID) | ORAL | 0 refills | Status: DC

## 2022-05-18 MED ORDER — PALONOSETRON HCL INJECTION 0.25 MG/5ML
0.2500 mg | Freq: Once | INTRAVENOUS | Status: AC
  Administered 2022-05-18: 0.25 mg via INTRAVENOUS
  Filled 2022-05-18: qty 5

## 2022-05-18 MED ORDER — PANTOPRAZOLE SODIUM 40 MG PO TBEC: 40.0000 mg | DELAYED_RELEASE_TABLET | Freq: Every day | ORAL | 2 refills | Status: DC

## 2022-05-18 MED ORDER — DIPHENHYDRAMINE HCL 50 MG/ML IJ SOLN
50.0000 mg | Freq: Once | INTRAMUSCULAR | Status: AC
  Administered 2022-05-18: 50 mg via INTRAVENOUS
  Filled 2022-05-18: qty 1

## 2022-05-18 MED ORDER — FAMOTIDINE IN NACL 20-0.9 MG/50ML-% IV SOLN
20.0000 mg | Freq: Once | INTRAVENOUS | Status: AC
  Administered 2022-05-18: 20 mg via INTRAVENOUS
  Filled 2022-05-18: qty 50

## 2022-05-18 MED ORDER — GABAPENTIN 300 MG PO CAPS: 300.0000 mg | ORAL_CAPSULE | Freq: Two times a day (BID) | ORAL | 1 refills | Status: DC

## 2022-05-18 MED ORDER — ONDANSETRON HCL 8 MG PO TABS: 8.0000 mg | ORAL_TABLET | Freq: Three times a day (TID) | ORAL | 1 refills | Status: DC | PRN

## 2022-05-18 MED ORDER — SODIUM CHLORIDE 0.9 % IV SOLN
10.0000 mg | Freq: Once | INTRAVENOUS | Status: AC
  Administered 2022-05-18: 10 mg via INTRAVENOUS
  Filled 2022-05-18: qty 10

## 2022-05-18 MED ORDER — SODIUM CHLORIDE 0.9 % IV SOLN
150.0000 mg | Freq: Once | INTRAVENOUS | Status: AC
  Administered 2022-05-18: 150 mg via INTRAVENOUS
  Filled 2022-05-18: qty 150

## 2022-05-18 MED ORDER — SODIUM CHLORIDE 0.9 % IV SOLN: Freq: Once | INTRAVENOUS | Status: AC

## 2022-05-18 MED ORDER — POLYETHYLENE GLYCOL 3350 17 GM/SCOOP PO POWD: ORAL | 0 refills | Status: DC

## 2022-05-18 MED ORDER — SODIUM CHLORIDE 0.9 % IV SOLN
750.0000 mg | Freq: Once | INTRAVENOUS | Status: AC
  Administered 2022-05-18: 750 mg via INTRAVENOUS
  Filled 2022-05-18: qty 75

## 2022-05-18 MED ORDER — SODIUM CHLORIDE 0.9 % IV SOLN
200.0000 mg/m2 | Freq: Once | INTRAVENOUS | Status: AC
  Administered 2022-05-18: 432 mg via INTRAVENOUS
  Filled 2022-05-18: qty 72

## 2022-05-18 NOTE — Progress Notes (Signed)
[facility]  [phone] [phone]   Name: Paul Mclean Date: 05/18/2022 MRN: [id number]  DOB: [birth year]  Patient Care Team: [name], MD as PCP - General (Internal Medicine)    REASON FOR CONSULTATION: Paul Mclean is a 62 y.o. 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  Palliative ask to see for pain management and goals of care.    SOCIAL HISTORY:     reports that he quit smoking about 16 years ago. His smoking use included cigarettes. He has a 60.00 pack-year smoking history. He has never used smokeless tobacco. He reports current alcohol use of about 1.0 - 2.0 standard drink of alcohol per week. He reports that he does not use drugs.  ADVANCE DIRECTIVES:  Has advanced directives on file (2022)  CODE STATUS: Full code  PAST MEDICAL HISTORY: Past Medical History:  Diagnosis Date   Atrial flutter ([place])    question of brief aflutter 02/28/22 in setting of acute pericarditis (not captured on ECG)   Cancer ([place])    right lung cancer   DVT (deep venous thrombosis) ([place]) 02/27/2021   LLE partially occlusive DVT 02/25/21, started on Xarelto   Dyspnea    due to cancer per pt   History of radiation therapy 11/12/2020   right lung  09/30/2020-11/12/2020  Dr Gery Pray   Hypothyroidism    Thyroid disease     PAST SURGICAL HISTORY:  Past Surgical History:  Procedure Laterality Date   APPENDECTOMY     BRONCHIAL BIOPSY  09/01/2020   Procedure: BRONCHIAL BIOPSIES;  Surgeon: Garner Nash, DO;  Location: [facility];  Service: Pulmonary;;   BRONCHIAL BIOPSY  04/05/2022   Procedure: BRONCHIAL BIOPSIES;  Surgeon: Garner Nash, DO;  Location: [facility];  Service: Pulmonary;;   BRONCHIAL BRUSHINGS  09/01/2020   Procedure: BRONCHIAL BRUSHINGS;  Surgeon: Garner Nash, DO;  Location: [facility];  Service: Pulmonary;;   BRONCHIAL NEEDLE ASPIRATION BIOPSY  09/01/2020   Procedure: BRONCHIAL NEEDLE ASPIRATION BIOPSIES;  Surgeon: Garner Nash, DO;  Location: [facility];  Service: Pulmonary;;   BRONCHIAL WASHINGS  09/01/2020   Procedure: BRONCHIAL WASHINGS;  Surgeon: Garner Nash, DO;  Location: [facility];  Service: Pulmonary;;   FINE NEEDLE ASPIRATION  04/05/2022   Procedure: FINE NEEDLE ASPIRATION (FNA) LINEAR;  Surgeon: Garner Nash, DO;  Location: [facility];  Service: Pulmonary;;   HERNIA REPAIR     INCISION AND DRAINAGE ABSCESS N/A 08/25/2017   Procedure: INCISION AND DRAINAGE perineal  ABSCESS;  Surgeon: Festus Aloe, MD;  Location: [facility];  Service: Urology;  Laterality: N/A;   VIDEO BRONCHOSCOPY WITH ENDOBRONCHIAL ULTRASOUND N/A 09/01/2020   Procedure: VIDEO BRONCHOSCOPY WITH ENDOBRONCHIAL ULTRASOUND;  Surgeon: Garner Nash, DO;  Location: [facility];  Service: Pulmonary;  Laterality: N/A;   VIDEO BRONCHOSCOPY WITH ENDOBRONCHIAL ULTRASOUND Right 04/05/2022   Procedure: VIDEO BRONCHOSCOPY WITH ENDOBRONCHIAL ULTRASOUND;  Surgeon: Garner Nash, DO;  Location: [facility];  Service: Pulmonary;  Laterality: Right;    HEMATOLOGY/ONCOLOGY HISTORY:  Oncology History  Stage III squamous cell carcinoma of right lung ([place])  09/08/2020 Initial Diagnosis   Stage III squamous cell carcinoma of right lung ([place])   09/08/2020 Cancer Staging   Staging form: Lung, AJCC 8th Edition - Clinical: Stage IIIB (cT3, cN2, cM0) - Signed by Julien Nordmann,  Julien Nordmann, MD on 09/08/2020   09/22/2020 - 11/10/2020 Chemotherapy         12/14/2020 - 02/08/2021 Chemotherapy   Patient is on Treatment Plan : LUNG NSCLC Durvalumab q28d     05/18/2022 -  Chemotherapy   Patient is on Treatment Plan : LUNG Carboplatin + Paclitaxel q21d       ALLERGIES:  has No Known Allergies.  MEDICATIONS:  Current Outpatient Medications  Medication Sig Dispense Refill    albuterol (VENTOLIN HFA) 108 (90 Base) MCG/ACT inhaler Inhale 2 puffs into the lungs every 6 (six) hours as needed for wheezing or shortness of breath. 8 g 2   Budeson-Glycopyrrol-Formoterol (BREZTRI AEROSPHERE) 160-9-4.8 MCG/ACT AERO Inhale 2 puffs into the lungs in the morning and at bedtime. 10.7 g 11   furosemide (LASIX) 20 MG tablet 1 tablet daily as needed for the swelling of the lower extremities 20 tablet 0   ibuprofen (ADVIL) 200 MG tablet Take 200 mg by mouth in the morning and at bedtime.     ipratropium-albuterol (DUONEB) 0.5-2.5 (3) MG/3ML SOLN Inhale 3 mLs into the lungs every 6 (six) hours as needed (wheezing/shortness of breath).     levothyroxine (SYNTHROID) 112 MCG tablet Take 112 mcg by mouth daily before breakfast.     morphine (MS CONTIN) 30 MG 12 hr tablet Take 1 tablet (30 mg total) by mouth every 12 (twelve) hours. 60 tablet 0   morphine (MSIR) 15 MG tablet Take 1 tablet (15 mg total) by mouth every 6 (six) hours as needed (pain.). 40 tablet 0   predniSONE (DELTASONE) 10 MG tablet Take 5 mg by mouth in the morning.     XARELTO 20 MG TABS tablet TAKE 1 TABLET(20 MG) BY MOUTH DAILY WITH SUPPER (Patient taking differently: Take 20 mg by mouth in the morning.) 30 tablet 3   No current facility-administered medications for this visit.    VITAL SIGNS: There were no vitals taken for this visit. There were no vitals filed for this visit.  Estimated body mass index is 30.97 kg/m as calculated from the following:   Height as of 04/27/22: 5\' 9"  (1.753 m).   Weight as of 05/11/22: 209 lb 11.2 oz (95.1 kg).  LABS: CBC:    Component Value Date/Time   WBC 7.5 05/11/2022 1351   WBC 7.9 04/05/2022 0813   HGB 15.3 05/11/2022 1351   HCT 43.8 05/11/2022 1351   PLT 280 05/11/2022 1351   MCV 88.5 05/11/2022 1351   NEUTROABS 5.7 05/11/2022 1351   LYMPHSABS 0.8 05/11/2022 1351   MONOABS 0.9 05/11/2022 1351   EOSABS 0.1 05/11/2022 1351   BASOSABS 0.0 05/11/2022 1351   Comprehensive  Metabolic Panel:    Component Value Date/Time   NA 136 05/11/2022 1351   K 4.5 05/11/2022 1351   CL 97 (L) 05/11/2022 1351   CO2 30 05/11/2022 1351   BUN 13 05/11/2022 1351   CREATININE 0.82 05/11/2022 1351   GLUCOSE 107 (H) 05/11/2022 1351   CALCIUM 9.2 05/11/2022 1351   AST 27 05/11/2022 1351   ALT 36 05/11/2022 1351   ALKPHOS 85 05/11/2022 1351   BILITOT 0.8 05/11/2022 1351   PROT 8.2 (H) 05/11/2022 1351   ALBUMIN 3.5 05/11/2022 1351    RADIOGRAPHIC STUDIES: NM PET Image Restag (PS) Skull Base To Thigh  Result Date: 04/21/2022 CLINICAL DATA:  Subsequent treatment strategy for non-small-cell lung cancer. EXAM: NUCLEAR MEDICINE PET SKULL BASE TO THIGH TECHNIQUE: 10.6 mCi F-18 FDG was injected intravenously. Full-ring PET imaging  was performed from the skull base to thigh after the radiotracer. CT data was obtained and used for attenuation correction and anatomic localization. Fasting blood glucose: 149 mg/dl COMPARISON:  Abdomen CT 03/16/2022. Chest CT 12/13/2021. PET-CT 09/02/2020 FINDINGS: Mediastinal blood pool activity: SUV max 2.7 Liver activity: SUV max NA NECK: No hypermetabolic lymph nodes in the neck. Incidental CT findings: None. CHEST: Interval progression of consolidative opacity in the parahilar right lung, corresponding to the anterior aspect of the treatment bed (compare image 29/7 today to image 62/5 of the 12/13/2021 exam). This region is markedly hypermetabolic with SUV max = 78.5. Scarring seen posterior to the right hilum and inferior to the right hilum is not hypermetabolic. 8 mm short axis subcarinal lymph node is stable since 12/13/2021 with low level hypermetabolism ( SUV max = 3.3). No hypermetabolic left hilar or axillary lymphadenopathy. No hypermetabolic pulmonary nodule or mass in the left lung. Incidental CT findings: Tiny right pleural effusion is new in the interval. Centrilobular and paraseptal emphysema evident. ABDOMEN/PELVIS: No abnormal hypermetabolic  activity within the liver, pancreas, adrenal glands, or spleen. No hypermetabolic lymph nodes in the abdomen or pelvis. Incidental CT findings: Insert fatty liver There is mild atherosclerotic calcification of the abdominal aorta without aneurysm. Small right groin hernia contains only fat. SKELETON: No focal hypermetabolic activity to suggest skeletal metastasis. Incidental CT findings: No worrisome lytic or sclerotic osseous abnormality. IMPRESSION: 1. Interval progression of consolidative opacity in the parahilar right lung, corresponding to the anterior aspect of the treatment bed. This region is markedly hypermetabolic with SUV max = 88.5. Imaging features are consistent with local recurrence. 2. Stable 8 mm short axis subcarinal lymph node with low level hypermetabolism. Metastatic involvement cannot be excluded. 3. New tiny right pleural effusion. 4. No evidence for hypermetabolic metastatic disease in the neck, abdomen, or pelvis. 5. Hepatic steatosis. 6. Small right groin hernia contains only fat. 7. Aortic Atherosclerosis (ICD10-I70.0) and Emphysema (ICD10-J43.9). Electronically Signed   By: Misty Stanley M.D.   On: 04/21/2022 08:11    PERFORMANCE STATUS (ECOG) : 1 - Symptomatic but completely ambulatory  Review of Systems  Constitutional:  Positive for activity change, appetite change, fatigue and unexpected weight change.  Musculoskeletal:  Positive for arthralgias.  Unless otherwise noted, a complete review of systems is negative.  Physical Exam General: NAD, ambulatory  Cardiovascular: regular rate and rhythm Pulmonary: normal breathing pattern  Extremities: Lower extremity edema, no joint deformities Skin: no rashes Neurological:AAO x3   IMPRESSION: This is my initial visit with Paul Mclean. He presents to clinic today with his significant other, Ms. Crista Luria and his daughter, Nira Conn. Patient is ambulatory without assistive devices. No acute distress noted. Alert and able to engage  appropriately in discussions.   I introduced myself, [place] RN, and Palliative's role in collaboration with the oncology team. Concept of Palliative Care was introduced as specialized medical care for people and their families living with serious illness.  It focuses on providing relief from the symptoms and stress of a serious illness.  The goal is to improve quality of life for both the patient and the family. Values and goals of care important to patient and family were attempted to be elicited.   Paul Mclean lives in the home with [name]. He has 2 children (son and daughter). Patient is a retired Nature conservation officer. Shares his family is most important to him in addition to his quality of life.   At home patient is ambulatory without assistance.  He  is able to perform most ADLs independently however with some limitations due to fatigue and ongoing pain.  He shares that due to his pain he has a hard time getting comfortable in bed at night which causes him to be up and down.  His daughter has purchased him a Teacher, English as a foreign language. Paul Mclean is used to being active however since recent diagnosis has not been able to engage in activities that he once desired.   He denies nausea, vomiting, or diarrhea. Does have occasional constipation and dyspepsia.   Neoplasm related pain Paul Mclean shares his pain is constant. Pain is in his right chest wall and radiates. Pain increases with activity and certain positions. Nothing makes pain better. He does gain some relief with pain medication. Rates his pain 10/10 at worst which typically is one a day at minimum. Pain decreases to 5-6 with medication. Pain escalates in several hours. Describes pain as stabbing, sharp, constant, throbbing.   We discussed goal of pain management is to hopefully improve his quality of life by decreasing his pain levels. He shares realistically and for the short term gaining a consistent level of 5 or less would be ideal.   We discussed at length his  current regimen: MS Contin 30 mg twice daily and MS IR 15 mg every 4 hours as needed for breakthrough pain. He was started on this regimen 1/3 and endorses some improvement since starting on MS Contin. Given discussions and when pain begins to escalate I have recommended increasing his MS Contin to every 8 hours. He will continue MS IR every 4 hours as needed. Education also provided on the addition to gabapentin at bedtime. Education provided on potential side effects and when to contact office. Patient and family verbalized understanding and appreciation.   We will continue to closely monitor and support .  Decreased appetite/weight loss Paul Mclean has noticed a decrease in his appetite. He shares he may eat something that he desires only to lose interest after eating. Denies nausea and vomiting. Endorses history of indigestion. We discussed restarting Protonix.   Education provided on ways to improve nutrition including increasing protein intake. Encouraged him to start drinking protein supplements 1-2 times daily. He has tried these in the past. Also to try small frequent meals versus 3 large meals daily and snacking when hungry.   Patient's current weight is 208 lbs down from 214lbs on 12/20. He reports occasional constipation. Will have a bowel movement every 2 days or so. We discussed bowel health and recommended including daily Miralax in regimen. Advised he notices stools are loose he may take every other day. He understands he should not go more than 2 days without a bowel movement. We will continue to monitor.   Goals of Care We discussed his current illness and what it means in the larger context of his on-going co-morbidities. Natural disease trajectory and expectations were discussed.  Paul Mclean and his family are remaining hopeful for some stability and most importantly improvement in his quality of life. He is clear in discussions his goal is to continue to treat the treatable allowing him  every opportunity to thrive as long as he can.   I discussed the importance of continued conversation with family and their medical providers regarding overall plan of care and treatment options, ensuring decisions are within the context of the patients values and GOCs.  PLAN: Established therapeutic relationship. Education provided on palliative's role in collaboration with their Oncology/Radiation team. MS Contin 30 mg three times  daily MS IR 15mg  every 4 hours as needed for breakthrough pain Gabapentin 300mg  at bedtime Protonix 40mg  daily Miralax daily for bowel regimen We will continue to closely monitor and assist with symptom management.  I will plan to see patient back in 1-2 weeks in collaboration to other oncology appointments. Patient and family knows to contact office if needed sooner.    Patient expressed understanding and was in agreement with this plan. He also understands that He can call the clinic at any time with any questions, concerns, or complaints.   Thank you for your referral and allowing Palliative to assist in Paul Mclean's care.   Number and complexity of problems addressed: HIGH - 1 or more chronic illnesses with SEVERE exacerbation, progression, or side effects of treatment - advanced cancer, pain. Any controlled substances utilized were prescribed in the context of palliative care.  Time Total: 50 min   Visit consisted of counseling and education dealing with the complex and emotionally intense issues of symptom management and palliative care in the setting of serious and potentially life-threatening illness.Greater than 50%  of this time was spent counseling and coordinating care related to the above assessment and plan.  Signed by: Alda Lea, AGPCNP-BC Palliative Medicine Team/[place]

## 2022-05-18 NOTE — Patient Instructions (Signed)
[facility]  Discharge Instructions: Thank you for choosing [facility] to provide your oncology and hematology care.   If you have a lab appointment with the [facility], please go directly to the [facility] and check in at the registration area.   Wear comfortable clothing and clothing appropriate for easy access to any Portacath or PICC line.   We strive to give you quality time with your provider. You may need to reschedule your appointment if you arrive late (15 or more minutes).  Arriving late affects you and other patients whose appointments are after yours.  Also, if you miss three or more appointments without notifying the office, you may be dismissed from the clinic at the provider's discretion.      For prescription refill requests, have your pharmacy contact our office and allow 72 hours for refills to be completed.    Today you received the following chemotherapy and/or immunotherapy agents Taxol, Carbo      To help prevent nausea and vomiting after your treatment, we encourage you to take your nausea medication as directed.  BELOW ARE SYMPTOMS THAT SHOULD BE REPORTED IMMEDIATELY: *FEVER GREATER THAN 100.4 F (38 C) OR HIGHER *CHILLS OR SWEATING *NAUSEA AND VOMITING THAT IS NOT CONTROLLED WITH YOUR NAUSEA MEDICATION *UNUSUAL SHORTNESS OF BREATH *UNUSUAL BRUISING OR BLEEDING *URINARY PROBLEMS (pain or burning when urinating, or frequent urination) *BOWEL PROBLEMS (unusual diarrhea, constipation, pain near the anus) TENDERNESS IN MOUTH AND THROAT WITH OR WITHOUT PRESENCE OF ULCERS (sore throat, sores in mouth, or a toothache) UNUSUAL RASH, SWELLING OR PAIN  UNUSUAL VAGINAL DISCHARGE OR ITCHING   Items with * indicate a potential emergency and should be followed up as soon as possible or go to the Emergency Department if any problems should occur.  Please show the CHEMOTHERAPY ALERT CARD or IMMUNOTHERAPY ALERT CARD at check-in  to the Emergency Department and triage nurse.  Should you have questions after your visit or need to cancel or reschedule your appointment, please contact [facility]  Dept: [phone]  and follow the prompts.  Office hours are 8:00 a.m. to 4:30 p.m. Monday - Friday. Please note that voicemails left after 4:00 p.m. may not be returned until the following business day.  We are closed weekends and major holidays. You have access to a nurse at all times for urgent questions. Please call the main number to the clinic Dept: [phone] and follow the prompts.   For any non-urgent questions, you may also contact your provider using MyChart. We now offer e-Visits for anyone 29 and older to request care online for non-urgent symptoms. For details visit [email or web address].   Also download the MyChart app! Go to the app store, search "MyChart", open the app, select [facility], and log in with your MyChart username and password. Paclitaxel Injection What is this medication? PACLITAXEL (PAK li TAX el) treats some types of cancer. It works by slowing down the growth of cancer cells. This medicine may be used for other purposes; ask your health care provider or pharmacist if you have questions. COMMON BRAND NAME(S): Onxol, Taxol What should I tell my care team before I take this medication? They need to know if you have any of these conditions: Heart disease Liver disease Low white blood cell levels An unusual or allergic reaction to paclitaxel, other medications, foods, dyes, or preservatives If you or your partner are pregnant or trying to get pregnant Breast-feeding How should  I use this medication? This medication is injected into a vein. It is given by your care team in a hospital or clinic setting. Talk to your care team about the use of this medication in children. While it may be given to children for selected conditions, precautions do apply. Overdosage: If  you think you have taken too much of this medicine contact a poison control center or emergency room at once. NOTE: This medicine is only for you. Do not share this medicine with others. What if I miss a dose? Keep appointments for follow-up doses. It is important not to miss your dose. Call your care team if you are unable to keep an appointment. What may interact with this medication? Do not take this medication with any of the following: Live virus vaccines Other medications may affect the way this medication works. Talk with your care team about all of the medications you take. They may suggest changes to your treatment plan to lower the risk of side effects and to make sure your medications work as intended. This list may not describe all possible interactions. Give your health care provider a list of all the medicines, herbs, non-prescription drugs, or dietary supplements you use. Also tell them if you smoke, drink alcohol, or use illegal drugs. Some items may interact with your medicine. What should I watch for while using this medication? Your condition will be monitored carefully while you are receiving this medication. You may need blood work while taking this medication. This medication may make you feel generally unwell. This is not uncommon as chemotherapy can affect healthy cells as well as cancer cells. Report any side effects. Continue your course of treatment even though you feel ill unless your care team tells you to stop. This medication can cause serious allergic reactions. To reduce the risk, your care team may give you other medications to take before receiving this one. Be sure to follow the directions from your care team. This medication may increase your risk of getting an infection. Call your care team for advice if you get a fever, chills, sore throat, or other symptoms of a cold or flu. Do not treat yourself. Try to avoid being around people who are sick. This medication may  increase your risk to bruise or bleed. Call your care team if you notice any unusual bleeding. Be careful brushing or flossing your teeth or using a toothpick because you may get an infection or bleed more easily. If you have any dental work done, tell your dentist you are receiving this medication. Talk to your care team if you may be pregnant. Serious birth defects can occur if you take this medication during pregnancy. Talk to your care team before breastfeeding. Changes to your treatment plan may be needed. What side effects may I notice from receiving this medication? Side effects that you should report to your care team as soon as possible: Allergic reactions--skin rash, itching, hives, swelling of the face, lips, tongue, or throat Heart rhythm changes--fast or irregular heartbeat, dizziness, feeling faint or lightheaded, chest pain, trouble breathing Increase in blood pressure Infection--fever, chills, cough, sore throat, wounds that don't heal, pain or trouble when passing urine, general feeling of discomfort or being unwell Low blood pressure--dizziness, feeling faint or lightheaded, blurry vision Low red blood cell level--unusual weakness or fatigue, dizziness, headache, trouble breathing Painful swelling, warmth, or redness of the skin, blisters or sores at the infusion site Pain, tingling, or numbness in the hands or feet  Slow heartbeat--dizziness, feeling faint or lightheaded, confusion, trouble breathing, unusual weakness or fatigue Unusual bruising or bleeding Side effects that usually do not require medical attention (report to your care team if they continue or are bothersome): Diarrhea Hair loss Joint pain Loss of appetite Muscle pain Nausea Vomiting This list may not describe all possible side effects. Call your doctor for medical advice about side effects. You may report side effects to FDA at 1-800-FDA-1088. Where should I keep my medication? This medication is given in  a hospital or clinic. It will not be stored at home. NOTE: This sheet is a summary. It may not cover all possible information. If you have questions about this medicine, talk to your doctor, pharmacist, or health care provider.  2023 Elsevier/Gold Standard (2021-08-25 00:00:00) Carboplatin Injection What is this medication? CARBOPLATIN (KAR boe pla tin) treats some types of cancer. It works by slowing down the growth of cancer cells. This medicine may be used for other purposes; ask your health care provider or pharmacist if you have questions. COMMON BRAND NAME(S): Paraplatin What should I tell my care team before I take this medication? They need to know if you have any of these conditions: Blood disorders Hearing problems Kidney disease Recent or ongoing radiation therapy An unusual or allergic reaction to carboplatin, cisplatin, other medications, foods, dyes, or preservatives Pregnant or trying to get pregnant Breast-feeding How should I use this medication? This medication is injected into a vein. It is given by your care team in a hospital or clinic setting. Talk to your care team about the use of this medication in children. Special care may be needed. Overdosage: If you think you have taken too much of this medicine contact a poison control center or emergency room at once. NOTE: This medicine is only for you. Do not share this medicine with others. What if I miss a dose? Keep appointments for follow-up doses. It is important not to miss your dose. Call your care team if you are unable to keep an appointment. What may interact with this medication? Medications for seizures Some antibiotics, such as amikacin, gentamicin, neomycin, streptomycin, tobramycin Vaccines This list may not describe all possible interactions. Give your health care provider a list of all the medicines, herbs, non-prescription drugs, or dietary supplements you use. Also tell them if you smoke, drink  alcohol, or use illegal drugs. Some items may interact with your medicine. What should I watch for while using this medication? Your condition will be monitored carefully while you are receiving this medication. You may need blood work while taking this medication. This medication may make you feel generally unwell. This is not uncommon, as chemotherapy can affect healthy cells as well as cancer cells. Report any side effects. Continue your course of treatment even though you feel ill unless your care team tells you to stop. In some cases, you may be given additional medications to help with side effects. Follow all directions for their use. This medication may increase your risk of getting an infection. Call your care team for advice if you get a fever, chills, sore throat, or other symptoms of a cold or flu. Do not treat yourself. Try to avoid being around people who are sick. Avoid taking medications that contain aspirin, acetaminophen, ibuprofen, naproxen, or ketoprofen unless instructed by your care team. These medications may hide a fever. Be careful brushing or flossing your teeth or using a toothpick because you may get an infection or bleed more easily. If  you have any dental work done, tell your dentist you are receiving this medication. Talk to your care team if you wish to become pregnant or think you might be pregnant. This medication can cause serious birth defects. Talk to your care team about effective forms of contraception. Do not breast-feed while taking this medication. What side effects may I notice from receiving this medication? Side effects that you should report to your care team as soon as possible: Allergic reactions--skin rash, itching, hives, swelling of the face, lips, tongue, or throat Infection--fever, chills, cough, sore throat, wounds that don't heal, pain or trouble when passing urine, general feeling of discomfort or being unwell Low red blood cell level--unusual  weakness or fatigue, dizziness, headache, trouble breathing Pain, tingling, or numbness in the hands or feet, muscle weakness, change in vision, confusion or trouble speaking, loss of balance or coordination, trouble walking, seizures Unusual bruising or bleeding Side effects that usually do not require medical attention (report to your care team if they continue or are bothersome): Hair loss Nausea Unusual weakness or fatigue Vomiting This list may not describe all possible side effects. Call your doctor for medical advice about side effects. You may report side effects to FDA at 1-800-FDA-1088. Where should I keep my medication? This medication is given in a hospital or clinic. It will not be stored at home. NOTE: This sheet is a summary. It may not cover all possible information. If you have questions about this medicine, talk to your doctor, pharmacist, or health care provider.  2023 Elsevier/Gold Standard (2021-08-09 00:00:00)

## 2022-05-18 NOTE — Telephone Encounter (Signed)
Called to schedule consult w/Dr. Debbrah Alar to discuss treatment, no answer, left vm. AW

## 2022-05-19 ENCOUNTER — Inpatient Hospital Stay: Payer: Managed Care, Other (non HMO)

## 2022-05-19 VITALS — BP 154/98 | HR 99 | Temp 98.4°F | Resp 20

## 2022-05-19 DIAGNOSIS — Z5111 Encounter for antineoplastic chemotherapy: Secondary | ICD-10-CM | POA: Diagnosis not present

## 2022-05-19 DIAGNOSIS — C3491 Malignant neoplasm of unspecified part of right bronchus or lung: Secondary | ICD-10-CM

## 2022-05-19 MED ORDER — PEGFILGRASTIM INJECTION 6 MG/0.6ML ~~LOC~~
6.0000 mg | PREFILLED_SYRINGE | Freq: Once | SUBCUTANEOUS | Status: AC
  Administered 2022-05-19: 6 mg via SUBCUTANEOUS
  Filled 2022-05-19: qty 0.6

## 2022-05-19 NOTE — Patient Instructions (Signed)

## 2022-05-20 ENCOUNTER — Ambulatory Visit: Payer: Managed Care, Other (non HMO)

## 2022-05-25 ENCOUNTER — Other Ambulatory Visit: Payer: Self-pay | Admitting: Medical Oncology

## 2022-05-25 DIAGNOSIS — C3491 Malignant neoplasm of unspecified part of right bronchus or lung: Secondary | ICD-10-CM

## 2022-05-26 ENCOUNTER — Encounter: Payer: Self-pay | Admitting: Nurse Practitioner

## 2022-05-26 ENCOUNTER — Inpatient Hospital Stay: Payer: Managed Care, Other (non HMO)

## 2022-05-26 ENCOUNTER — Other Ambulatory Visit: Payer: Self-pay

## 2022-05-26 ENCOUNTER — Inpatient Hospital Stay (HOSPITAL_BASED_OUTPATIENT_CLINIC_OR_DEPARTMENT_OTHER): Payer: Managed Care, Other (non HMO) | Admitting: Nurse Practitioner

## 2022-05-26 ENCOUNTER — Inpatient Hospital Stay: Payer: Managed Care, Other (non HMO) | Admitting: Internal Medicine

## 2022-05-26 VITALS — BP 120/81 | HR 100 | Temp 97.8°F | Resp 16 | Wt 208.3 lb

## 2022-05-26 VITALS — BP 120/81 | HR 100 | Temp 97.8°F | Resp 16 | Wt 208.0 lb

## 2022-05-26 DIAGNOSIS — Z515 Encounter for palliative care: Secondary | ICD-10-CM

## 2022-05-26 DIAGNOSIS — C3491 Malignant neoplasm of unspecified part of right bronchus or lung: Secondary | ICD-10-CM | POA: Diagnosis not present

## 2022-05-26 DIAGNOSIS — K5900 Constipation, unspecified: Secondary | ICD-10-CM | POA: Diagnosis not present

## 2022-05-26 DIAGNOSIS — G893 Neoplasm related pain (acute) (chronic): Secondary | ICD-10-CM | POA: Diagnosis not present

## 2022-05-26 DIAGNOSIS — R630 Anorexia: Secondary | ICD-10-CM

## 2022-05-26 DIAGNOSIS — Z5111 Encounter for antineoplastic chemotherapy: Secondary | ICD-10-CM

## 2022-05-26 LAB — CBC WITH DIFFERENTIAL (CANCER CENTER ONLY)
Abs Immature Granulocytes: 0.24 10*3/uL — ABNORMAL HIGH (ref 0.00–0.07)
Basophils Absolute: 0.1 10*3/uL (ref 0.0–0.1)
Basophils Relative: 2 %
Eosinophils Absolute: 0.1 10*3/uL (ref 0.0–0.5)
Eosinophils Relative: 3 %
HCT: 42.9 % (ref 39.0–52.0)
Hemoglobin: 14.9 g/dL (ref 13.0–17.0)
Immature Granulocytes: 6 %
Lymphocytes Relative: 16 %
Lymphs Abs: 0.6 10*3/uL — ABNORMAL LOW (ref 0.7–4.0)
MCH: 29.9 pg (ref 26.0–34.0)
MCHC: 34.7 g/dL (ref 30.0–36.0)
MCV: 86 fL (ref 80.0–100.0)
Monocytes Absolute: 0.9 10*3/uL (ref 0.1–1.0)
Monocytes Relative: 22 %
Neutro Abs: 2.1 10*3/uL (ref 1.7–7.7)
Neutrophils Relative %: 51 %
Platelet Count: 151 10*3/uL (ref 150–400)
RBC: 4.99 MIL/uL (ref 4.22–5.81)
RDW: 12.4 % (ref 11.5–15.5)
Smear Review: NORMAL
WBC Count: 4.1 10*3/uL (ref 4.0–10.5)
nRBC: 0 % (ref 0.0–0.2)

## 2022-05-26 LAB — CMP (CANCER CENTER ONLY)
ALT: 27 U/L (ref 0–44)
AST: 17 U/L (ref 15–41)
Albumin: 2.8 g/dL — ABNORMAL LOW (ref 3.5–5.0)
Alkaline Phosphatase: 69 U/L (ref 38–126)
Anion gap: 5 (ref 5–15)
BUN: 15 mg/dL (ref 6–20)
CO2: 32 mmol/L (ref 22–32)
Calcium: 8.6 mg/dL — ABNORMAL LOW (ref 8.9–10.3)
Chloride: 94 mmol/L — ABNORMAL LOW (ref 98–111)
Creatinine: 0.75 mg/dL (ref 0.61–1.24)
GFR, Estimated: >60 mL/min (ref >60)
Glucose, Bld: 157 mg/dL — ABNORMAL HIGH (ref 70–99)
Potassium: 3.8 mmol/L (ref 3.5–5.1)
Sodium: 131 mmol/L — ABNORMAL LOW (ref 135–145)
Total Bilirubin: 0.4 mg/dL (ref 0.3–1.2)
Total Protein: 5.6 g/dL — ABNORMAL LOW (ref 6.5–8.1)

## 2022-05-26 MED ORDER — MORPHINE SULFATE 15 MG PO TABS: 15.0000 mg | ORAL_TABLET | Freq: Four times a day (QID) | ORAL | 0 refills | Status: DC | PRN

## 2022-05-26 MED ORDER — MORPHINE SULFATE ER 30 MG PO TBCR: 30.0000 mg | EXTENDED_RELEASE_TABLET | Freq: Three times a day (TID) | ORAL | 0 refills | Status: DC

## 2022-05-26 NOTE — Progress Notes (Signed)
[facility] [phone]   [phone]  OFFICE PROGRESS NOTE  [name], MD [address] [place] [address]  DIAGNOSIS:  1) recurrent non-small cell lung cancer, squamous cell carcinoma initially diagnosed as stage IIIb (T3, N2, M0) non-small cell lung cancer, squamous cell carcinoma presented with large right upper lobe perihilar mass with right hilar and subcarinal lymphadenopathy diagnosed in April 2022. 2) left lower extremity deep venous thrombosis diagnosed in October 2022.  Currently on treatment with Xarelto.  PRIOR THERAPY:  1) Concurrent chemoradiation with weekly carboplatin for AUC of 2 and paclitaxel 45 Mg/M2.  Status post 8 cycles.  Last dose was given November 10, 2020. 2) Consolidation treatment with immunotherapy with Imfinzi 1500 Mg IV every 4 weeks.  First dose December 14, 2020.  Status post 3 cycles.  This was discontinued secondary to intolerance with highly suspicious immunotherapy mediated pneumonitis.  CURRENT THERAPY: Systemic chemotherapy with carboplatin for AUC of 5 and paclitaxel 175 Mg/M2 with Neulasta support every 3 weeks.  Status post 1 cycle.  INTERVAL HISTORY: Paul Mclean 62 y.o. male returns to the clinic today for follow-up visit accompanied by his wife and daughter.  The patient is feeling much better today with less pain on the right side of the chest as well as improved shortness of breath after starting the first cycle of his systemic chemotherapy.  He has a good day after starting the treatment but few days of weakness and fatigue as well as aching pain after the Neulasta injection.  He is feeling much better today and recovering from the adverse effect of Neulasta.  He denied having any current chest pain or hemoptysis.  He has no nausea, vomiting, diarrhea or constipation.  He has no headache or visual changes.  He is here today for evaluation and repeat blood work.   MEDICAL HISTORY: Past Medical  History:  Diagnosis Date   Atrial flutter (HCC)    question of brief aflutter 02/28/22 in setting of acute pericarditis (not captured on ECG)   Cancer (HCC)    right lung cancer   DVT (deep venous thrombosis) (HCC) 02/27/2021   LLE partially occlusive DVT 02/25/21, started on Xarelto   Dyspnea    due to cancer per pt   History of radiation therapy 11/12/2020   right lung  09/30/2020-11/12/2020  Dr Antony Blackbird   Hypothyroidism    Thyroid disease     ALLERGIES:  has No Known Allergies.  MEDICATIONS:  Current Outpatient Medications  Medication Sig Dispense Refill   albuterol (VENTOLIN HFA) 108 (90 Base) MCG/ACT inhaler Inhale 2 puffs into the lungs every 6 (six) hours as needed for wheezing or shortness of breath. 8 g 2   Budeson-Glycopyrrol-Formoterol (BREZTRI AEROSPHERE) 160-9-4.8 MCG/ACT AERO Inhale 2 puffs into the lungs in the morning and at bedtime. 10.7 g 11   furosemide (LASIX) 20 MG tablet 1 tablet daily as needed for the swelling of the lower extremities 20 tablet 0   gabapentin (NEURONTIN) 300 MG capsule Take 1 capsule (300 mg total) by mouth 2 (two) times daily. 60 capsule 1   ibuprofen (ADVIL) 200 MG tablet Take 200 mg by mouth in the morning and at bedtime.     ipratropium-albuterol (DUONEB) 0.5-2.5 (3) MG/3ML SOLN Inhale 3 mLs into the lungs every 6 (six) hours as needed (wheezing/shortness of breath).     levothyroxine (SYNTHROID) 112 MCG tablet Take 112 mcg by mouth daily before breakfast.  morphine (MS CONTIN) 30 MG 12 hr tablet Take 1 tablet (30 mg total) by mouth every 8 (eight) hours. 60 tablet 0   morphine (MSIR) 15 MG tablet Take 1 tablet (15 mg total) by mouth every 6 (six) hours as needed (pain.). 40 tablet 0   ondansetron (ZOFRAN) 8 MG tablet Take 1 tablet (8 mg total) by mouth every 8 (eight) hours as needed for nausea or vomiting. May take starting on day 3 following chemotherapy 30 tablet 1   pantoprazole (PROTONIX) 40 MG tablet Take 1 tablet (40 mg total) by  mouth daily. 30 tablet 2   polyethylene glycol powder (MIRALAX) 17 GM/SCOOP powder Take 1 scoop (17GM) once daily. 255 g 0   predniSONE (DELTASONE) 10 MG tablet Take 5 mg by mouth in the morning.     XARELTO 20 MG TABS tablet TAKE 1 TABLET(20 MG) BY MOUTH DAILY WITH SUPPER (Patient taking differently: Take 20 mg by mouth in the morning.) 30 tablet 3   No current facility-administered medications for this visit.    SURGICAL HISTORY:  Past Surgical History:  Procedure Laterality Date   APPENDECTOMY     BRONCHIAL BIOPSY  09/01/2020   Procedure: BRONCHIAL BIOPSIES;  Surgeon: Josephine Igo, DO;  Location: [facility];  Service: Pulmonary;;   BRONCHIAL BIOPSY  04/05/2022   Procedure: BRONCHIAL BIOPSIES;  Surgeon: Josephine Igo, DO;  Location: [facility];  Service: Pulmonary;;   BRONCHIAL BRUSHINGS  09/01/2020   Procedure: BRONCHIAL BRUSHINGS;  Surgeon: Josephine Igo, DO;  Location: [facility];  Service: Pulmonary;;   BRONCHIAL NEEDLE ASPIRATION BIOPSY  09/01/2020   Procedure: BRONCHIAL NEEDLE ASPIRATION BIOPSIES;  Surgeon: Josephine Igo, DO;  Location: [facility];  Service: Pulmonary;;   BRONCHIAL WASHINGS  09/01/2020   Procedure: BRONCHIAL WASHINGS;  Surgeon: Josephine Igo, DO;  Location: [facility];  Service: Pulmonary;;   FINE NEEDLE ASPIRATION  04/05/2022   Procedure: FINE NEEDLE ASPIRATION (FNA) LINEAR;  Surgeon: Josephine Igo, DO;  Location: [facility];  Service: Pulmonary;;   HERNIA REPAIR     INCISION AND DRAINAGE ABSCESS N/A 08/25/2017   Procedure: INCISION AND DRAINAGE perineal  ABSCESS;  Surgeon: Jerilee Field, MD;  Location: [facility];  Service: Urology;  Laterality: N/A;   VIDEO BRONCHOSCOPY WITH ENDOBRONCHIAL ULTRASOUND N/A 09/01/2020   Procedure: VIDEO BRONCHOSCOPY WITH ENDOBRONCHIAL ULTRASOUND;  Surgeon: Josephine Igo, DO;  Location: [facility];  Service: Pulmonary;  Laterality: N/A;   VIDEO BRONCHOSCOPY WITH ENDOBRONCHIAL ULTRASOUND Right  04/05/2022   Procedure: VIDEO BRONCHOSCOPY WITH ENDOBRONCHIAL ULTRASOUND;  Surgeon: Josephine Igo, DO;  Location: [facility];  Service: Pulmonary;  Laterality: Right;    REVIEW OF SYSTEMS:  Constitutional: positive for fatigue Eyes: negative Ears, nose, mouth, throat, and face: negative Respiratory: positive for dyspnea on exertion Cardiovascular: negative Gastrointestinal: negative Genitourinary:negative Integument/breast: negative Hematologic/lymphatic: negative Musculoskeletal:negative Neurological: negative Behavioral/Psych: negative Endocrine: negative Allergic/Immunologic: negative   PHYSICAL EXAMINATION: General appearance: alert, cooperative, fatigued, and no distress Head: Normocephalic, without obvious abnormality, atraumatic Neck: no adenopathy, no JVD, supple, symmetrical, trachea midline, and thyroid not enlarged, symmetric, no tenderness/mass/nodules Lymph nodes: Cervical, supraclavicular, and axillary nodes normal. Resp: wheezes RUL Back: symmetric, no curvature. ROM normal. No CVA tenderness. Cardio: regular rate and rhythm, S1, S2 normal, no murmur, click, rub or gallop GI: soft, non-tender; bowel sounds normal; no masses,  no organomegaly Extremities: extremities normal, atraumatic, no cyanosis or edema Neurologic: Alert and oriented X 3, normal strength and tone. Normal symmetric reflexes. Normal coordination and  gait  ECOG PERFORMANCE STATUS: 1 - Symptomatic but completely ambulatory  Blood pressure 120/81, pulse 100, temperature 97.8 F (36.6 C), temperature source Oral, resp. rate 16, weight 208 lb 4.8 oz (94.5 kg), SpO2 98 %.  LABORATORY DATA: Lab Results  Component Value Date   WBC 4.1 05/26/2022   HGB 14.9 05/26/2022   HCT 42.9 05/26/2022   MCV 86.0 05/26/2022   PLT 151 05/26/2022      Chemistry      Component Value Date/Time   NA 131 (L) 05/26/2022 1022   K 3.8 05/26/2022 1022   CL 94 (L) 05/26/2022 1022   CO2 32 05/26/2022 1022   BUN  15 05/26/2022 1022   CREATININE 0.75 05/26/2022 1022      Component Value Date/Time   CALCIUM 8.6 (L) 05/26/2022 1022   ALKPHOS 69 05/26/2022 1022   AST 17 05/26/2022 1022   ALT 27 05/26/2022 1022   BILITOT 0.4 05/26/2022 1022       RADIOGRAPHIC STUDIES: No results found.   ASSESSMENT AND PLAN: This is a very pleasant 62 years old white male recently diagnosed with stage IIIb (T3, N2, M0) non-small cell lung cancer, squamous cell carcinoma presented with large right upper lobe perihilar mass with right hilar and subcarinal lymphadenopathy diagnosed in April 2022. The patient underwent a course of concurrent chemoradiation with weekly carboplatin and paclitaxel status post 8 cycles.  The patient tolerated his treatment well except for fatigue. His scan showed partial response with improvement in the right hilar mass as well as resolution of the right upper lobe lung nodule. The patient started on treatment with consolidation immunotherapy with Imfinzi 1500 Mg IV every 4 weeks status post 3 cycles.  He has been tolerating the treatment well except for the worsening cough and shortness of breath. 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  He has repeat bronchoscopy by Dr. Tonia Brooms that showed evidence for residual/recurrent squamous cell carcinoma of the lung in the right upper lobe.  The patient also had a PET scan and MRI performed recently.  I personally and independently reviewed the images and discussed the result and showed the the images to the patient and his wife. His scan showed worsening and progressive hypermetabolic activity in the right upper lobe/hilar mass concerning for progressive disease locally. He was  seen by Dr. Roselind Messier for consideration of palliative radiotherapy to the recurrent disease in the right lung but Dr. Roselind Messier did not feel that he can offer the patient any additional radiation because of the high-dose he received in the past. The patient started systemic chemotherapy with carboplatin for AUC of 5, paclitaxel 175 Mg/M2 and Keytruda 200 Mg IV every 3 weeks with Neulasta support last week.  Status post 1 cycle.  He tolerated the first cycle of this treatment fairly well with no concerning adverse effect except for expected fatigue and aching pain after the Neulasta injection. I recommended for the patient to continue his treatment as planned and he is expected to start cycle #2 in 2 weeks. For the pain management, he will continue on MS Contin as well as MS IR.  He is also seen by the palliative care team for management of his pain issues. For the swelling of the  lower extremities is significantly improved with the Lasix treatment. For the history of pulmonary embolism, he will continue his current treatment with Xarelto. The patient was advised to call immediately if he has any other concerning symptoms in the interval. All questions were answered. The patient knows to call the clinic with any problems, questions or concerns. We can certainly see the patient much sooner if necessary.  The total time spent in the appointment was 30 minutes.  Disclaimer: This note was dictated with voice recognition software. Similar sounding words can inadvertently be transcribed and may not be corrected upon review.

## 2022-05-26 NOTE — Progress Notes (Signed)
Palliative Medicine [facility]  Telephone:([phone] Fax:(336) [phone]   Name: Paul Mclean Date: 05/26/2022 MRN: [id number]  DOB: 06-09-1960  Patient Care Team: [name], MD as PCP - General (Internal Medicine) Pickenpack-Cousar, Arty Baumgartner, NP as Nurse Practitioner (Nurse Practitioner)    INTERVAL HISTORY: Paul Mclean is a 62 y.o. 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  Palliative ask to see for pain management and goals of care.  SOCIAL HISTORY:     reports that he quit smoking about 16 years ago. His smoking use included cigarettes. He has a 60.00 pack-year smoking history. He has never used smokeless tobacco. He reports current alcohol use of about 1.0 - 2.0 standard drink of alcohol per week. He reports that he does not use drugs.  ADVANCE DIRECTIVES:  Advanced directives on file  CODE STATUS: Full code  PAST MEDICAL HISTORY: Past Medical History:  Diagnosis Date   Atrial flutter (HCC)    question of brief aflutter 02/28/22 in setting of acute pericarditis (not captured on ECG)   Cancer (HCC)    right lung cancer   DVT (deep venous thrombosis) (HCC) 02/27/2021   LLE partially occlusive DVT 02/25/21, started on Xarelto   Dyspnea    due to cancer per pt   History of radiation therapy 11/12/2020   right lung  09/30/2020-11/12/2020  Dr Antony Blackbird   Hypothyroidism    Thyroid disease     ALLERGIES:  has No Known Allergies.  MEDICATIONS:  Current Outpatient Medications  Medication Sig Dispense Refill   albuterol (VENTOLIN HFA) 108 (90 Base) MCG/ACT inhaler Inhale 2 puffs into the lungs every 6 (six) hours as needed for wheezing or shortness of breath. 8 g 2   Budeson-Glycopyrrol-Formoterol (BREZTRI AEROSPHERE) 160-9-4.8 MCG/ACT AERO Inhale 2 puffs into the lungs in the morning and at bedtime. 10.7 g 11   furosemide (LASIX) 20 MG  tablet 1 tablet daily as needed for the swelling of the lower extremities 20 tablet 0   gabapentin (NEURONTIN) 300 MG capsule Take 1 capsule (300 mg total) by mouth 2 (two) times daily. 60 capsule 1   ibuprofen (ADVIL) 200 MG tablet Take 200 mg by mouth in the morning and at bedtime.     ipratropium-albuterol (DUONEB) 0.5-2.5 (3) MG/3ML SOLN Inhale 3 mLs into the lungs every 6 (six) hours as needed (wheezing/shortness of breath).     levothyroxine (SYNTHROID) 112 MCG tablet Take 112 mcg by mouth daily before breakfast.     morphine (MS CONTIN) 30 MG 12 hr tablet Take 1 tablet (30 mg total) by mouth every 8 (eight) hours. 60 tablet 0   morphine (MSIR) 15 MG tablet Take 1 tablet (15 mg total) by mouth every 6 (six) hours as needed (pain.). 40 tablet 0   ondansetron (ZOFRAN) 8 MG tablet Take 1 tablet (8 mg total) by mouth every 8 (eight) hours as needed for nausea or vomiting. May take starting on day 3 following chemotherapy 30 tablet 1   pantoprazole (PROTONIX) 40 MG tablet Take 1 tablet (40 mg total) by mouth daily. 30 tablet 2   polyethylene glycol powder (MIRALAX) 17 GM/SCOOP powder Take 1 scoop (17GM) once daily. 255 g 0   predniSONE (DELTASONE) 10 MG tablet Take 5 mg by mouth in the morning.     XARELTO 20 MG TABS tablet TAKE 1 TABLET(20 MG) BY  MOUTH DAILY WITH SUPPER (Patient taking differently: Take 20 mg by mouth in the morning.) 30 tablet 3   No current facility-administered medications for this visit.    VITAL SIGNS: There were no vitals taken for this visit. There were no vitals filed for this visit.  Estimated body mass index is 30.75 kg/m as calculated from the following:   Height as of 04/27/22: 5\' 9"  (1.753 m).   Weight as of 05/18/22: 208 lb 3.2 oz (94.4 kg).   PERFORMANCE STATUS (ECOG) : 1 - Symptomatic but completely ambulatory   Physical Exam General: NAD Cardiovascular: regular rate and rhythm Pulmonary: normal breathing pattern  Abdomen: soft, nontender, + bowel  sounds Extremities: no edema, no joint deformities Skin: no rashes Neurological: AAO x3   IMPRESSION:  Paul Mclean presents to clinic today for symptom management follow-up. His wife and daughter is also present. No acute distress noted. Patient is remaining as active as possible.    Neoplasm related pain Paul Mclean reports his pain is well controlled on current regimen. He is much appreciative of this improvement compared to previous weeks. His previously expressed pain goal was 5 out of 10. He is happy to report that with current regimen he has obtained this goal and would like to remain the course. Quality of life has improved.    We discussed at length his current regimen: MS Contin 30 mg three times daily and MS IR 15 mg every 4-6 hours as needed for breakthrough pain.    We will continue to closely monitor and support .   Decreased appetite/weight loss Appetite fluctuates. Some days better than others. His weight is remaining stable at 208lbs.   I discussed the importance of continued conversation with family and their medical providers regarding overall plan of care and treatment options, ensuring decisions are within the context of the patients values and GOCs.  PLAN:  MS Contin 30 mg three times daily MS IR 15mg  every 4-6 hours as needed for breakthrough pain Gabapentin 300mg  at bedtime Protonix 40mg  daily Miralax daily for bowel regimen We will continue to closely monitor and assist with symptom management.  I will plan to see patient back in 2-3 weeks in collaboration to other oncology appointments. Patient and family knows to contact office if needed sooner.    Patient expressed understanding and was in agreement with this plan. He also understands that He can call the clinic at any time with any questions, concerns, or complaints.   Any controlled substances utilized were prescribed in the context of palliative care. PDMP has been reviewed.   Time Total: 45 min.    Visit consisted of counseling and education dealing with the complex and emotionally intense issues of symptom management and palliative care in the setting of serious and potentially life-threatening illness.Greater than 50%  of this time was spent counseling and coordinating care related to the above assessment and plan.  11-16-2003, AGPCNP-BC  Palliative Medicine Team/[facility]

## 2022-05-29 ENCOUNTER — Other Ambulatory Visit: Payer: Self-pay | Admitting: Internal Medicine

## 2022-06-02 ENCOUNTER — Encounter: Payer: Self-pay | Admitting: Medical Oncology

## 2022-06-02 ENCOUNTER — Other Ambulatory Visit: Payer: Self-pay

## 2022-06-02 ENCOUNTER — Other Ambulatory Visit: Payer: Self-pay | Admitting: Internal Medicine

## 2022-06-02 DIAGNOSIS — G893 Neoplasm related pain (acute) (chronic): Secondary | ICD-10-CM

## 2022-06-02 DIAGNOSIS — C3491 Malignant neoplasm of unspecified part of right bronchus or lung: Secondary | ICD-10-CM

## 2022-06-02 DIAGNOSIS — K5903 Drug induced constipation: Secondary | ICD-10-CM

## 2022-06-02 DIAGNOSIS — Z515 Encounter for palliative care: Secondary | ICD-10-CM

## 2022-06-02 DIAGNOSIS — M792 Neuralgia and neuritis, unspecified: Secondary | ICD-10-CM

## 2022-06-02 MED ORDER — MORPHINE SULFATE ER 30 MG PO TBCR: 30.0000 mg | EXTENDED_RELEASE_TABLET | Freq: Three times a day (TID) | ORAL | 0 refills | Status: DC

## 2022-06-02 MED ORDER — MORPHINE SULFATE 15 MG PO TABS: 15.0000 mg | ORAL_TABLET | ORAL | 0 refills | Status: DC | PRN

## 2022-06-02 MED ORDER — FUROSEMIDE 20 MG PO TABS: ORAL_TABLET | ORAL | 0 refills | Status: DC

## 2022-06-02 MED ORDER — POLYETHYLENE GLYCOL 3350 17 GM/SCOOP PO POWD: ORAL | 0 refills | Status: DC

## 2022-06-02 NOTE — Telephone Encounter (Signed)
From: [name] To: Jobe Gibbon, NP Sent: 06/02/2022 9:22 AM EST Subject: Medication Renewal Request  Refills have been requested for the following medications:   polyethylene glycol powder (MIRALAX) 17 GM/SCOOP powder [Mychal Decarlo Pickenpack-Cousar]   morphine (MS CONTIN) 30 MG 12 hr tablet [Sharilynn Cassity Pickenpack-Cousar]   morphine (MSIR) 15 MG tablet Chesley Noon Pickenpack-Cousar]  Preferred pharmacy: [facility] [phone] - [place], [address] AT [place] Delivery method: Pickup   Medication renewals requested in this message routed separately:   furosemide (LASIX) 20 MG tablet [Mohamed K Mohamed]

## 2022-06-07 ENCOUNTER — Telehealth (HOSPITAL_COMMUNITY): Payer: Self-pay

## 2022-06-07 MED FILL — Dexamethasone Sodium Phosphate Inj 100 MG/10ML: INTRAMUSCULAR | Qty: 1 | Status: AC

## 2022-06-07 MED FILL — Fosaprepitant Dimeglumine For IV Infusion 150 MG (Base Eq): INTRAVENOUS | Qty: 5 | Status: AC

## 2022-06-07 NOTE — Telephone Encounter (Signed)
Called to schedule consult, no answer, left vm. AB

## 2022-06-08 ENCOUNTER — Inpatient Hospital Stay: Payer: Managed Care, Other (non HMO)

## 2022-06-08 ENCOUNTER — Other Ambulatory Visit: Payer: Self-pay

## 2022-06-08 ENCOUNTER — Inpatient Hospital Stay: Payer: Managed Care, Other (non HMO) | Admitting: Internal Medicine

## 2022-06-08 ENCOUNTER — Encounter: Payer: Self-pay | Admitting: Internal Medicine

## 2022-06-08 DIAGNOSIS — Z5111 Encounter for antineoplastic chemotherapy: Secondary | ICD-10-CM | POA: Diagnosis not present

## 2022-06-08 DIAGNOSIS — C3491 Malignant neoplasm of unspecified part of right bronchus or lung: Secondary | ICD-10-CM

## 2022-06-08 LAB — CBC WITH DIFFERENTIAL (CANCER CENTER ONLY)
Abs Immature Granulocytes: 0.07 10*3/uL (ref 0.00–0.07)
Basophils Absolute: 0.1 10*3/uL (ref 0.0–0.1)
Basophils Relative: 1 %
Eosinophils Absolute: 0 10*3/uL (ref 0.0–0.5)
Eosinophils Relative: 0 %
HCT: 38 % — ABNORMAL LOW (ref 39.0–52.0)
Hemoglobin: 13.1 g/dL (ref 13.0–17.0)
Immature Granulocytes: 1 %
Lymphocytes Relative: 15 %
Lymphs Abs: 1.2 10*3/uL (ref 0.7–4.0)
MCH: 29.8 pg (ref 26.0–34.0)
MCHC: 34.5 g/dL (ref 30.0–36.0)
MCV: 86.6 fL (ref 80.0–100.0)
Monocytes Absolute: 1 10*3/uL (ref 0.1–1.0)
Monocytes Relative: 13 %
Neutro Abs: 5.8 10*3/uL (ref 1.7–7.7)
Neutrophils Relative %: 70 %
Platelet Count: 299 10*3/uL (ref 150–400)
RBC: 4.39 MIL/uL (ref 4.22–5.81)
RDW: 13.2 % (ref 11.5–15.5)
WBC Count: 8.1 10*3/uL (ref 4.0–10.5)
nRBC: 0 % (ref 0.0–0.2)

## 2022-06-08 LAB — CMP (CANCER CENTER ONLY)
ALT: 36 U/L (ref 0–44)
AST: 24 U/L (ref 15–41)
Albumin: 3.1 g/dL — ABNORMAL LOW (ref 3.5–5.0)
Alkaline Phosphatase: 69 U/L (ref 38–126)
Anion gap: 5 (ref 5–15)
BUN: 14 mg/dL (ref 6–20)
CO2: 31 mmol/L (ref 22–32)
Calcium: 9 mg/dL (ref 8.9–10.3)
Chloride: 101 mmol/L (ref 98–111)
Creatinine: 0.81 mg/dL (ref 0.61–1.24)
GFR, Estimated: >60 mL/min (ref >60)
Glucose, Bld: 114 mg/dL — ABNORMAL HIGH (ref 70–99)
Potassium: 3.9 mmol/L (ref 3.5–5.1)
Sodium: 137 mmol/L (ref 135–145)
Total Bilirubin: 0.3 mg/dL (ref 0.3–1.2)
Total Protein: 6.8 g/dL (ref 6.5–8.1)

## 2022-06-08 MED ORDER — SODIUM CHLORIDE 0.9 % IV SOLN
10.0000 mg | Freq: Once | INTRAVENOUS | Status: AC
  Administered 2022-06-08: 10 mg via INTRAVENOUS
  Filled 2022-06-08: qty 10

## 2022-06-08 MED ORDER — DIPHENHYDRAMINE HCL 50 MG/ML IJ SOLN
50.0000 mg | Freq: Once | INTRAMUSCULAR | Status: AC
  Administered 2022-06-08: 50 mg via INTRAVENOUS
  Filled 2022-06-08: qty 1

## 2022-06-08 MED ORDER — SODIUM CHLORIDE 0.9 % IV SOLN
200.0000 mg/m2 | Freq: Once | INTRAVENOUS | Status: AC
  Administered 2022-06-08: 432 mg via INTRAVENOUS
  Filled 2022-06-08: qty 72

## 2022-06-08 MED ORDER — SODIUM CHLORIDE 0.9 % IV SOLN
750.0000 mg | Freq: Once | INTRAVENOUS | Status: AC
  Administered 2022-06-08: 750 mg via INTRAVENOUS
  Filled 2022-06-08: qty 75

## 2022-06-08 MED ORDER — PALONOSETRON HCL INJECTION 0.25 MG/5ML
0.2500 mg | Freq: Once | INTRAVENOUS | Status: AC
  Administered 2022-06-08: 0.25 mg via INTRAVENOUS
  Filled 2022-06-08: qty 5

## 2022-06-08 MED ORDER — FAMOTIDINE IN NACL 20-0.9 MG/50ML-% IV SOLN
20.0000 mg | Freq: Once | INTRAVENOUS | Status: AC
  Administered 2022-06-08: 20 mg via INTRAVENOUS
  Filled 2022-06-08: qty 50

## 2022-06-08 MED ORDER — SODIUM CHLORIDE 0.9 % IV SOLN
150.0000 mg | Freq: Once | INTRAVENOUS | Status: AC
  Administered 2022-06-08: 150 mg via INTRAVENOUS
  Filled 2022-06-08: qty 150

## 2022-06-08 MED ORDER — SODIUM CHLORIDE 0.9 % IV SOLN: Freq: Once | INTRAVENOUS | Status: AC

## 2022-06-08 NOTE — Progress Notes (Signed)
[facility] Telephone:([phone]   Fax:(336) [phone]  OFFICE PROGRESS NOTE  Paul Noble, MD [address] [place] [address]  DIAGNOSIS:  1) recurrent non-small cell lung cancer, squamous cell carcinoma initially diagnosed as stage IIIb (T3, N2, M0) non-small cell lung cancer, squamous cell carcinoma presented with large right upper lobe perihilar mass with right hilar and subcarinal lymphadenopathy diagnosed in April 2022. 2) left lower extremity deep venous thrombosis diagnosed in October 2022.  Currently on treatment with Xarelto.  PRIOR THERAPY:  1) Concurrent chemoradiation with weekly carboplatin for AUC of 2 and paclitaxel 45 Mg/M2.  Status post 8 cycles.  Last dose was given November 10, 2020. 2) Consolidation treatment with immunotherapy with Imfinzi 1500 Mg IV every 4 weeks.  First dose December 14, 2020.  Status post 3 cycles.  This was discontinued secondary to intolerance with highly suspicious immunotherapy mediated pneumonitis.  CURRENT THERAPY: Systemic chemotherapy with carboplatin for AUC of 5 and paclitaxel 175 Mg/M2 with Neulasta support every 3 weeks.  Status post 1 cycle.  INTERVAL HISTORY: Paul Mclean 62 y.o. male returns to the clinic today for follow-up visit accompanied by his wife and daughter.  The patient is feeling fine today with no concerning complaints except for mild fatigue.  He denied having any current chest pain, shortness of breath, cough or hemoptysis.  He has no nausea, vomiting, diarrhea or constipation.  He has no headache or visual changes.  He continues to have mild peripheral neuropathy.  He is here today for evaluation before starting cycle #2 of his treatment.   MEDICAL HISTORY: Past Medical History:  Diagnosis Date   Atrial flutter ([place])    question of brief aflutter 02/28/22 in setting of acute pericarditis (not captured on ECG)   Cancer ([facility])    right lung cancer   DVT (deep venous thrombosis) ([place])  02/27/2021   LLE partially occlusive DVT 02/25/21, started on Xarelto   Dyspnea    due to cancer per pt   History of radiation therapy 11/12/2020   right lung  09/30/2020-11/12/2020  Dr Gery Pray   Hypothyroidism    Thyroid disease     ALLERGIES:  has No Known Allergies.  MEDICATIONS:  Current Outpatient Medications  Medication Sig Dispense Refill   albuterol (VENTOLIN HFA) 108 (90 Base) MCG/ACT inhaler Inhale 2 puffs into the lungs every 6 (six) hours as needed for wheezing or shortness of breath. 8 g 2   Budeson-Glycopyrrol-Formoterol (BREZTRI AEROSPHERE) 160-9-4.8 MCG/ACT AERO Inhale 2 puffs into the lungs in the morning and at bedtime. 10.7 g 11   furosemide (LASIX) 20 MG tablet TAKE 1 TABLET BY MOUTH DAILY AS NEEDED FOR SWELLING 20 tablet 0   gabapentin (NEURONTIN) 300 MG capsule Take 1 capsule (300 mg total) by mouth 2 (two) times daily. 60 capsule 1   ibuprofen (ADVIL) 200 MG tablet Take 200 mg by mouth in the morning and at bedtime.     ipratropium-albuterol (DUONEB) 0.5-2.5 (3) MG/3ML SOLN Inhale 3 mLs into the lungs every 6 (six) hours as needed (wheezing/shortness of breath).     levothyroxine (SYNTHROID) 112 MCG tablet Take 112 mcg by mouth daily before breakfast.     morphine (MS CONTIN) 30 MG 12 hr tablet Take 1 tablet (30 mg total) by mouth every 8 (eight) hours. 90 tablet 0   morphine (MSIR) 15 MG tablet Take 1 tablet (15 mg total) by mouth every 4 (four) hours as needed for moderate  pain or severe pain (pain.). 90 tablet 0   ondansetron (ZOFRAN) 8 MG tablet Take 1 tablet (8 mg total) by mouth every 8 (eight) hours as needed for nausea or vomiting. May take starting on day 3 following chemotherapy 30 tablet 1   pantoprazole (PROTONIX) 40 MG tablet Take 1 tablet (40 mg total) by mouth daily. 30 tablet 2   polyethylene glycol powder (MIRALAX) 17 GM/SCOOP powder Take 1 scoop (17GM) once daily. 255 g 0   predniSONE (DELTASONE) 10 MG tablet Take 5 mg by mouth in the morning.      XARELTO 20 MG TABS tablet TAKE 1 TABLET(20 MG) BY MOUTH DAILY WITH SUPPER (Patient taking differently: Take 20 mg by mouth in the morning.) 30 tablet 3   No current facility-administered medications for this visit.    SURGICAL HISTORY:  Past Surgical History:  Procedure Laterality Date   APPENDECTOMY     BRONCHIAL BIOPSY  09/01/2020   Procedure: BRONCHIAL BIOPSIES;  Surgeon: Garner Nash, DO;  Location: [facility];  Service: Pulmonary;;   BRONCHIAL BIOPSY  04/05/2022   Procedure: BRONCHIAL BIOPSIES;  Surgeon: Garner Nash, DO;  Location: [facility];  Service: Pulmonary;;   BRONCHIAL BRUSHINGS  09/01/2020   Procedure: BRONCHIAL BRUSHINGS;  Surgeon: Garner Nash, DO;  Location: [facility];  Service: Pulmonary;;   BRONCHIAL NEEDLE ASPIRATION BIOPSY  09/01/2020   Procedure: BRONCHIAL NEEDLE ASPIRATION BIOPSIES;  Surgeon: Garner Nash, DO;  Location: [facility];  Service: Pulmonary;;   BRONCHIAL WASHINGS  09/01/2020   Procedure: BRONCHIAL WASHINGS;  Surgeon: Garner Nash, DO;  Location: [facility];  Service: Pulmonary;;   FINE NEEDLE ASPIRATION  04/05/2022   Procedure: FINE NEEDLE ASPIRATION (FNA) LINEAR;  Surgeon: Garner Nash, DO;  Location: [facility];  Service: Pulmonary;;   HERNIA REPAIR     INCISION AND DRAINAGE ABSCESS N/A 08/25/2017   Procedure: INCISION AND DRAINAGE perineal  ABSCESS;  Surgeon: Festus Aloe, MD;  Location: [facility];  Service: Urology;  Laterality: N/A;   VIDEO BRONCHOSCOPY WITH ENDOBRONCHIAL ULTRASOUND N/A 09/01/2020   Procedure: VIDEO BRONCHOSCOPY WITH ENDOBRONCHIAL ULTRASOUND;  Surgeon: Garner Nash, DO;  Location: [facility];  Service: Pulmonary;  Laterality: N/A;   VIDEO BRONCHOSCOPY WITH ENDOBRONCHIAL ULTRASOUND Right 04/05/2022   Procedure: VIDEO BRONCHOSCOPY WITH ENDOBRONCHIAL ULTRASOUND;  Surgeon: Garner Nash, DO;  Location: [facility];  Service: Pulmonary;  Laterality: Right;    REVIEW OF SYSTEMS:  A  comprehensive review of systems was negative except for: Constitutional: positive for fatigue Neurological: positive for paresthesia   PHYSICAL EXAMINATION: General appearance: alert, cooperative, fatigued, and no distress Head: Normocephalic, without obvious abnormality, atraumatic Neck: no adenopathy, no JVD, supple, symmetrical, trachea midline, and thyroid not enlarged, symmetric, no tenderness/mass/nodules Lymph nodes: Cervical, supraclavicular, and axillary nodes normal. Resp: clear to auscultation bilaterally Back: symmetric, no curvature. ROM normal. No CVA tenderness. Cardio: regular rate and rhythm, S1, S2 normal, no murmur, click, rub or gallop GI: soft, non-tender; bowel sounds normal; no masses,  no organomegaly Extremities: extremities normal, atraumatic, no cyanosis or edema  ECOG PERFORMANCE STATUS: 1 - Symptomatic but completely ambulatory  Blood pressure (!) 129/94, pulse (!) 109, temperature 97.8 F (36.6 C), temperature source Temporal, resp. rate 20, height 5\' 9"  (1.753 m), weight 205 lb 14.4 oz (93.4 kg), SpO2 91 %.  LABORATORY DATA: Lab Results  Component Value Date   WBC 8.1 06/08/2022   HGB 13.1 06/08/2022   HCT 38.0 (L) 06/08/2022   MCV 86.6 06/08/2022  PLT 299 06/08/2022      Chemistry      Component Value Date/Time   NA 137 06/08/2022 0846   K 3.9 06/08/2022 0846   CL 101 06/08/2022 0846   CO2 31 06/08/2022 0846   BUN 14 06/08/2022 0846   CREATININE 0.81 06/08/2022 0846      Component Value Date/Time   CALCIUM 9.0 06/08/2022 0846   ALKPHOS 69 06/08/2022 0846   AST 24 06/08/2022 0846   ALT 36 06/08/2022 0846   BILITOT 0.3 06/08/2022 0846       RADIOGRAPHIC STUDIES: No results found.   ASSESSMENT AND PLAN: This is a very pleasant 62 years old white male recently diagnosed with stage IIIb (T3, N2, M0) non-small cell lung cancer, squamous cell carcinoma presented with large right upper lobe perihilar mass with right hilar and subcarinal  lymphadenopathy diagnosed in April 2022. The patient underwent a course of concurrent chemoradiation with weekly carboplatin and paclitaxel status post 8 cycles.  The patient tolerated his treatment well except for fatigue. His scan showed partial response with improvement in the right hilar mass as well as resolution of the right upper lobe lung nodule. The patient started on treatment with consolidation immunotherapy with Imfinzi 1500 Mg IV every 4 weeks status post 3 cycles.  He has been tolerating the treatment well except for the worsening cough and shortness of breath. 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  He has repeat bronchoscopy by Dr. Valeta Harms that showed evidence for residual/recurrent squamous cell carcinoma of the lung in the right upper lobe.  The patient also had a PET scan and MRI performed recently.  I personally and independently reviewed the images and discussed the result and showed the the images to the patient and his wife. His scan showed worsening and progressive hypermetabolic activity in the right upper lobe/hilar mass concerning for progressive disease locally. He was seen by Dr. Sondra Come for consideration of palliative radiotherapy to the recurrent disease in the right lung but Dr. Sondra Come did not feel that he can offer the patient any additional radiation because of the high-dose he received in the past. The patient started systemic chemotherapy with carboplatin for AUC of 5, paclitaxel 175 Mg/M2 and Keytruda 200 Mg IV every 3 weeks with Neulasta support last week.  Status post 1 cycle.  The patient tolerated the first cycle of his treatment fairly well with no concerning adverse effect except for mild  fatigue and mild neuropathy. I recommended for him to proceed with cycle #2 today as planned For the pain management, he will continue on MS Contin as well as MS IR.  He is also seen by the palliative care team for management of his pain issues. For the swelling of the lower extremities is significantly improved with the Lasix treatment. For the history of pulmonary embolism, he will continue his current treatment with Xarelto. He will come back for follow-up visit in 3 weeks for evaluation before starting cycle #3. He was advised to call immediately if he has any concerning symptoms in the interval. All questions were answered. The patient knows to call the clinic with any problems, questions or concerns. We can certainly see the patient much sooner if necessary.  The total time  spent in the appointment was 20 minutes.  Disclaimer: This note was dictated with voice recognition software. Similar sounding words can inadvertently be transcribed and may not be corrected upon review.

## 2022-06-08 NOTE — Progress Notes (Signed)
Per Dr. Julien Nordmann ,it is ok to treat pt today with Carboplatin and Taxol and heart rate of 109.

## 2022-06-08 NOTE — Patient Instructions (Signed)
[facility]  Discharge Instructions: Thank you for choosing [facility] to provide your oncology and hematology care.   If you have a lab appointment with the [facility], please go directly to the [facility] and check in at the registration area.   Wear comfortable clothing and clothing appropriate for easy access to any Portacath or PICC line.   We strive to give you quality time with your provider. You may need to reschedule your appointment if you arrive late (15 or more minutes).  Arriving late affects you and other patients whose appointments are after yours.  Also, if you miss three or more appointments without notifying the office, you may be dismissed from the clinic at the provider's discretion.      For prescription refill requests, have your pharmacy contact our office and allow 72 hours for refills to be completed.    Today you received the following chemotherapy and/or immunotherapy agents Taxol and Carboplatin.      To help prevent nausea and vomiting after your treatment, we encourage you to take your nausea medication as directed.  BELOW ARE SYMPTOMS THAT SHOULD BE REPORTED IMMEDIATELY: *FEVER GREATER THAN 100.4 F (38 C) OR HIGHER *CHILLS OR SWEATING *NAUSEA AND VOMITING THAT IS NOT CONTROLLED WITH YOUR NAUSEA MEDICATION *UNUSUAL SHORTNESS OF BREATH *UNUSUAL BRUISING OR BLEEDING *URINARY PROBLEMS (pain or burning when urinating, or frequent urination) *BOWEL PROBLEMS (unusual diarrhea, constipation, pain near the anus) TENDERNESS IN MOUTH AND THROAT WITH OR WITHOUT PRESENCE OF ULCERS (sore throat, sores in mouth, or a toothache) UNUSUAL RASH, SWELLING OR PAIN  UNUSUAL VAGINAL DISCHARGE OR ITCHING   Items with * indicate a potential emergency and should be followed up as soon as possible or go to the Emergency Department if any problems should occur.  Please show the CHEMOTHERAPY ALERT CARD or IMMUNOTHERAPY ALERT  CARD at check-in to the Emergency Department and triage nurse.  Should you have questions after your visit or need to cancel or reschedule your appointment, please contact [facility]  Dept: [phone]  and follow the prompts.  Office hours are 8:00 a.m. to 4:30 p.m. Monday - Friday. Please note that voicemails left after 4:00 p.m. may not be returned until the following business day.  We are closed weekends and major holidays. You have access to a nurse at all times for urgent questions. Please call the main number to the clinic Dept: [phone] and follow the prompts.   For any non-urgent questions, you may also contact your provider using MyChart. We now offer e-Visits for anyone 39 and older to request care online for non-urgent symptoms. For details visit [email or web address].   Also download the MyChart app! Go to the app store, search "MyChart", open the app, select [facility], and log in with your MyChart username and password.

## 2022-06-08 NOTE — Progress Notes (Signed)
Taxol diluted in NS 580mL Excel bag. AJL:[id number], Exp:09/05/2024  Acquanetta Belling, RPH, BCPS, BCOP 06/08/2022 11:16 AM

## 2022-06-09 ENCOUNTER — Encounter: Payer: Self-pay | Admitting: Internal Medicine

## 2022-06-10 ENCOUNTER — Other Ambulatory Visit: Payer: Self-pay

## 2022-06-10 ENCOUNTER — Inpatient Hospital Stay: Payer: Managed Care, Other (non HMO) | Attending: Physician Assistant

## 2022-06-10 VITALS — BP 132/86 | HR 100 | Temp 98.2°F | Resp 18

## 2022-06-10 DIAGNOSIS — C3431 Malignant neoplasm of lower lobe, right bronchus or lung: Secondary | ICD-10-CM | POA: Insufficient documentation

## 2022-06-10 DIAGNOSIS — Z86711 Personal history of pulmonary embolism: Secondary | ICD-10-CM | POA: Diagnosis not present

## 2022-06-10 DIAGNOSIS — Z87891 Personal history of nicotine dependence: Secondary | ICD-10-CM | POA: Insufficient documentation

## 2022-06-10 DIAGNOSIS — Z515 Encounter for palliative care: Secondary | ICD-10-CM | POA: Diagnosis not present

## 2022-06-10 DIAGNOSIS — J432 Centrilobular emphysema: Secondary | ICD-10-CM | POA: Diagnosis not present

## 2022-06-10 DIAGNOSIS — Z79891 Long term (current) use of opiate analgesic: Secondary | ICD-10-CM | POA: Diagnosis not present

## 2022-06-10 DIAGNOSIS — Z5111 Encounter for antineoplastic chemotherapy: Secondary | ICD-10-CM | POA: Diagnosis present

## 2022-06-10 DIAGNOSIS — Z5189 Encounter for other specified aftercare: Secondary | ICD-10-CM | POA: Insufficient documentation

## 2022-06-10 DIAGNOSIS — J90 Pleural effusion, not elsewhere classified: Secondary | ICD-10-CM | POA: Insufficient documentation

## 2022-06-10 DIAGNOSIS — Z86718 Personal history of other venous thrombosis and embolism: Secondary | ICD-10-CM | POA: Insufficient documentation

## 2022-06-10 DIAGNOSIS — Z79899 Other long term (current) drug therapy: Secondary | ICD-10-CM | POA: Diagnosis not present

## 2022-06-10 DIAGNOSIS — E039 Hypothyroidism, unspecified: Secondary | ICD-10-CM | POA: Diagnosis not present

## 2022-06-10 DIAGNOSIS — M7989 Other specified soft tissue disorders: Secondary | ICD-10-CM | POA: Insufficient documentation

## 2022-06-10 DIAGNOSIS — Z9221 Personal history of antineoplastic chemotherapy: Secondary | ICD-10-CM | POA: Insufficient documentation

## 2022-06-10 DIAGNOSIS — Z7901 Long term (current) use of anticoagulants: Secondary | ICD-10-CM | POA: Insufficient documentation

## 2022-06-10 DIAGNOSIS — C3491 Malignant neoplasm of unspecified part of right bronchus or lung: Secondary | ICD-10-CM

## 2022-06-10 DIAGNOSIS — Z923 Personal history of irradiation: Secondary | ICD-10-CM | POA: Diagnosis not present

## 2022-06-10 DIAGNOSIS — G893 Neoplasm related pain (acute) (chronic): Secondary | ICD-10-CM | POA: Insufficient documentation

## 2022-06-10 MED ORDER — PEGFILGRASTIM INJECTION 6 MG/0.6ML ~~LOC~~
6.0000 mg | PREFILLED_SYRINGE | Freq: Once | SUBCUTANEOUS | Status: AC
  Administered 2022-06-10: 6 mg via SUBCUTANEOUS
  Filled 2022-06-10: qty 0.6

## 2022-06-10 NOTE — Patient Instructions (Signed)

## 2022-06-15 ENCOUNTER — Telehealth: Payer: Self-pay | Admitting: Internal Medicine

## 2022-06-15 NOTE — Telephone Encounter (Signed)
Called patient regarding upcoming February-April appointments. Patient is notified.

## 2022-06-27 NOTE — Progress Notes (Unsigned)
[facility] OFFICE PROGRESS NOTE  Paul Noble, MD [address] [place] [address]  DIAGNOSIS:  1) recurrent non-small cell lung cancer, squamous cell carcinoma initially diagnosed as stage IIIb (T3, N2, M0) non-small cell lung cancer, squamous cell carcinoma presented with large right upper lobe perihilar mass with right hilar and subcarinal lymphadenopathy diagnosed in April 2022. 2) left lower extremity deep venous thrombosis diagnosed in October 2022.  Currently on treatment with Xarelto.  PRIOR THERAPY: 1) Concurrent chemoradiation with weekly carboplatin for AUC of 2 and paclitaxel 45 Mg/M2.  Status post 8 cycles.  Last dose was given November 10, 2020. 2) Consolidation treatment with immunotherapy with Imfinzi 1500 Mg IV every 4 weeks.  First dose December 14, 2020.  Status post 3 cycles.  This was discontinued secondary to intolerance with highly suspicious immunotherapy mediated pneumonitis.  CURRENT THERAPY: Systemic chemotherapy with carboplatin for AUC of 5 and paclitaxel 175 Mg/M2 with Neulasta support every 3 weeks. Status post 2 cycles.   INTERVAL HISTORY: Paul Mclean 62 y.o. male returns to the clinic for a follow up visit accompanied by his wife and daughter. The patient is feeling fair today without any concerning complaints except for increased fatigue today. He denies sick contacts, sore throat, nasal congestion, worsening shortness of breath, or changes in his cough.  The patient continues to tolerate treatment with chemotherapy well without any adverse effects except fatigue and worsening neuropathy.  He has some numbness and tingling in his hands and feet.  He takes 300 mg of gabapentin twice daily.  He states he is still able to perform fine motor functions with his hand such as holding objects and buttoning shirts.  Denies any fever, chills, or weight loss.  The patient has intermittent night sweats.  Following closely with palliative care regarding his  rib pain.  Sounds like his rib pain is controlled and he denies any rib pain at this time. He takes MS contin and MSIR.  Patient has baseline dyspnea on exertion which some days are worse than others.  Overall he denies any major changes in the frequency of having bad days.  Denies any significant cough.  He denies any nausea or vomiting.  He sometimes may have diarrhea after taking medications for constipation.  Denies any headache or visual changes.  Denies any rashes or skin changes.  Scheduled for restaging CT scan on 07/19/2022.  Patient currently is taking Lasix for lower extremity swelling daily.  Thinks the swelling is a little bit worse compared to his last appointment.  He is here today for evaluation repeat blood work before undergoing cycle #3.   MEDICAL HISTORY: Past Medical History:  Diagnosis Date   Atrial flutter ([facility])    question of brief aflutter 02/28/22 in setting of acute pericarditis (not captured on ECG)   Cancer ([facility])    right lung cancer   DVT (deep venous thrombosis) ([facility]) 02/27/2021   LLE partially occlusive DVT 02/25/21, started on Xarelto   Dyspnea    due to cancer per pt   History of radiation therapy 11/12/2020   right lung  09/30/2020-11/12/2020  Dr Gery Pray   Hypothyroidism    Thyroid disease     ALLERGIES:  has No Known Allergies.  MEDICATIONS:  Current Outpatient Medications  Medication Sig Dispense Refill   albuterol (VENTOLIN HFA) 108 (90 Base) MCG/ACT inhaler Inhale 2 puffs into the lungs every 6 (six) hours as needed for wheezing or shortness of breath. 8 g 2  Budeson-Glycopyrrol-Formoterol (BREZTRI AEROSPHERE) 160-9-4.8 MCG/ACT AERO Inhale 2 puffs into the lungs in the morning and at bedtime. 10.7 g 11   furosemide (LASIX) 20 MG tablet TAKE 1 TABLET BY MOUTH DAILY AS NEEDED FOR SWELLING 20 tablet 0   gabapentin (NEURONTIN) 300 MG capsule Take 1 capsule (300 mg total) by mouth 2 (two) times daily. 60 capsule 1   ibuprofen (ADVIL) 200 MG tablet Take  200 mg by mouth in the morning and at bedtime.     ipratropium-albuterol (DUONEB) 0.5-2.5 (3) MG/3ML SOLN Inhale 3 mLs into the lungs every 6 (six) hours as needed (wheezing/shortness of breath).     levothyroxine (SYNTHROID) 112 MCG tablet Take 112 mcg by mouth daily before breakfast.     morphine (MS CONTIN) 30 MG 12 hr tablet Take 1 tablet (30 mg total) by mouth every 8 (eight) hours. 90 tablet 0   morphine (MSIR) 15 MG tablet Take 1 tablet (15 mg total) by mouth every 4 (four) hours as needed for moderate pain or severe pain (pain.). 90 tablet 0   ondansetron (ZOFRAN) 8 MG tablet Take 1 tablet (8 mg total) by mouth every 8 (eight) hours as needed for nausea or vomiting. May take starting on day 3 following chemotherapy 30 tablet 1   pantoprazole (PROTONIX) 40 MG tablet Take 1 tablet (40 mg total) by mouth daily. 30 tablet 2   polyethylene glycol powder (MIRALAX) 17 GM/SCOOP powder Take 1 scoop (17GM) once daily. 255 g 0   potassium chloride (KLOR-CON M) 10 MEQ tablet Take 1 tablet (10 mEq total) by mouth daily. 5 tablet 0   predniSONE (DELTASONE) 10 MG tablet Take 5 mg by mouth in the morning.     XARELTO 20 MG TABS tablet TAKE 1 TABLET(20 MG) BY MOUTH DAILY WITH SUPPER (Patient taking differently: Take 20 mg by mouth in the morning.) 30 tablet 3   No current facility-administered medications for this visit.    SURGICAL HISTORY:  Past Surgical History:  Procedure Laterality Date   APPENDECTOMY     BRONCHIAL BIOPSY  09/01/2020   Procedure: BRONCHIAL BIOPSIES;  Surgeon: Garner Nash, DO;  Location: [facility];  Service: Pulmonary;;   BRONCHIAL BIOPSY  04/05/2022   Procedure: BRONCHIAL BIOPSIES;  Surgeon: Garner Nash, DO;  Location: [facility];  Service: Pulmonary;;   BRONCHIAL BRUSHINGS  09/01/2020   Procedure: BRONCHIAL BRUSHINGS;  Surgeon: Garner Nash, DO;  Location: [facility];  Service: Pulmonary;;   BRONCHIAL NEEDLE ASPIRATION BIOPSY  09/01/2020   Procedure:  BRONCHIAL NEEDLE ASPIRATION BIOPSIES;  Surgeon: Garner Nash, DO;  Location: [facility];  Service: Pulmonary;;   BRONCHIAL WASHINGS  09/01/2020   Procedure: BRONCHIAL WASHINGS;  Surgeon: Garner Nash, DO;  Location: [facility];  Service: Pulmonary;;   FINE NEEDLE ASPIRATION  04/05/2022   Procedure: FINE NEEDLE ASPIRATION (FNA) LINEAR;  Surgeon: Garner Nash, DO;  Location: [facility];  Service: Pulmonary;;   HERNIA REPAIR     INCISION AND DRAINAGE ABSCESS N/A 08/25/2017   Procedure: INCISION AND DRAINAGE perineal  ABSCESS;  Surgeon: Festus Aloe, MD;  Location: [facility];  Service: Urology;  Laterality: N/A;   VIDEO BRONCHOSCOPY WITH ENDOBRONCHIAL ULTRASOUND N/A 09/01/2020   Procedure: VIDEO BRONCHOSCOPY WITH ENDOBRONCHIAL ULTRASOUND;  Surgeon: Garner Nash, DO;  Location: [facility];  Service: Pulmonary;  Laterality: N/A;   VIDEO BRONCHOSCOPY WITH ENDOBRONCHIAL ULTRASOUND Right 04/05/2022   Procedure: VIDEO BRONCHOSCOPY WITH ENDOBRONCHIAL ULTRASOUND;  Surgeon: Garner Nash, DO;  Location: [facility];  Service: Pulmonary;  Laterality: Right;    REVIEW OF SYSTEMS:   Review of Systems  Constitutional: Positive for fatigue.  Negative for appetite change, chills, fever and unexpected weight change.  HENT: Negative for mouth sores, nosebleeds, sore throat and trouble swallowing.   Eyes: Negative for eye problems and icterus.  Respiratory: Also for intermittent dyspnea on exertion.  Negative for cough, hemoptysis, and wheezing.   Cardiovascular: Positive for intermittent rib pain although none at this time.  Positive for bilateral lower extremity swelling. Gastrointestinal: Negative for abdominal pain, constipation, diarrhea, nausea and vomiting.  Genitourinary: Negative for bladder incontinence, difficulty urinating, dysuria, frequency and hematuria.   Musculoskeletal: Negative for back pain, gait problem, neck pain and neck stiffness.  Skin: Negative for itching and  rash.  Neurological: Negative for dizziness, extremity weakness, gait problem, headaches, light-headedness and seizures.  Hematological: Negative for adenopathy. Does not bruise/bleed easily.  Psychiatric/Behavioral: Negative for confusion, depression and sleep disturbance. The patient is not nervous/anxious.     PHYSICAL EXAMINATION:  Blood pressure (!) 155/105, pulse (!) 105, temperature 98.1 F (36.7 C), resp. rate (!) 23, weight 211 lb 12.8 oz (96.1 kg), SpO2 95 %.  ECOG PERFORMANCE STATUS: 1  Physical Exam  Constitutional: Oriented to person, place, and time and well-developed, well-nourished, and in no distress.  HENT:  Head: Normocephalic and atraumatic.  Mouth/Throat: Oropharynx is clear and moist. No oropharyngeal exudate.  Eyes: Conjunctivae are normal. Right eye exhibits no discharge. Left eye exhibits no discharge. No scleral icterus.  Neck: Normal range of motion. Neck supple.  Cardiovascular: Normal rate, regular rhythm, normal heart sounds and intact distal pulses.   Pulmonary/Chest: Effort normal.  Quiet breath sounds bilaterally.  No respiratory distress. No wheezes. No rales.  Abdominal: Soft. Bowel sounds are normal. Exhibits no distension and no mass. There is no tenderness.  Musculoskeletal: Normal range of motion.  Mild bilateral lower extremity swelling bilaterally. Lymphadenopathy:    No cervical adenopathy.  Neurological: Alert and oriented to person, place, and time. Exhibits normal muscle tone. Gait normal. Coordination normal.  Skin: Skin is warm and dry. No rash noted. Not diaphoretic. No erythema. No pallor.  Psychiatric: Mood, memory and judgment normal.  Vitals reviewed.  LABORATORY DATA: Lab Results  Component Value Date   WBC 5.1 06/29/2022   HGB 12.5 (L) 06/29/2022   HCT 36.6 (L) 06/29/2022   MCV 88.4 06/29/2022   PLT 172 06/29/2022      Chemistry      Component Value Date/Time   NA 138 06/29/2022 0849   K 3.3 (L) 06/29/2022 0849   CL  101 06/29/2022 0849   CO2 31 06/29/2022 0849   BUN 13 06/29/2022 0849   CREATININE 0.67 06/29/2022 0849      Component Value Date/Time   CALCIUM 8.8 (L) 06/29/2022 0849   ALKPHOS 96 06/29/2022 0849   AST 34 06/29/2022 0849   ALT 52 (H) 06/29/2022 0849   BILITOT 0.4 06/29/2022 0849       RADIOGRAPHIC STUDIES:  No results found.   ASSESSMENT/PLAN:  This is a very pleasant 62 year old Caucasian male with recurrent lung cancer, initially diagnosed with stage IIIb (T3, N2, M0) non-small cell lung cancer, squamous cell carcinoma.  The patient presented with a large right upper lobe perihilar mass with right hilar and subcarinal lymphadenopathy.  The patient was diagnosed in April 2022.  The patient completed a course of concurrent chemoradiation with weekly carboplatin and paclitaxel.  The patient is currently status post 8  cycles. His last dose was on 11/10/20.   He then was on immunotherapy with Imfinzi 1500 mg IV every 4 weeks.  He is status post 3 cycles.  It was discontinued secondary to concerns for pneumonitis.   He had been on observation until his PCP at [facility] ordered a CT scan which showed consolidation in the right hilum concerning for a lung mass.   Dr. Valeta Harms arrange for a bronchoscopy on 04/05/2022 and the final pathology was consistent with squamous cell carcinoma. The patient also had a PET scan and MRI performed recently. His scan showed worsening and progressive hypermetabolic activity in the right upper lobe/hilar mass concerning for progressive disease locally.   He was seen by Dr. Sondra Come for consideration of palliative radiotherapy to the recurrent disease in the right lung but Dr. Sondra Come did not feel that he can offer the patient any additional radiation because of the high-dose he received in the past. The patient started systemic chemotherapy with carboplatin for AUC of 5 and paclitaxel 175 Mg/M2 IV every 3 weeks with Neulasta support last week.  Status post  2 cycles.   Reviewed the patient's neuropathy with Dr. Julien Nordmann.  I reviewed the patient's dose of Taxol.  Dr. Julien Nordmann recommends keeping the dose of Taxol the same for now.  The patient expected to have a restaging CT scan of the chest prior to his next appointment.  We will make the decision at his next appointment about additional dose adjustments or the total number of cycles of treatment that we are planning based on the scan results.  Labs were reviewed. Recommend he proceed with cycle #3 today as scheduled.   We will see him back for a follow up visit in 3 weeks for evaluation and repeat blood work before starting cycle #4.   For the pain management, he will continue on MS Contin as well as MS IR.  He is also seen by the palliative care team for management of his pain issues.  Scheduled to see them today. For the swelling of the lower extremities he continues to take Lasix treatment.  Potassium is 3.1.  He takes Lasix daily.  I will send him a short course of potassium for meds for the next 5 days.  He was also given a handout of potassium rich food and encouraged to increase his dietary intake of potassium rich food while he takes Lasix.  I also recommended that he elevate his legs, use compression stockings, and avoid salty foods. For the history of pulmonary embolism, he will continue his current treatment with Xarelto.  The patient was advised to call immediately if he has any concerning symptoms in the interval. The patient voices understanding of current disease status and treatment options and is in agreement with the current care plan. All questions were answered. The patient knows to call the clinic with any problems, questions or concerns. We can certainly see the patient much sooner if necessary       No orders of the defined types were placed in this encounter.    The total time spent in the appointment was 20-29 minutes  [place], PA-C 06/29/22

## 2022-06-28 ENCOUNTER — Other Ambulatory Visit: Payer: Self-pay | Admitting: Medical Oncology

## 2022-06-28 DIAGNOSIS — C3491 Malignant neoplasm of unspecified part of right bronchus or lung: Secondary | ICD-10-CM

## 2022-06-28 DIAGNOSIS — Z515 Encounter for palliative care: Secondary | ICD-10-CM

## 2022-06-28 MED ORDER — FUROSEMIDE 20 MG PO TABS: ORAL_TABLET | ORAL | 0 refills | Status: DC

## 2022-06-28 MED FILL — Fosaprepitant Dimeglumine For IV Infusion 150 MG (Base Eq): INTRAVENOUS | Qty: 5 | Status: AC

## 2022-06-28 MED FILL — Dexamethasone Sodium Phosphate Inj 100 MG/10ML: INTRAMUSCULAR | Qty: 1 | Status: AC

## 2022-06-29 ENCOUNTER — Inpatient Hospital Stay: Payer: Managed Care, Other (non HMO)

## 2022-06-29 ENCOUNTER — Inpatient Hospital Stay (HOSPITAL_BASED_OUTPATIENT_CLINIC_OR_DEPARTMENT_OTHER): Payer: Managed Care, Other (non HMO) | Admitting: Nurse Practitioner

## 2022-06-29 ENCOUNTER — Inpatient Hospital Stay: Payer: Managed Care, Other (non HMO) | Admitting: Physician Assistant

## 2022-06-29 ENCOUNTER — Encounter: Payer: Self-pay | Admitting: Nurse Practitioner

## 2022-06-29 VITALS — BP 155/105 | HR 105 | Temp 98.1°F | Resp 23 | Wt 211.8 lb

## 2022-06-29 VITALS — BP 150/102 | HR 106 | Temp 97.8°F | Resp 20

## 2022-06-29 DIAGNOSIS — Z5111 Encounter for antineoplastic chemotherapy: Secondary | ICD-10-CM | POA: Diagnosis not present

## 2022-06-29 DIAGNOSIS — E876 Hypokalemia: Secondary | ICD-10-CM

## 2022-06-29 DIAGNOSIS — G893 Neoplasm related pain (acute) (chronic): Secondary | ICD-10-CM | POA: Diagnosis not present

## 2022-06-29 DIAGNOSIS — R530 Neoplastic (malignant) related fatigue: Secondary | ICD-10-CM | POA: Diagnosis not present

## 2022-06-29 DIAGNOSIS — C3491 Malignant neoplasm of unspecified part of right bronchus or lung: Secondary | ICD-10-CM | POA: Diagnosis not present

## 2022-06-29 DIAGNOSIS — T451X5A Adverse effect of antineoplastic and immunosuppressive drugs, initial encounter: Secondary | ICD-10-CM

## 2022-06-29 DIAGNOSIS — M792 Neuralgia and neuritis, unspecified: Secondary | ICD-10-CM | POA: Diagnosis not present

## 2022-06-29 DIAGNOSIS — R630 Anorexia: Secondary | ICD-10-CM

## 2022-06-29 DIAGNOSIS — Z515 Encounter for palliative care: Secondary | ICD-10-CM

## 2022-06-29 LAB — CBC WITH DIFFERENTIAL (CANCER CENTER ONLY)
Abs Immature Granulocytes: 0.04 10*3/uL (ref 0.00–0.07)
Basophils Absolute: 0.1 10*3/uL (ref 0.0–0.1)
Basophils Relative: 1 %
Eosinophils Absolute: 0 10*3/uL (ref 0.0–0.5)
Eosinophils Relative: 1 %
HCT: 36.6 % — ABNORMAL LOW (ref 39.0–52.0)
Hemoglobin: 12.5 g/dL — ABNORMAL LOW (ref 13.0–17.0)
Immature Granulocytes: 1 %
Lymphocytes Relative: 25 %
Lymphs Abs: 1.3 10*3/uL (ref 0.7–4.0)
MCH: 30.2 pg (ref 26.0–34.0)
MCHC: 34.2 g/dL (ref 30.0–36.0)
MCV: 88.4 fL (ref 80.0–100.0)
Monocytes Absolute: 0.8 10*3/uL (ref 0.1–1.0)
Monocytes Relative: 16 %
Neutro Abs: 2.9 10*3/uL (ref 1.7–7.7)
Neutrophils Relative %: 56 %
Platelet Count: 172 10*3/uL (ref 150–400)
RBC: 4.14 MIL/uL — ABNORMAL LOW (ref 4.22–5.81)
RDW: 15.9 % — ABNORMAL HIGH (ref 11.5–15.5)
WBC Count: 5.1 10*3/uL (ref 4.0–10.5)
nRBC: 0 % (ref 0.0–0.2)

## 2022-06-29 LAB — CMP (CANCER CENTER ONLY)
ALT: 52 U/L — ABNORMAL HIGH (ref 0–44)
AST: 34 U/L (ref 15–41)
Albumin: 3.4 g/dL — ABNORMAL LOW (ref 3.5–5.0)
Alkaline Phosphatase: 96 U/L (ref 38–126)
Anion gap: 6 (ref 5–15)
BUN: 13 mg/dL (ref 6–20)
CO2: 31 mmol/L (ref 22–32)
Calcium: 8.8 mg/dL — ABNORMAL LOW (ref 8.9–10.3)
Chloride: 101 mmol/L (ref 98–111)
Creatinine: 0.67 mg/dL (ref 0.61–1.24)
GFR, Estimated: >60 mL/min (ref >60)
Glucose, Bld: 116 mg/dL — ABNORMAL HIGH (ref 70–99)
Potassium: 3.3 mmol/L — ABNORMAL LOW (ref 3.5–5.1)
Sodium: 138 mmol/L (ref 135–145)
Total Bilirubin: 0.4 mg/dL (ref 0.3–1.2)
Total Protein: 6.9 g/dL (ref 6.5–8.1)

## 2022-06-29 MED ORDER — FAMOTIDINE IN NACL 20-0.9 MG/50ML-% IV SOLN
20.0000 mg | Freq: Once | INTRAVENOUS | Status: AC | PRN
  Administered 2022-06-29: 20 mg via INTRAVENOUS

## 2022-06-29 MED ORDER — POTASSIUM CHLORIDE CRYS ER 10 MEQ PO TBCR: 10.0000 meq | EXTENDED_RELEASE_TABLET | Freq: Every day | ORAL | 0 refills | Status: DC

## 2022-06-29 MED ORDER — DIPHENHYDRAMINE HCL 50 MG/ML IJ SOLN
50.0000 mg | Freq: Once | INTRAMUSCULAR | Status: AC
  Administered 2022-06-29: 50 mg via INTRAVENOUS
  Filled 2022-06-29: qty 1

## 2022-06-29 MED ORDER — SODIUM CHLORIDE 0.9 % IV SOLN
200.0000 mg/m2 | Freq: Once | INTRAVENOUS | Status: AC
  Administered 2022-06-29: 432 mg via INTRAVENOUS
  Filled 2022-06-29: qty 72

## 2022-06-29 MED ORDER — SODIUM CHLORIDE 0.9 % IV SOLN
750.0000 mg | Freq: Once | INTRAVENOUS | Status: AC
  Administered 2022-06-29: 750 mg via INTRAVENOUS
  Filled 2022-06-29: qty 75

## 2022-06-29 MED ORDER — SODIUM CHLORIDE 0.9 % IV SOLN
150.0000 mg | Freq: Once | INTRAVENOUS | Status: AC
  Administered 2022-06-29: 150 mg via INTRAVENOUS
  Filled 2022-06-29: qty 150

## 2022-06-29 MED ORDER — FAMOTIDINE IN NACL 20-0.9 MG/50ML-% IV SOLN
20.0000 mg | Freq: Once | INTRAVENOUS | Status: AC
  Administered 2022-06-29: 20 mg via INTRAVENOUS
  Filled 2022-06-29: qty 50

## 2022-06-29 MED ORDER — METHYLPREDNISOLONE SODIUM SUCC 125 MG IJ SOLR
125.0000 mg | Freq: Once | INTRAMUSCULAR | Status: AC | PRN
  Administered 2022-06-29: 125 mg via INTRAVENOUS

## 2022-06-29 MED ORDER — MORPHINE SULFATE ER 30 MG PO TBCR: 30.0000 mg | EXTENDED_RELEASE_TABLET | Freq: Three times a day (TID) | ORAL | 0 refills | Status: DC

## 2022-06-29 MED ORDER — SODIUM CHLORIDE 0.9 % IV SOLN
10.0000 mg | Freq: Once | INTRAVENOUS | Status: AC
  Administered 2022-06-29: 10 mg via INTRAVENOUS
  Filled 2022-06-29: qty 10

## 2022-06-29 MED ORDER — SODIUM CHLORIDE 0.9 % IV SOLN: Freq: Once | INTRAVENOUS | Status: AC

## 2022-06-29 MED ORDER — PALONOSETRON HCL INJECTION 0.25 MG/5ML
0.2500 mg | Freq: Once | INTRAVENOUS | Status: AC
  Administered 2022-06-29: 0.25 mg via INTRAVENOUS
  Filled 2022-06-29: qty 5

## 2022-06-29 MED ORDER — GABAPENTIN 300 MG PO CAPS: ORAL_CAPSULE | ORAL | 1 refills | Status: DC

## 2022-06-29 MED ORDER — SODIUM CHLORIDE 0.9 % IV SOLN: Freq: Once | INTRAVENOUS | Status: DC | PRN

## 2022-06-29 NOTE — Patient Instructions (Signed)
[facility]  Discharge Instructions: Thank you for choosing [facility] to provide your oncology and hematology care.   If you have a lab appointment with the [facility], please go directly to the [facility] and check in at the registration area.   Wear comfortable clothing and clothing appropriate for easy access to any Portacath or PICC line.   We strive to give you quality time with your provider. You may need to reschedule your appointment if you arrive late (15 or more minutes).  Arriving late affects you and other patients whose appointments are after yours.  Also, if you miss three or more appointments without notifying the office, you may be dismissed from the clinic at the provider's discretion.      For prescription refill requests, have your pharmacy contact our office and allow 72 hours for refills to be completed.    Today you received the following chemotherapy and/or immunotherapy agents Taxol and Carboplatin.      To help prevent nausea and vomiting after your treatment, we encourage you to take your nausea medication as directed.  BELOW ARE SYMPTOMS THAT SHOULD BE REPORTED IMMEDIATELY: *FEVER GREATER THAN 100.4 F (38 C) OR HIGHER *CHILLS OR SWEATING *NAUSEA AND VOMITING THAT IS NOT CONTROLLED WITH YOUR NAUSEA MEDICATION *UNUSUAL SHORTNESS OF BREATH *UNUSUAL BRUISING OR BLEEDING *URINARY PROBLEMS (pain or burning when urinating, or frequent urination) *BOWEL PROBLEMS (unusual diarrhea, constipation, pain near the anus) TENDERNESS IN MOUTH AND THROAT WITH OR WITHOUT PRESENCE OF ULCERS (sore throat, sores in mouth, or a toothache) UNUSUAL RASH, SWELLING OR PAIN  UNUSUAL VAGINAL DISCHARGE OR ITCHING   Items with * indicate a potential emergency and should be followed up as soon as possible or go to the Emergency Department if any problems should occur.  Please show the CHEMOTHERAPY ALERT CARD or IMMUNOTHERAPY ALERT  CARD at check-in to the Emergency Department and triage nurse.  Should you have questions after your visit or need to cancel or reschedule your appointment, please contact [facility]  Dept: [phone]  and follow the prompts.  Office hours are 8:00 a.m. to 4:30 p.m. Monday - Friday. Please note that voicemails left after 4:00 p.m. may not be returned until the following business day.  We are closed weekends and major holidays. You have access to a nurse at all times for urgent questions. Please call the main number to the clinic Dept: [phone] and follow the prompts.   For any non-urgent questions, you may also contact your provider using MyChart. We now offer e-Visits for anyone 63 and older to request care online for non-urgent symptoms. For details visit [email or web address].   Also download the MyChart app! Go to the app store, search "MyChart", open the app, select [facility], and log in with your MyChart username and password.

## 2022-06-29 NOTE — Progress Notes (Signed)
Patient seen by PA today  Vitals are within treatment parameters.  Labs reviewed: and are within treatment parameters.  Per physician team, patient is ready for treatment and there are NO modifications to the treatment plan.

## 2022-06-29 NOTE — Progress Notes (Signed)
Hypersensitivity Reaction note  Date of event: 06/29/22 Time of event: 1600 Generic name of drug involved: Carboplatin Name of provider notified of the hypersensitivity reaction: Gregor Hams, PA Was agent that likely caused hypersensitivity reaction added to Allergies List within EMR? Yes Chain of events including reaction signs/symptoms, treatment administered, and outcome (e.g., drug resumed; drug discontinued; sent to Emergency Department; etc.) At completion of 11th Carboplatin dose, patient noted to have increased shortness of breath, wheezing, diaphoresis & flushing. Vital signs obtained with BP elevated and tachycardia (HR115, temp 98.98F, BP 158/99, 94 on room air). Paul Mclean, Paul Mclean notified. 20mg  Pepcid, 125mg  Solumedrol, 1L NS open to gravity given. Patient placed on 2L O2 with continuous oxygen monitoring applied. After approximately 15 minutes following medication administration, patient verbalizes that he feels "better", flushing resolved. VS 152/109, HR 103, O2 95% on room air. Pt endorses feeling less flushed and shortness of breath resolved, breath sounds audibly clearer. Paul Mclean, Paul Mclean cleared for discharge. Patient left via wheelchair with family.   Paul Fraction, RN 06/29/2022 4:38 PM

## 2022-06-29 NOTE — Patient Instructions (Signed)
-   increase your gabapentin to one in the morning and two at night to help with pain and sleep - pick up and take the extra strength melatonin (its over the counter) to help with sleep  - call us with any questions or concerns

## 2022-06-29 NOTE — Progress Notes (Signed)
[place]  [phone] [phone]   Name: Paul Mclean Date: 06/29/2022 MRN: [id number]  DOB: May 09, 1961  Patient Care Team: [name], MD as PCP - General (Internal Medicine) Pickenpack-Cousar, Carlena Sax, NP as Nurse Practitioner (Nurse Practitioner)    INTERVAL HISTORY: Paul Mclean is a 62 y.o. 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  Palliative ask to see for pain management and goals of care.  SOCIAL HISTORY:     reports that Paul Mclean quit smoking about 16 years ago. His smoking use included cigarettes. Paul Mclean has a 60.00 pack-year smoking history. Paul Mclean has never used smokeless tobacco. Paul Mclean reports current alcohol use of about 1.0 - 2.0 standard drink of alcohol per week. Paul Mclean reports that Paul Mclean does not use drugs.  ADVANCE DIRECTIVES:  Advanced directives on file  CODE STATUS: Full code  PAST MEDICAL HISTORY: Past Medical History:  Diagnosis Date   Atrial flutter ([place])    question of brief aflutter 02/28/22 in setting of acute pericarditis (not captured on ECG)   Cancer ([place])    right lung cancer   DVT (deep venous thrombosis) ([place]) 02/27/2021   LLE partially occlusive DVT 02/25/21, started on Xarelto   Dyspnea    due to cancer per pt   History of radiation therapy 11/12/2020   right lung  09/30/2020-11/12/2020  Dr Gery Pray   Hypothyroidism    Thyroid disease     ALLERGIES:  has No Known Allergies.  MEDICATIONS:  Current Outpatient Medications  Medication Sig Dispense Refill   albuterol (VENTOLIN HFA) 108 (90 Base) MCG/ACT inhaler Inhale 2 puffs into the lungs every 6 (six) hours as needed for wheezing or shortness of breath. 8 g 2   Budeson-Glycopyrrol-Formoterol (BREZTRI AEROSPHERE) 160-9-4.8 MCG/ACT AERO Inhale 2 puffs into the lungs in the morning and at bedtime. 10.7 g 11   furosemide (LASIX) 20 MG  tablet TAKE 1 TABLET BY MOUTH DAILY AS NEEDED FOR SWELLING 20 tablet 0   gabapentin (NEURONTIN) 300 MG capsule Take 1 capsule (300 mg total) by mouth 2 (two) times daily. 60 capsule 1   ibuprofen (ADVIL) 200 MG tablet Take 200 mg by mouth in the morning and at bedtime.     ipratropium-albuterol (DUONEB) 0.5-2.5 (3) MG/3ML SOLN Inhale 3 mLs into the lungs every 6 (six) hours as needed (wheezing/shortness of breath).     levothyroxine (SYNTHROID) 112 MCG tablet Take 112 mcg by mouth daily before breakfast.     morphine (MS CONTIN) 30 MG 12 hr tablet Take 1 tablet (30 mg total) by mouth every 8 (eight) hours. 90 tablet 0   morphine (MSIR) 15 MG tablet Take 1 tablet (15 mg total) by mouth every 4 (four) hours as needed for moderate pain or severe pain (pain.). 90 tablet 0   ondansetron (ZOFRAN) 8 MG tablet Take 1 tablet (8 mg total) by mouth every 8 (eight) hours as needed for nausea or vomiting. May take starting on day 3 following chemotherapy 30 tablet 1   pantoprazole (PROTONIX) 40 MG tablet Take 1 tablet (40 mg total) by mouth daily. 30 tablet 2   polyethylene glycol powder (MIRALAX) 17 GM/SCOOP powder Take 1 scoop (17GM) once daily. 255 g 0   predniSONE (DELTASONE) 10 MG tablet Take 5 mg by mouth in the morning.     XARELTO 20 MG TABS tablet TAKE  1 TABLET(20 MG) BY MOUTH DAILY WITH SUPPER (Patient taking differently: Take 20 mg by mouth in the morning.) 30 tablet 3   No current facility-administered medications for this visit.    VITAL SIGNS: There were no vitals taken for this visit. There were no vitals filed for this visit.  Estimated body mass index is 30.41 kg/m as calculated from the following:   Height as of 06/08/22: 5\' 9"  (1.753 m).   Weight as of 06/08/22: 205 lb 14.4 oz (93.4 kg).   PERFORMANCE STATUS (ECOG) : 1 - Symptomatic but completely ambulatory   Physical Exam General: NAD Cardiovascular: regular rate and rhythm Pulmonary: normal breathing pattern  Abdomen: soft,  nontender, + bowel sounds Extremities: no edema, no joint deformities Skin: no rashes Neurological: AAO x3   IMPRESSION: Paul Mclean presents to clinic today for symptom management follow-up. His family is present (daughter/wife). No acute distress noted. Remaining as active as possible. Reports overall doing well and feeling much better than previous weeks.    Neoplasm related pain Paul Mclean continues to express appreciation of pain being well controlled on current regimen. His previously expressed pain goal was 5 out of 10. Paul Mclean is happy to report that with current regimen Paul Mclean has obtained this goal and would like to remain the course. Quality of life has improved. Paul Mclean does share that his neuropathic pain seems to be more intense during the later evening night time hours. This may be due to increased activity.    We discussed at length his current regimen: MS Contin 30 mg three times daily and MS IR 15 mg every 4-6 hours as needed for breakthrough pain. Gabapentin 300mg  twice daily. Paul Mclean is not requiring MS IR daily. We discussed his gabapentin dosing. Instructed to increase night dose to 600 mg to offer additional relief.    We will continue to closely monitor and support .   Decreased appetite/weight loss Appetite is much improved. Weight today is up to 211lbs from 205lbs on 1/31.   I discussed the importance of continued conversation with family and their medical providers regarding overall plan of care and treatment options, ensuring decisions are within the context of the patients values and GOCs.  PLAN:  MS Contin 30 mg three times daily MS IR 15mg  every 4-6 hours as needed for breakthrough pain. Not requiring daily Gabapentin 300mg  daily and 600mg  at bedtime Protonix 40mg  daily Miralax daily for bowel regimen We will continue to closely monitor and assist with symptom management.  I will plan to see patient back in 3-4 weeks in collaboration to other oncology appointments. Patient  and family knows to contact office if needed sooner.    Patient expressed understanding and was in agreement with this plan. Paul Mclean also understands that Paul Mclean can call the clinic at any time with any questions, concerns, or complaints.       Any controlled substances utilized were prescribed in the context of palliative care. PDMP has been reviewed.    Time Total: 30 min   Visit consisted of counseling and education dealing with the complex and emotionally intense issues of symptom management and palliative care in the setting of serious and potentially life-threatening illness.Greater than 50%  of this time was spent counseling and coordinating care related to the above assessment and plan.  Alda Lea, AGPCNP-BC  Palliative Medicine Team/[place]

## 2022-06-29 NOTE — Progress Notes (Signed)
    DATE:  06/29/22                                        X CHEMO/IMMUNOTHERAPY REACTION           MD: Julien Nordmann   AGENT/BLOOD PRODUCT RECEIVING TODAY:              taxol and carboplatin   AGENT/BLOOD PRODUCT RECEIVING IMMEDIATELY PRIOR TO REACTION:         carboplatin   VS: BP:     139/102   P:       107       SPO2:       95% RA                BP:     151/108   P:       103       SPO2:       97% RA     REACTION(S):            shortness of breath, flushing, diaphoresis   PREMEDS:     decadron 10 mg IV, Pepcid 20 mg IV, Emend 150 mg IV, Aloxi 0.25 mg IV, benadryl 50 mg IV   INTERVENTION: Pepcid 20 mg IV, solu-medrol 125 mg IV   Review of Systems  Review of Systems  Constitutional:  Positive for diaphoresis.  Respiratory:  Positive for shortness of breath.   Skin:  Positive for color change.  All other systems reviewed and are negative.    Physical Exam  Physical Exam Vitals and nursing note reviewed.  Constitutional:      Appearance: He is well-developed. He is diaphoretic. He is not ill-appearing or toxic-appearing.  HENT:     Head: Normocephalic.     Nose: Nose normal.  Eyes:     Conjunctiva/sclera: Conjunctivae normal.  Neck:     Vascular: No JVD.  Cardiovascular:     Rate and Rhythm: Regular rhythm. Tachycardia present.     Pulses: Normal pulses.     Heart sounds: Normal heart sounds.  Pulmonary:     Effort: Pulmonary effort is normal.     Comments: Decreased lung sounds in bilateral posterior bases Abdominal:     General: There is no distension.  Musculoskeletal:     Cervical back: Normal range of motion.  Skin:    General: Skin is warm.     Findings: Erythema (facial flushing) present.  Neurological:     Mental Status: He is oriented to person, place, and time.     OUTCOME:        Patient with reaction during 11th carbo infusion. Medications given and he returned to baseline. Patient had elevated blood pressure and tachycardia on arrival, vitals  similar at time of discharge. Oncology team made aware of reaction.

## 2022-07-01 ENCOUNTER — Inpatient Hospital Stay: Payer: Managed Care, Other (non HMO)

## 2022-07-01 VITALS — BP 147/101 | HR 95 | Temp 90.0°F | Resp 18

## 2022-07-01 DIAGNOSIS — C3491 Malignant neoplasm of unspecified part of right bronchus or lung: Secondary | ICD-10-CM

## 2022-07-01 DIAGNOSIS — Z5111 Encounter for antineoplastic chemotherapy: Secondary | ICD-10-CM | POA: Diagnosis not present

## 2022-07-01 MED ORDER — PEGFILGRASTIM INJECTION 6 MG/0.6ML ~~LOC~~
6.0000 mg | PREFILLED_SYRINGE | Freq: Once | SUBCUTANEOUS | Status: AC
  Administered 2022-07-01: 6 mg via SUBCUTANEOUS
  Filled 2022-07-01: qty 0.6

## 2022-07-02 ENCOUNTER — Other Ambulatory Visit: Payer: Self-pay | Admitting: Physician Assistant

## 2022-07-02 DIAGNOSIS — E876 Hypokalemia: Secondary | ICD-10-CM

## 2022-07-08 ENCOUNTER — Telehealth: Payer: Self-pay

## 2022-07-08 NOTE — Telephone Encounter (Signed)
Attempted to call pt to check in, no answer, LVM

## 2022-07-18 NOTE — Progress Notes (Unsigned)
[facility] OFFICE PROGRESS NOTE  [name], MD [address] [place] [address]  DIAGNOSIS:  1) recurrent non-small cell lung cancer, squamous cell carcinoma initially diagnosed as stage IIIb (T3, N2, M0) non-small cell lung cancer, squamous cell carcinoma presented with large right upper lobe perihilar mass with right hilar and subcarinal lymphadenopathy diagnosed in April 2022. 2) left lower extremity deep venous thrombosis diagnosed in October 2022.  Currently on treatment with Xarelto.  PRIOR THERAPY: 1) Concurrent chemoradiation with weekly carboplatin for AUC of 2 and paclitaxel 45 Mg/M2.  Status post 8 cycles.  Last dose was given November 10, 2020. 2) Consolidation treatment with immunotherapy with Imfinzi 1500 Mg IV every 4 weeks.  First dose December 14, 2020.  Status post 3 cycles.  This was discontinued secondary to intolerance with highly suspicious immunotherapy mediated pneumonitis.  CURRENT THERAPY: Systemic chemotherapy with carboplatin for AUC of 5 and paclitaxel 175 Mg/M2 with Neulasta support every 3 weeks. Status post 3 cycles. Carboplatin discontinued starting from cycle #4 due to infusion reaction.   INTERVAL HISTORY: Paul Mclean 62 y.o. male returns to the clinic today for a follow-up visit accompanied by his daughter.  The patient is currently undergoing systemic chemotherapy.  Unfortunately, the patient's last appointment, he had a significant reaction to carboplatin with diaphoresis, erythema of the face/flushing/tachycardia, and shortness of breath.  Therefore, carboplatin was discontinued from the care plan.  The patient is on single agent Taxol although he also has some significant peripheral neuropathy for which he takes gabapentin 300 mg in the AM and 600 mg in the PM.  His neuropathy is stable. He still able to perform fine motor functions.  Otherwise he tolerated his last chemotherapy fairly well except for fatigue.  He denies any fever or  chills.  He is not having night sweats as often.  It appears he has lost weight although he reports he has a good appetite.  The patient is followed closely by palliative care for his rib pain.  Her pain is better controlled at this time.  He is scheduled to see them next today.  He reports baseline dyspnea on exertion which some days are better than others.  Denies any significant cough. Denies any nausea or vomiting.  Denies any recent diarrhea or constipation.  Denies any headache or visual changes. The patient recently had a restaging CT scan.  He is here today for evaluation and to review his scan results before considering starting cycle #4.    MEDICAL HISTORY: Past Medical History:  Diagnosis Date   Atrial flutter ([place])    question of brief aflutter 02/28/22 in setting of acute pericarditis (not captured on ECG)   Cancer ([facility] [place])    right lung cancer   DVT (deep venous thrombosis) ([facility]) 02/27/2021   LLE partially occlusive DVT 02/25/21, started on Xarelto   Dyspnea    due to cancer per pt   History of radiation therapy 11/12/2020   right lung  09/30/2020-11/12/2020  Dr Gery Pray   Hypothyroidism    Thyroid disease     ALLERGIES:  is allergic to carboplatin.  MEDICATIONS:  Current Outpatient Medications  Medication Sig Dispense Refill   albuterol (VENTOLIN HFA) 108 (90 Base) MCG/ACT inhaler Inhale 2 puffs into the lungs every 6 (six) hours as needed for wheezing or shortness of breath. 8 g 2   Budeson-Glycopyrrol-Formoterol (BREZTRI AEROSPHERE) 160-9-4.8 MCG/ACT AERO Inhale 2 puffs into the lungs in the morning and at bedtime. 10.7 g  11   furosemide (LASIX) 20 MG tablet TAKE 1 TABLET BY MOUTH DAILY AS NEEDED FOR SWELLING 20 tablet 0   gabapentin (NEURONTIN) 300 MG capsule Take 1 capsule in the morning and 2 capsules at bedtime. 90 capsule 1   ibuprofen (ADVIL) 200 MG tablet Take 200 mg by mouth in the morning and at bedtime.     ipratropium-albuterol (DUONEB) 0.5-2.5 (3) MG/3ML SOLN  Inhale 3 mLs into the lungs every 6 (six) hours as needed (wheezing/shortness of breath).     levothyroxine (SYNTHROID) 112 MCG tablet Take 112 mcg by mouth daily before breakfast.     morphine (MS CONTIN) 30 MG 12 hr tablet Take 1 tablet (30 mg total) by mouth every 8 (eight) hours. 90 tablet 0   morphine (MSIR) 15 MG tablet Take 1 tablet (15 mg total) by mouth every 4 (four) hours as needed for moderate pain or severe pain (pain.). 90 tablet 0   ondansetron (ZOFRAN) 8 MG tablet Take 1 tablet (8 mg total) by mouth every 8 (eight) hours as needed for nausea or vomiting. May take starting on day 3 following chemotherapy 30 tablet 1   pantoprazole (PROTONIX) 40 MG tablet Take 1 tablet (40 mg total) by mouth daily. 30 tablet 2   polyethylene glycol powder (MIRALAX) 17 GM/SCOOP powder Take 1 scoop (17GM) once daily. 255 g 0   potassium chloride (KLOR-CON M) 10 MEQ tablet Take 1 tablet (10 mEq total) by mouth daily. 5 tablet 0   predniSONE (DELTASONE) 10 MG tablet Take 5 mg by mouth in the morning.     XARELTO 20 MG TABS tablet TAKE 1 TABLET(20 MG) BY MOUTH DAILY WITH SUPPER (Patient taking differently: Take 20 mg by mouth in the morning.) 30 tablet 3   No current facility-administered medications for this visit.    SURGICAL HISTORY:  Past Surgical History:  Procedure Laterality Date   APPENDECTOMY     BRONCHIAL BIOPSY  09/01/2020   Procedure: BRONCHIAL BIOPSIES;  Surgeon: Garner Nash, DO;  Location: [facility];  Service: Pulmonary;;   BRONCHIAL BIOPSY  04/05/2022   Procedure: BRONCHIAL BIOPSIES;  Surgeon: Garner Nash, DO;  Location: [facility];  Service: Pulmonary;;   BRONCHIAL BRUSHINGS  09/01/2020   Procedure: BRONCHIAL BRUSHINGS;  Surgeon: Garner Nash, DO;  Location: [facility];  Service: Pulmonary;;   BRONCHIAL NEEDLE ASPIRATION BIOPSY  09/01/2020   Procedure: BRONCHIAL NEEDLE ASPIRATION BIOPSIES;  Surgeon: Garner Nash, DO;  Location: [facility];  Service:  Pulmonary;;   BRONCHIAL WASHINGS  09/01/2020   Procedure: BRONCHIAL WASHINGS;  Surgeon: Garner Nash, DO;  Location: [facility];  Service: Pulmonary;;   FINE NEEDLE ASPIRATION  04/05/2022   Procedure: FINE NEEDLE ASPIRATION (FNA) LINEAR;  Surgeon: Garner Nash, DO;  Location: [facility];  Service: Pulmonary;;   HERNIA REPAIR     INCISION AND DRAINAGE ABSCESS N/A 08/25/2017   Procedure: INCISION AND DRAINAGE perineal  ABSCESS;  Surgeon: Festus Aloe, MD;  Location: [facility];  Service: Urology;  Laterality: N/A;   VIDEO BRONCHOSCOPY WITH ENDOBRONCHIAL ULTRASOUND N/A 09/01/2020   Procedure: VIDEO BRONCHOSCOPY WITH ENDOBRONCHIAL ULTRASOUND;  Surgeon: Garner Nash, DO;  Location: [facility];  Service: Pulmonary;  Laterality: N/A;   VIDEO BRONCHOSCOPY WITH ENDOBRONCHIAL ULTRASOUND Right 04/05/2022   Procedure: VIDEO BRONCHOSCOPY WITH ENDOBRONCHIAL ULTRASOUND;  Surgeon: Garner Nash, DO;  Location: [facility];  Service: Pulmonary;  Laterality: Right;    REVIEW OF SYSTEMS:   Review of Systems  Constitutional: Positive  for fatigue and weight loss. Negative for appetite change, chills and fever.  HENT: Negative for mouth sores, nosebleeds, sore throat and trouble swallowing.   Eyes: Negative for eye problems and icterus.  Respiratory: Positive for intermittent dyspnea on exertion.  Negative for cough, hemoptysis, and wheezing.   Cardiovascular: Negative for chest pain and leg swelling.  Gastrointestinal: Negative for abdominal pain, constipation, diarrhea, nausea and vomiting.  Genitourinary: Negative for bladder incontinence, difficulty urinating, dysuria, frequency and hematuria.   Musculoskeletal: Negative for back pain, gait problem, neck pain and neck stiffness.  Skin: Negative for itching and rash.  Neurological: Positive for peripheral neuropathy.  Negative for dizziness, extremity weakness, gait problem, headaches, light-headedness and seizures.  Hematological:  Negative for adenopathy. Does not bruise/bleed easily.  Psychiatric/Behavioral: Negative for confusion, depression and sleep disturbance. The patient is not nervous/anxious.     PHYSICAL EXAMINATION:  Blood pressure (!) 148/97, pulse 98, temperature 98.1 F (36.7 C), resp. rate 20, weight 204 lb 14.4 oz (92.9 kg), SpO2 97 %.  ECOG PERFORMANCE STATUS: 1  Physical Exam  Constitutional: Oriented to person, place, and time and well-developed, well-nourished, and in no distress. No distress.  HENT:  Head: Normocephalic and atraumatic.  Mouth/Throat: Oropharynx is clear and moist. No oropharyngeal exudate.  Eyes: Conjunctivae are normal. Right eye exhibits no discharge. Left eye exhibits no discharge. No scleral icterus.  Neck: Normal range of motion. Neck supple.  Cardiovascular: Normal rate, regular rhythm, normal heart sounds and intact distal pulses.   Pulmonary/Chest: Effort normal. Quiet breath sounds bilaterally.  No respiratory distress. No wheezes. No rales.   Abdominal: Soft. Bowel sounds are normal. Exhibits no distension and no mass. There is no tenderness.  Musculoskeletal: Normal range of motion. Exhibits no edema.  Lymphadenopathy:    No cervical adenopathy.  Neurological: Alert and oriented to person, place, and time. Exhibits normal muscle tone. Gait normal. Coordination normal.  Skin: Skin is warm and dry. No rash noted. Not diaphoretic. No erythema. No pallor.  Psychiatric: Mood, memory and judgment normal.  Vitals reviewed.  LABORATORY DATA: Lab Results  Component Value Date   WBC 4.8 07/20/2022   HGB 11.2 (L) 07/20/2022   HCT 33.1 (L) 07/20/2022   MCV 89.9 07/20/2022   PLT 257 07/20/2022      Chemistry      Component Value Date/Time   NA 139 07/20/2022 0816   K 3.7 07/20/2022 0816   CL 98 07/20/2022 0816   CO2 33 (H) 07/20/2022 0816   BUN 10 07/20/2022 0816   CREATININE 0.85 07/20/2022 0816      Component Value Date/Time   CALCIUM 9.4 07/20/2022 0816    ALKPHOS 79 07/20/2022 0816   AST 23 07/20/2022 0816   ALT 31 07/20/2022 0816   BILITOT 0.5 07/20/2022 0816       RADIOGRAPHIC STUDIES:  CT Chest W Contrast  Result Date: 07/20/2022 CLINICAL DATA:  Restaging non-small cell lung cancer. * Tracking Code: BO * EXAM: CT CHEST WITH CONTRAST TECHNIQUE: Multidetector CT imaging of the chest was performed during intravenous contrast administration. RADIATION DOSE REDUCTION: This exam was performed according to the departmental dose-optimization program which includes automated exposure control, adjustment of the mA and/or kV according to patient size and/or use of iterative reconstruction technique. CONTRAST:  70m OMNIPAQUE IOHEXOL 300 MG/ML  SOLN COMPARISON:  Numerous prior imaging studies. The most recent CT scan is 12/13/2021 and the most recent PET-CT is 04/20/2022 FINDINGS: Cardiovascular: The heart is normal in size. No pericardial effusion.  The aorta is normal in caliber. No dissection. The branch vessels are patent. No definite coronary artery calcifications. Mediastinum/Nodes: No mediastinal or hilar mass or lymphadenopathy. Stable small subcarinal lymph measuring 7 mm. The esophagus is grossly normal. Lungs/Pleura: The hypermetabolic right upper lobe/suprahilar masses has decreased in size since the PET-CT. On that study it measured approximately 6.1 x 5.2 cm and now measures 5.2 x 3.5 cm. 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 No new pulmonary nodules to suggest pulmonary metastatic disease. Small right pleural effusion with minimal overlying atelectasis. Stable underlying emphysematous changes and pulmonary scarring. Upper Abdomen: Stable advanced hepatic steatosis. No hepatic or adrenal gland lesions are identified. No upper abdominal lymphadenopathy. Musculoskeletal: No chest wall mass, supraclavicular or axillary adenopathy. Small scattered lymph nodes are stable. No significant bony  findings. Stable sclerotic changes associated with thoracic and lumbar spine Schmorl's nodes. IMPRESSION: 1. Interval decrease in size of the right upper lobe/suprahilar mass consistent with a positive response to treatment. 2. 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 3. No new pulmonary nodules to suggest pulmonary metastatic disease. 4. Small right pleural effusion with minimal overlying atelectasis. 5. Stable emphysematous changes and pulmonary scarring. 6. Stable advanced hepatic steatosis. Emphysema (ICD10-J43.9). Electronically Signed   By: Marijo Sanes M.D.   On: 07/20/2022 09:56     ASSESSMENT/PLAN:  This is a very pleasant 62 year old Caucasian male with recurrent lung cancer, initially diagnosed with stage IIIb (T3, N2, M0) non-small cell lung cancer, squamous cell carcinoma.  The patient presented with a large right upper lobe perihilar mass with right hilar and subcarinal lymphadenopathy.  The patient was diagnosed in April 2022.   The patient completed a course of concurrent chemoradiation with weekly carboplatin and paclitaxel.  The patient is currently status post 8 cycles. His last dose was on 11/10/20.  He then was on immunotherapy with Imfinzi 1500 mg IV every 4 weeks.  He is status post 3 cycles.  It was discontinued secondary to concerns for pneumonitis.   He had been on observation until his PCP at [facility] ordered a CT scan which showed consolidation in the right hilum concerning for a lung mass.  Dr. Valeta Harms arrange for a bronchoscopy on 04/05/2022 and the final pathology was consistent with squamous cell carcinoma. The patient also had a PET scan and MRI performed recently. His scan showed worsening and progressive hypermetabolic activity in the right upper lobe/hilar mass concerning for progressive disease locally.   He was seen by Dr. Sondra Come for consideration of palliative radiotherapy to the recurrent disease in the right lung but Dr.  Sondra Come did not feel that he can offer the patient any additional radiation because of the high-dose he received in the past. The patient started systemic chemotherapy with carboplatin for AUC of 5 and paclitaxel 175 Mg/M2 IV every 3 weeks with Neulasta support last week.  Status post 3 cycles.  The patient has significant reaction to carboplatin with cycle #3 with diaphoresis, color change, shortness of breath, and tachycardia.  Carboplatin was discontinued from there from his care plan.   The patient was seen with Dr. Julien Nordmann today.  The patient recently had a restaging CT scan performed.  Dr. Julien Nordmann personally and independently reviewed the scan and discussed the results with the patient today.  The scan showed an oval decrease in the size of the right upper lobe, suprahilar mass.    Dr. Julien Nordmann recommends that he receive 6 cycles of treatment total.  He will proceed with cycle #4 today with Taxol 175 mg/m as opposed to 200 mg/m2.   We will see the patient back for follow-up visit in 3 weeks for evaluation before starting cycle #5.   Will continue taking gabapentin for his peripheral neuropathy.  He is scheduled to see palliative care in the infusion room today.  He will continue taking Xarelto for his history of pulmonary embolism  The patient was advised to call immediately if he has any concerning symptoms in the interval. The patient voices understanding of current disease status and treatment options and is in agreement with the current care plan. All questions were answered. The patient knows to call the clinic with any problems, questions or concerns. We can certainly see the patient much sooner if necessary   No orders of the defined types were placed in this encounter.    Paul Mclean L Natan Hartog, PA-C 07/20/22  ADDENDUM: Hematology/Oncology Attending: I had a face encounter with the patient today.  I reviewed his record, lab, scan and recommended his care plan.  This is a  very pleasant 62 years old white male with recurrent non-small cell lung cancer that was initially diagnosed in April 2022 with evidence for disease recurrence in November 2023. The patient status post a course of concurrent chemoradiation with weekly carboplatin and paclitaxel followed by 3 cycles of consolidation treatment with immunotherapy that was discontinued secondary to suspicious immunotherapy mediated pneumonitis. He started systemic chemotherapy for the disease recurrence with carboplatin for AUC of 5 and paclitaxel 175 Mg/M2 with Neulasta support status post 3 cycles.  He had hypersensitivity reaction to the carboplatin during cycle #3 and this was discontinued. He had repeat CT scan of the chest from yesterday.  I personally and independently reviewed the scan images and discussed the results with the patient and his daughter. His scan showed improvement of his disease with interval decrease in the size of the right upper lobe/suprahilar mass consistent with positive response to the treatment. I recommended for the patient to continue his current treatment but with only single agent paclitaxel 175 Mg/M2 for 3 more cycles. He will proceed with cycle #4 today as planned. I will see him back for follow-up visit in 3 weeks for evaluation before the next cycle of his treatment. The patient was advised to call immediately if he has any other concerning symptoms in the interval. The total time spent in the appointment was 30 minutes. Disclaimer: This note was dictated with voice recognition software. Similar sounding words can inadvertently be transcribed and may be missed upon review. Eilleen Kempf, MD

## 2022-07-18 NOTE — Progress Notes (Unsigned)
Paul Mclean  Telephone:(336) [phone] Fax:(336) [phone]   Name: Paul Mclean Date: 07/18/2022 MRN: [id number]  DOB: Mar 28, 1961  Patient Care Team: [name], MD as PCP - General (Internal Medicine) Pickenpack-Cousar, Carlena Sax, NP as Nurse Practitioner (Nurse Practitioner)    INTERVAL HISTORY: Paul Mclean is a 62 y.o. 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  Palliative ask to see for pain management and goals of care.  SOCIAL HISTORY:     reports that he quit smoking about 16 years ago. His smoking use included cigarettes. He has a 60.00 pack-year smoking history. He has never used smokeless tobacco. He reports current alcohol use of about 1.0 - 2.0 standard drink of alcohol per week. He reports that he does not use drugs.  ADVANCE DIRECTIVES:  Advanced directives on file  CODE STATUS: Full code  PAST MEDICAL HISTORY: Past Medical History:  Diagnosis Date   Atrial flutter ([place])    question of brief aflutter 02/28/22 in setting of acute pericarditis (not captured on ECG)   Cancer ([place])    right lung cancer   DVT (deep venous thrombosis) ([place]) 02/27/2021   LLE partially occlusive DVT 02/25/21, started on Xarelto   Dyspnea    due to cancer per pt   History of radiation therapy 11/12/2020   right lung  09/30/2020-11/12/2020  Dr Gery Pray   Hypothyroidism    Thyroid disease     ALLERGIES:  is allergic to carboplatin.  MEDICATIONS:  Current Outpatient Medications  Medication Sig Dispense Refill   albuterol (VENTOLIN HFA) 108 (90 Base) MCG/ACT inhaler Inhale 2 puffs into the lungs every 6 (six) hours as needed for wheezing or shortness of breath. 8 g 2   Budeson-Glycopyrrol-Formoterol (BREZTRI AEROSPHERE) 160-9-4.8 MCG/ACT AERO Inhale 2 puffs into the lungs in the morning and at bedtime. 10.7 g 11   furosemide (LASIX) 20  MG tablet TAKE 1 TABLET BY MOUTH DAILY AS NEEDED FOR SWELLING 20 tablet 0   gabapentin (NEURONTIN) 300 MG capsule Take 1 capsule in the morning and 2 capsules at bedtime. 90 capsule 1   ibuprofen (ADVIL) 200 MG tablet Take 200 mg by mouth in the morning and at bedtime.     ipratropium-albuterol (DUONEB) 0.5-2.5 (3) MG/3ML SOLN Inhale 3 mLs into the lungs every 6 (six) hours as needed (wheezing/shortness of breath).     levothyroxine (SYNTHROID) 112 MCG tablet Take 112 mcg by mouth daily before breakfast.     morphine (MS CONTIN) 30 MG 12 hr tablet Take 1 tablet (30 mg total) by mouth every 8 (eight) hours. 90 tablet 0   morphine (MSIR) 15 MG tablet Take 1 tablet (15 mg total) by mouth every 4 (four) hours as needed for moderate pain or severe pain (pain.). 90 tablet 0   ondansetron (ZOFRAN) 8 MG tablet Take 1 tablet (8 mg total) by mouth every 8 (eight) hours as needed for nausea or vomiting. May take starting on day 3 following chemotherapy 30 tablet 1   pantoprazole (PROTONIX) 40 MG tablet Take 1 tablet (40 mg total) by mouth daily. 30 tablet 2   polyethylene glycol powder (MIRALAX) 17 GM/SCOOP powder Take 1 scoop (17GM) once daily. 255 g 0   potassium chloride (KLOR-CON M) 10 MEQ tablet Take 1 tablet (10 mEq total) by mouth daily. 5 tablet 0   predniSONE (DELTASONE) 10  MG tablet Take 5 mg by mouth in the morning.     XARELTO 20 MG TABS tablet TAKE 1 TABLET(20 MG) BY MOUTH DAILY WITH SUPPER (Patient taking differently: Take 20 mg by mouth in the morning.) 30 tablet 3   No current facility-administered medications for this visit.    VITAL SIGNS: There were no vitals taken for this visit. There were no vitals filed for this visit.  Estimated body mass index is 31.28 kg/m as calculated from the following:   Height as of 06/08/22: '5\' 9"'$  (1.753 m).   Weight as of 06/29/22: 211 lb 12.8 oz (96.1 kg).   PERFORMANCE STATUS (ECOG) : 1 - Symptomatic but completely ambulatory   Physical  Exam General: NAD Cardiovascular: regular rate and rhythm Pulmonary: normal breathing pattern  Abdomen: soft, nontender, + bowel sounds Extremities: no edema, no joint deformities Skin: no rashes Neurological: AAO x3   IMPRESSION: [name] seen in infusion area. Daughter present for visit. [name] is alert, oriented, and pleasant. Eating subway. No acute distress. Denies nausea, vomiting, constipation, or diarrhea.   Neoplasm related pain Mr. Soter expresses appreciation of pain being well controlled on current regimen. He reports taking MS Contin 30 mg q 8 hr as scheduled. He is only taking his breakthrough MSIR 15 mg 1-2 x per week. Daily low-dose Prednisone. Asks if he can increase dose and is educated regarding balancing the benefits with potential harm of higher doses of steroids. Patient verbalizes understanding that not advised at this time in the setting of active chemotherapy.   Patient continues to experience intermittent tingling and coldness of his hands and feet. Reports that these neuropathic pains are worse at night and keep him awake at times. He is taking Gabapentin 300 mg in the morning and 600 mg at hs. Education provided to increase night time gabapentin dose to '600mg'$  twice daily. The bedtime dose was increased at previous visit and has yielded some benefit. Has tried Melatonin and reports it only helps for a night or so and then loses its efficacy. Reports that he has tried Benadryl and it is effective.     We will continue to closely monitor and provide support.    Decreased appetite/weight loss [name] reports a good appetite. Nausea and constipation well managed. Documented weight 205 lbs (1/31), 211 lbs (2/21), 204 lbs (3/13). Question scale readings/amount of clothing worn. Will continue to monitor given the variation in readings.   We discussed the importance of continued conversation with family and their medical providers regarding overall plan of care and  treatment options, ensuring decisions are within the context of the patients values and GOCs.  PLAN:  MS Contin 30 mg three times daily MS IR '15mg'$  every 4-6 hours as needed for breakthrough pain. Not requiring daily. Gabapentin '600mg'$  daily and '600mg'$  at bedtime. Miralax daily for bowel regimen. We will continue to closely monitor and assist with symptom management.  Palliative team will plan to see patient back in 3-4 weeks in collaboration to other oncology appointments. Patient and family knows to contact office if needed sooner.    Patient expressed understanding and was in agreement with this plan. He also understands that He can call the clinic at any time with any questions, concerns, or complaints.      Any controlled substances utilized were prescribed in the context of palliative care. PDMP has been reviewed.    Signed by: Moss Mc, RN MSN [facility] / NP Student  Time Total: 30 min  I assessed patient with Levada Dy, NP Student. Agree with above findings.   Visit consisted of counseling and education dealing with the complex and emotionally intense issues of symptom management and palliative care in the setting of serious and potentially life-threatening illness.Greater than 50%  of this time was spent counseling and coordinating care related to the above assessment and plan.  Alda Lea, AGPCNP-BC  Palliative Medicine Team/[place]

## 2022-07-19 ENCOUNTER — Encounter (HOSPITAL_COMMUNITY): Payer: Self-pay

## 2022-07-19 ENCOUNTER — Ambulatory Visit (HOSPITAL_COMMUNITY)
Admission: RE | Admit: 2022-07-19 | Discharge: 2022-07-19 | Disposition: A | Discharge status: Home / Self Care | Payer: Managed Care, Other (non HMO) | Source: Ambulatory Visit | Attending: Internal Medicine | Admitting: Internal Medicine

## 2022-07-19 DIAGNOSIS — C3490 Malignant neoplasm of unspecified part of unspecified bronchus or lung: Secondary | ICD-10-CM | POA: Diagnosis present

## 2022-07-19 MED ORDER — IOHEXOL 300 MG/ML  SOLN
75.0000 mL | Freq: Once | INTRAMUSCULAR | Status: AC | PRN
  Administered 2022-07-19: 75 mL via INTRAVENOUS

## 2022-07-19 MED ORDER — SODIUM CHLORIDE (PF) 0.9 % IJ SOLN
INTRAMUSCULAR | Status: AC
  Filled 2022-07-19: qty 50

## 2022-07-19 MED FILL — Fosaprepitant Dimeglumine For IV Infusion 150 MG (Base Eq): INTRAVENOUS | Qty: 5 | Status: AC

## 2022-07-19 MED FILL — Dexamethasone Sodium Phosphate Inj 100 MG/10ML: INTRAMUSCULAR | Qty: 1 | Status: AC

## 2022-07-20 ENCOUNTER — Inpatient Hospital Stay: Payer: Managed Care, Other (non HMO)

## 2022-07-20 ENCOUNTER — Inpatient Hospital Stay: Payer: Managed Care, Other (non HMO) | Admitting: Physician Assistant

## 2022-07-20 ENCOUNTER — Inpatient Hospital Stay: Payer: Managed Care, Other (non HMO) | Attending: Physician Assistant

## 2022-07-20 ENCOUNTER — Other Ambulatory Visit: Payer: Self-pay

## 2022-07-20 ENCOUNTER — Encounter: Payer: Self-pay | Admitting: Nurse Practitioner

## 2022-07-20 ENCOUNTER — Inpatient Hospital Stay (HOSPITAL_BASED_OUTPATIENT_CLINIC_OR_DEPARTMENT_OTHER): Payer: Managed Care, Other (non HMO) | Admitting: Nurse Practitioner

## 2022-07-20 VITALS — BP 148/97 | HR 98 | Temp 98.1°F | Resp 20 | Wt 204.9 lb

## 2022-07-20 VITALS — BP 128/68 | HR 99 | Resp 18

## 2022-07-20 DIAGNOSIS — C3491 Malignant neoplasm of unspecified part of right bronchus or lung: Secondary | ICD-10-CM | POA: Diagnosis not present

## 2022-07-20 DIAGNOSIS — Z86711 Personal history of pulmonary embolism: Secondary | ICD-10-CM | POA: Diagnosis not present

## 2022-07-20 DIAGNOSIS — R634 Abnormal weight loss: Secondary | ICD-10-CM | POA: Diagnosis not present

## 2022-07-20 DIAGNOSIS — M792 Neuralgia and neuritis, unspecified: Secondary | ICD-10-CM

## 2022-07-20 DIAGNOSIS — J90 Pleural effusion, not elsewhere classified: Secondary | ICD-10-CM | POA: Diagnosis not present

## 2022-07-20 DIAGNOSIS — G893 Neoplasm related pain (acute) (chronic): Secondary | ICD-10-CM | POA: Insufficient documentation

## 2022-07-20 DIAGNOSIS — R630 Anorexia: Secondary | ICD-10-CM | POA: Diagnosis not present

## 2022-07-20 DIAGNOSIS — Z515 Encounter for palliative care: Secondary | ICD-10-CM | POA: Diagnosis not present

## 2022-07-20 DIAGNOSIS — Z87891 Personal history of nicotine dependence: Secondary | ICD-10-CM | POA: Insufficient documentation

## 2022-07-20 DIAGNOSIS — G4709 Other insomnia: Secondary | ICD-10-CM | POA: Diagnosis not present

## 2022-07-20 DIAGNOSIS — R0609 Other forms of dyspnea: Secondary | ICD-10-CM | POA: Insufficient documentation

## 2022-07-20 DIAGNOSIS — Z79899 Other long term (current) drug therapy: Secondary | ICD-10-CM | POA: Diagnosis not present

## 2022-07-20 DIAGNOSIS — Z7901 Long term (current) use of anticoagulants: Secondary | ICD-10-CM | POA: Insufficient documentation

## 2022-07-20 DIAGNOSIS — J432 Centrilobular emphysema: Secondary | ICD-10-CM | POA: Insufficient documentation

## 2022-07-20 DIAGNOSIS — Z79891 Long term (current) use of opiate analgesic: Secondary | ICD-10-CM | POA: Insufficient documentation

## 2022-07-20 DIAGNOSIS — E039 Hypothyroidism, unspecified: Secondary | ICD-10-CM | POA: Diagnosis not present

## 2022-07-20 DIAGNOSIS — Z5111 Encounter for antineoplastic chemotherapy: Secondary | ICD-10-CM

## 2022-07-20 DIAGNOSIS — Z9221 Personal history of antineoplastic chemotherapy: Secondary | ICD-10-CM | POA: Diagnosis not present

## 2022-07-20 DIAGNOSIS — Z923 Personal history of irradiation: Secondary | ICD-10-CM | POA: Insufficient documentation

## 2022-07-20 DIAGNOSIS — G629 Polyneuropathy, unspecified: Secondary | ICD-10-CM | POA: Diagnosis not present

## 2022-07-20 DIAGNOSIS — Z86718 Personal history of other venous thrombosis and embolism: Secondary | ICD-10-CM | POA: Insufficient documentation

## 2022-07-20 DIAGNOSIS — C3431 Malignant neoplasm of lower lobe, right bronchus or lung: Secondary | ICD-10-CM | POA: Insufficient documentation

## 2022-07-20 LAB — CMP (CANCER CENTER ONLY)
ALT: 31 U/L (ref 0–44)
AST: 23 U/L (ref 15–41)
Albumin: 3.6 g/dL (ref 3.5–5.0)
Alkaline Phosphatase: 79 U/L (ref 38–126)
Anion gap: 8 (ref 5–15)
BUN: 10 mg/dL (ref 6–20)
CO2: 33 mmol/L — ABNORMAL HIGH (ref 22–32)
Calcium: 9.4 mg/dL (ref 8.9–10.3)
Chloride: 98 mmol/L (ref 98–111)
Creatinine: 0.85 mg/dL (ref 0.61–1.24)
GFR, Estimated: >60 mL/min (ref >60)
Glucose, Bld: 99 mg/dL (ref 70–99)
Potassium: 3.7 mmol/L (ref 3.5–5.1)
Sodium: 139 mmol/L (ref 135–145)
Total Bilirubin: 0.5 mg/dL (ref 0.3–1.2)
Total Protein: 7.5 g/dL (ref 6.5–8.1)

## 2022-07-20 LAB — CBC WITH DIFFERENTIAL (CANCER CENTER ONLY)
Abs Immature Granulocytes: 0.05 10*3/uL (ref 0.00–0.07)
Basophils Absolute: 0.1 10*3/uL (ref 0.0–0.1)
Basophils Relative: 1 %
Eosinophils Absolute: 0 10*3/uL (ref 0.0–0.5)
Eosinophils Relative: 1 %
HCT: 33.1 % — ABNORMAL LOW (ref 39.0–52.0)
Hemoglobin: 11.2 g/dL — ABNORMAL LOW (ref 13.0–17.0)
Immature Granulocytes: 1 %
Lymphocytes Relative: 33 %
Lymphs Abs: 1.6 10*3/uL (ref 0.7–4.0)
MCH: 30.4 pg (ref 26.0–34.0)
MCHC: 33.8 g/dL (ref 30.0–36.0)
MCV: 89.9 fL (ref 80.0–100.0)
Monocytes Absolute: 0.7 10*3/uL (ref 0.1–1.0)
Monocytes Relative: 16 %
Neutro Abs: 2.3 10*3/uL (ref 1.7–7.7)
Neutrophils Relative %: 48 %
Platelet Count: 257 10*3/uL (ref 150–400)
RBC: 3.68 MIL/uL — ABNORMAL LOW (ref 4.22–5.81)
RDW: 17.2 % — ABNORMAL HIGH (ref 11.5–15.5)
WBC Count: 4.8 10*3/uL (ref 4.0–10.5)
nRBC: 0 % (ref 0.0–0.2)

## 2022-07-20 MED ORDER — PALONOSETRON HCL INJECTION 0.25 MG/5ML
0.2500 mg | Freq: Once | INTRAVENOUS | Status: AC
  Administered 2022-07-20: 0.25 mg via INTRAVENOUS
  Filled 2022-07-20: qty 5

## 2022-07-20 MED ORDER — DIPHENHYDRAMINE HCL 50 MG/ML IJ SOLN
50.0000 mg | Freq: Once | INTRAMUSCULAR | Status: AC
  Administered 2022-07-20: 50 mg via INTRAVENOUS
  Filled 2022-07-20: qty 1

## 2022-07-20 MED ORDER — SODIUM CHLORIDE 0.9 % IV SOLN
10.0000 mg | Freq: Once | INTRAVENOUS | Status: AC
  Administered 2022-07-20: 10 mg via INTRAVENOUS
  Filled 2022-07-20: qty 10

## 2022-07-20 MED ORDER — FAMOTIDINE IN NACL 20-0.9 MG/50ML-% IV SOLN
20.0000 mg | Freq: Once | INTRAVENOUS | Status: AC
  Administered 2022-07-20: 20 mg via INTRAVENOUS
  Filled 2022-07-20: qty 50

## 2022-07-20 MED ORDER — SODIUM CHLORIDE 0.9 % IV SOLN: Freq: Once | INTRAVENOUS | Status: AC

## 2022-07-20 MED ORDER — SODIUM CHLORIDE 0.9 % IV SOLN
150.0000 mg | Freq: Once | INTRAVENOUS | Status: DC
  Filled 2022-07-20: qty 5

## 2022-07-20 MED ORDER — SODIUM CHLORIDE 0.9 % IV SOLN
175.0000 mg/m2 | Freq: Once | INTRAVENOUS | Status: AC
  Administered 2022-07-20: 378 mg via INTRAVENOUS
  Filled 2022-07-20: qty 63

## 2022-07-20 NOTE — Progress Notes (Addendum)
Taxol diluted in NS530m excel bag, [id number]:[id number] Exp:05/2024.  PRaul DelLSanta [place] BCPS, BCOP 07/20/2022 11:20 AM

## 2022-07-20 NOTE — Patient Instructions (Signed)
[facility]  Discharge Instructions: Thank you for choosing [facility] to provide your oncology and hematology care.   If you have a lab appointment with the Cancer Center, please go directly to the Cancer Center and check in at the registration area.   Wear comfortable clothing and clothing appropriate for easy access to any Portacath or PICC line.   We strive to give you quality time with your provider. You may need to reschedule your appointment if you arrive late (15 or more minutes).  Arriving late affects you and other patients whose appointments are after yours.  Also, if you miss three or more appointments without notifying the office, you may be dismissed from the clinic at the provider's discretion.      For prescription refill requests, have your pharmacy contact our office and allow 72 hours for refills to be completed.    Today you received the following chemotherapy and/or immunotherapy agents Paclitaxel      To help prevent nausea and vomiting after your treatment, we encourage you to take your nausea medication as directed.  BELOW ARE SYMPTOMS THAT SHOULD BE REPORTED IMMEDIATELY: *FEVER GREATER THAN 100.4 F (38 C) OR HIGHER *CHILLS OR SWEATING *NAUSEA AND VOMITING THAT IS NOT CONTROLLED WITH YOUR NAUSEA MEDICATION *UNUSUAL SHORTNESS OF BREATH *UNUSUAL BRUISING OR BLEEDING *URINARY PROBLEMS (pain or burning when urinating, or frequent urination) *BOWEL PROBLEMS (unusual diarrhea, constipation, pain near the anus) TENDERNESS IN MOUTH AND THROAT WITH OR WITHOUT PRESENCE OF ULCERS (sore throat, sores in mouth, or a toothache) UNUSUAL RASH, SWELLING OR PAIN  UNUSUAL VAGINAL DISCHARGE OR ITCHING   Items with * indicate a potential emergency and should be followed up as soon as possible or go to the Emergency Department if any problems should occur.  Please show the CHEMOTHERAPY ALERT CARD or IMMUNOTHERAPY ALERT CARD at  check-in to the Emergency Department and triage nurse.  Should you have questions after your visit or need to cancel or reschedule your appointment, please contact [facility]  Dept: [phone]  and follow the prompts.  Office hours are 8:00 a.m. to 4:30 p.m. Monday - Friday. Please note that voicemails left after 4:00 p.m. may not be returned until the following business day.  We are closed weekends and major holidays. You have access to a nurse at all times for urgent questions. Please call the main number to the clinic Dept: [phone] and follow the prompts.   For any non-urgent questions, you may also contact your provider using MyChart. We now offer e-Visits for anyone 18 and older to request care online for non-urgent symptoms. For details visit [email or web address].   Also download the MyChart app! Go to the app store, search "MyChart", open the app, select [facility], and log in with your MyChart username and password.  Paclitaxel Injection What is this medication? PACLITAXEL (PAK li TAX el) treats some types of cancer. It works by slowing down the growth of cancer cells. This medicine may be used for other purposes; ask your health care provider or pharmacist if you have questions. COMMON BRAND NAME(S): Onxol, Taxol What should I tell my care team before I take this medication? They need to know if you have any of these conditions: Heart disease Liver disease Low white blood cell levels An unusual or allergic reaction to paclitaxel, other medications, foods, dyes, or preservatives If you or your partner are pregnant or trying to get   pregnant Breast-feeding How should I use this medication? This medication is injected into a vein. It is given by your care team in a hospital or clinic setting. Talk to your care team about the use of this medication in children. While it may be given to children for selected conditions, precautions do  apply. Overdosage: If you think you have taken too much of this medicine contact a poison control center or emergency room at once. NOTE: This medicine is only for you. Do not share this medicine with others. What if I miss a dose? Keep appointments for follow-up doses. It is important not to miss your dose. Call your care team if you are unable to keep an appointment. What may interact with this medication? Do not take this medication with any of the following: Live virus vaccines Other medications may affect the way this medication works. Talk with your care team about all of the medications you take. They may suggest changes to your treatment plan to lower the risk of side effects and to make sure your medications work as intended. This list may not describe all possible interactions. Give your health care provider a list of all the medicines, herbs, non-prescription drugs, or dietary supplements you use. Also tell them if you smoke, drink alcohol, or use illegal drugs. Some items may interact with your medicine. What should I watch for while using this medication? Your condition will be monitored carefully while you are receiving this medication. You may need blood work while taking this medication. This medication may make you feel generally unwell. This is not uncommon as chemotherapy can affect healthy cells as well as cancer cells. Report any side effects. Continue your course of treatment even though you feel ill unless your care team tells you to stop. This medication can cause serious allergic reactions. To reduce the risk, your care team may give you other medications to take before receiving this one. Be sure to follow the directions from your care team. This medication may increase your risk of getting an infection. Call your care team for advice if you get a fever, chills, sore throat, or other symptoms of a cold or flu. Do not treat yourself. Try to avoid being around people who are  sick. This medication may increase your risk to bruise or bleed. Call your care team if you notice any unusual bleeding. Be careful brushing or flossing your teeth or using a toothpick because you may get an infection or bleed more easily. If you have any dental work done, tell your dentist you are receiving this medication. Talk to your care team if you may be pregnant. Serious birth defects can occur if you take this medication during pregnancy. Talk to your care team before breastfeeding. Changes to your treatment plan may be needed. What side effects may I notice from receiving this medication? Side effects that you should report to your care team as soon as possible: Allergic reactions--skin rash, itching, hives, swelling of the face, lips, tongue, or throat Heart rhythm changes--fast or irregular heartbeat, dizziness, feeling faint or lightheaded, chest pain, trouble breathing Increase in blood pressure Infection--fever, chills, cough, sore throat, wounds that don't heal, pain or trouble when passing urine, general feeling of discomfort or being unwell Low blood pressure--dizziness, feeling faint or lightheaded, blurry vision Low red blood cell level--unusual weakness or fatigue, dizziness, headache, trouble breathing Painful swelling, warmth, or redness of the skin, blisters or sores at the infusion site Pain, tingling, or numbness in   the hands or feet Slow heartbeat--dizziness, feeling faint or lightheaded, confusion, trouble breathing, unusual weakness or fatigue Unusual bruising or bleeding Side effects that usually do not require medical attention (report to your care team if they continue or are bothersome): Diarrhea Hair loss Joint pain Loss of appetite Muscle pain Nausea Vomiting This list may not describe all possible side effects. Call your doctor for medical advice about side effects. You may report side effects to FDA at 1-800-FDA-1088. Where should I keep my  medication? This medication is given in a hospital or clinic. It will not be stored at home. NOTE: This sheet is a summary. It may not cover all possible information. If you have questions about this medicine, talk to your doctor, pharmacist, or health care provider.  2023 Elsevier/Gold Standard (2021-09-09 00:00:00)  

## 2022-07-22 ENCOUNTER — Other Ambulatory Visit: Payer: Self-pay

## 2022-07-22 ENCOUNTER — Inpatient Hospital Stay: Payer: Managed Care, Other (non HMO)

## 2022-07-22 VITALS — BP 140/98 | HR 109 | Temp 98.1°F | Resp 20

## 2022-07-22 DIAGNOSIS — Z5111 Encounter for antineoplastic chemotherapy: Secondary | ICD-10-CM | POA: Diagnosis not present

## 2022-07-22 DIAGNOSIS — C3491 Malignant neoplasm of unspecified part of right bronchus or lung: Secondary | ICD-10-CM

## 2022-07-22 MED ORDER — PEGFILGRASTIM INJECTION 6 MG/0.6ML ~~LOC~~
6.0000 mg | PREFILLED_SYRINGE | Freq: Once | SUBCUTANEOUS | Status: AC
  Administered 2022-07-22: 6 mg via SUBCUTANEOUS
  Filled 2022-07-22: qty 0.6

## 2022-07-22 NOTE — Patient Instructions (Signed)

## 2022-07-27 ENCOUNTER — Telehealth: Payer: Self-pay

## 2022-07-27 NOTE — Telephone Encounter (Signed)
Pt daughter heather called reporting 9/10 pan for pt in joints. Pain started after neulasta injections and is getting worse. Pt reports taking Claritin and arthritis tylenol and one dose of MIR over night. RN educated that MSIR can be taken up to every 4 hours as needed for pain, to increase frequency and to continue with tylenol and Claritin. Heather verbalized understanding, no further needs at this time.

## 2022-08-02 ENCOUNTER — Other Ambulatory Visit: Payer: Self-pay | Admitting: Internal Medicine

## 2022-08-02 DIAGNOSIS — K5903 Drug induced constipation: Secondary | ICD-10-CM

## 2022-08-02 DIAGNOSIS — F419 Anxiety disorder, unspecified: Secondary | ICD-10-CM

## 2022-08-02 DIAGNOSIS — Z515 Encounter for palliative care: Secondary | ICD-10-CM

## 2022-08-02 DIAGNOSIS — G893 Neoplasm related pain (acute) (chronic): Secondary | ICD-10-CM

## 2022-08-02 DIAGNOSIS — C3491 Malignant neoplasm of unspecified part of right bronchus or lung: Secondary | ICD-10-CM

## 2022-08-02 DIAGNOSIS — M792 Neuralgia and neuritis, unspecified: Secondary | ICD-10-CM

## 2022-08-02 MED ORDER — MORPHINE SULFATE ER 30 MG PO TBCR: 30.0000 mg | EXTENDED_RELEASE_TABLET | Freq: Three times a day (TID) | ORAL | 0 refills | Status: DC

## 2022-08-02 MED ORDER — GABAPENTIN 300 MG PO CAPS: ORAL_CAPSULE | ORAL | 0 refills | Status: DC

## 2022-08-02 MED ORDER — MORPHINE SULFATE 15 MG PO TABS: 15.0000 mg | ORAL_TABLET | ORAL | 0 refills | Status: DC | PRN

## 2022-08-02 MED ORDER — DIAZEPAM 2 MG PO TABS: 2.0000 mg | ORAL_TABLET | Freq: Every day | ORAL | 0 refills | Status: DC

## 2022-08-02 MED ORDER — POLYETHYLENE GLYCOL 3350 17 GM/SCOOP PO POWD: ORAL | 0 refills | Status: DC

## 2022-08-02 NOTE — Telephone Encounter (Signed)
Pt daughter called and reports pt was having panic attacks at night in relation to his breathing.pt is using O2, nebulizes, and inhalers for SOB, per Lexine Baton, NP pt to take valium before bed as needed for anxiety. Pt daughter informed and educated on medication and side effects, verbalized understanding, no further questions at this time.

## 2022-08-09 MED FILL — Dexamethasone Sodium Phosphate Inj 100 MG/10ML: INTRAMUSCULAR | Qty: 1 | Status: AC

## 2022-08-10 ENCOUNTER — Encounter: Payer: Self-pay | Admitting: Internal Medicine

## 2022-08-10 ENCOUNTER — Inpatient Hospital Stay: Payer: Managed Care, Other (non HMO) | Attending: Physician Assistant

## 2022-08-10 ENCOUNTER — Encounter: Payer: Self-pay | Admitting: Medical Oncology

## 2022-08-10 ENCOUNTER — Inpatient Hospital Stay: Payer: Managed Care, Other (non HMO) | Admitting: Internal Medicine

## 2022-08-10 ENCOUNTER — Inpatient Hospital Stay (HOSPITAL_BASED_OUTPATIENT_CLINIC_OR_DEPARTMENT_OTHER): Payer: Managed Care, Other (non HMO) | Admitting: Nurse Practitioner

## 2022-08-10 ENCOUNTER — Inpatient Hospital Stay: Payer: Managed Care, Other (non HMO)

## 2022-08-10 ENCOUNTER — Encounter: Payer: Self-pay | Admitting: Nurse Practitioner

## 2022-08-10 ENCOUNTER — Other Ambulatory Visit: Payer: Self-pay

## 2022-08-10 VITALS — BP 126/78 | HR 102 | Resp 18

## 2022-08-10 DIAGNOSIS — Z79899 Other long term (current) drug therapy: Secondary | ICD-10-CM | POA: Insufficient documentation

## 2022-08-10 DIAGNOSIS — J90 Pleural effusion, not elsewhere classified: Secondary | ICD-10-CM | POA: Diagnosis not present

## 2022-08-10 DIAGNOSIS — R630 Anorexia: Secondary | ICD-10-CM | POA: Insufficient documentation

## 2022-08-10 DIAGNOSIS — Z923 Personal history of irradiation: Secondary | ICD-10-CM | POA: Insufficient documentation

## 2022-08-10 DIAGNOSIS — Z86711 Personal history of pulmonary embolism: Secondary | ICD-10-CM | POA: Diagnosis not present

## 2022-08-10 DIAGNOSIS — J432 Centrilobular emphysema: Secondary | ICD-10-CM | POA: Insufficient documentation

## 2022-08-10 DIAGNOSIS — Z87891 Personal history of nicotine dependence: Secondary | ICD-10-CM | POA: Insufficient documentation

## 2022-08-10 DIAGNOSIS — Z515 Encounter for palliative care: Secondary | ICD-10-CM

## 2022-08-10 DIAGNOSIS — G893 Neoplasm related pain (acute) (chronic): Secondary | ICD-10-CM | POA: Diagnosis not present

## 2022-08-10 DIAGNOSIS — Z5111 Encounter for antineoplastic chemotherapy: Secondary | ICD-10-CM | POA: Insufficient documentation

## 2022-08-10 DIAGNOSIS — R634 Abnormal weight loss: Secondary | ICD-10-CM | POA: Insufficient documentation

## 2022-08-10 DIAGNOSIS — E039 Hypothyroidism, unspecified: Secondary | ICD-10-CM | POA: Insufficient documentation

## 2022-08-10 DIAGNOSIS — Z7952 Long term (current) use of systemic steroids: Secondary | ICD-10-CM | POA: Diagnosis not present

## 2022-08-10 DIAGNOSIS — G629 Polyneuropathy, unspecified: Secondary | ICD-10-CM | POA: Diagnosis not present

## 2022-08-10 DIAGNOSIS — Z79891 Long term (current) use of opiate analgesic: Secondary | ICD-10-CM | POA: Diagnosis not present

## 2022-08-10 DIAGNOSIS — C3431 Malignant neoplasm of lower lobe, right bronchus or lung: Secondary | ICD-10-CM | POA: Diagnosis present

## 2022-08-10 DIAGNOSIS — Z86718 Personal history of other venous thrombosis and embolism: Secondary | ICD-10-CM | POA: Insufficient documentation

## 2022-08-10 DIAGNOSIS — Z7901 Long term (current) use of anticoagulants: Secondary | ICD-10-CM | POA: Insufficient documentation

## 2022-08-10 DIAGNOSIS — Z5189 Encounter for other specified aftercare: Secondary | ICD-10-CM | POA: Insufficient documentation

## 2022-08-10 DIAGNOSIS — Z9221 Personal history of antineoplastic chemotherapy: Secondary | ICD-10-CM | POA: Insufficient documentation

## 2022-08-10 DIAGNOSIS — R5383 Other fatigue: Secondary | ICD-10-CM | POA: Diagnosis not present

## 2022-08-10 DIAGNOSIS — C3491 Malignant neoplasm of unspecified part of right bronchus or lung: Secondary | ICD-10-CM

## 2022-08-10 DIAGNOSIS — Z9049 Acquired absence of other specified parts of digestive tract: Secondary | ICD-10-CM | POA: Diagnosis not present

## 2022-08-10 DIAGNOSIS — F419 Anxiety disorder, unspecified: Secondary | ICD-10-CM | POA: Insufficient documentation

## 2022-08-10 DIAGNOSIS — G35 Multiple sclerosis: Secondary | ICD-10-CM | POA: Diagnosis not present

## 2022-08-10 DIAGNOSIS — R1013 Epigastric pain: Secondary | ICD-10-CM

## 2022-08-10 DIAGNOSIS — M792 Neuralgia and neuritis, unspecified: Secondary | ICD-10-CM

## 2022-08-10 LAB — CMP (CANCER CENTER ONLY)
ALT: 29 U/L (ref 0–44)
AST: 21 U/L (ref 15–41)
Albumin: 3.8 g/dL (ref 3.5–5.0)
Alkaline Phosphatase: 72 U/L (ref 38–126)
Anion gap: 9 (ref 5–15)
BUN: 11 mg/dL (ref 6–20)
CO2: 30 mmol/L (ref 22–32)
Calcium: 9.4 mg/dL (ref 8.9–10.3)
Chloride: 100 mmol/L (ref 98–111)
Creatinine: 0.81 mg/dL (ref 0.61–1.24)
GFR, Estimated: >60 mL/min (ref >60)
Glucose, Bld: 102 mg/dL — ABNORMAL HIGH (ref 70–99)
Potassium: 3.4 mmol/L — ABNORMAL LOW (ref 3.5–5.1)
Sodium: 139 mmol/L (ref 135–145)
Total Bilirubin: 0.5 mg/dL (ref 0.3–1.2)
Total Protein: 7.6 g/dL (ref 6.5–8.1)

## 2022-08-10 LAB — CBC WITH DIFFERENTIAL (CANCER CENTER ONLY)
Abs Immature Granulocytes: 0.04 10*3/uL (ref 0.00–0.07)
Basophils Absolute: 0 10*3/uL (ref 0.0–0.1)
Basophils Relative: 1 %
Eosinophils Absolute: 0.1 10*3/uL (ref 0.0–0.5)
Eosinophils Relative: 1 %
HCT: 30.8 % — ABNORMAL LOW (ref 39.0–52.0)
Hemoglobin: 10.4 g/dL — ABNORMAL LOW (ref 13.0–17.0)
Immature Granulocytes: 1 %
Lymphocytes Relative: 16 %
Lymphs Abs: 1 10*3/uL (ref 0.7–4.0)
MCH: 31.7 pg (ref 26.0–34.0)
MCHC: 33.8 g/dL (ref 30.0–36.0)
MCV: 93.9 fL (ref 80.0–100.0)
Monocytes Absolute: 0.8 10*3/uL (ref 0.1–1.0)
Monocytes Relative: 13 %
Neutro Abs: 4.1 10*3/uL (ref 1.7–7.7)
Neutrophils Relative %: 68 %
Platelet Count: 242 10*3/uL (ref 150–400)
RBC: 3.28 MIL/uL — ABNORMAL LOW (ref 4.22–5.81)
RDW: 18.6 % — ABNORMAL HIGH (ref 11.5–15.5)
WBC Count: 6 10*3/uL (ref 4.0–10.5)
nRBC: 0 % (ref 0.0–0.2)

## 2022-08-10 MED ORDER — SODIUM CHLORIDE 0.9 % IV SOLN
10.0000 mg | Freq: Once | INTRAVENOUS | Status: AC
  Administered 2022-08-10: 10 mg via INTRAVENOUS
  Filled 2022-08-10: qty 10

## 2022-08-10 MED ORDER — FUROSEMIDE 20 MG PO TABS
ORAL_TABLET | ORAL | 0 refills | Status: DC
Start: 2022-08-10 — End: 2022-09-05

## 2022-08-10 MED ORDER — SODIUM CHLORIDE 0.9 % IV SOLN
175.0000 mg/m2 | Freq: Once | INTRAVENOUS | Status: AC
  Administered 2022-08-10: 378 mg via INTRAVENOUS
  Filled 2022-08-10: qty 63

## 2022-08-10 MED ORDER — PANTOPRAZOLE SODIUM 40 MG PO TBEC
40.0000 mg | DELAYED_RELEASE_TABLET | Freq: Every day | ORAL | 2 refills | Status: DC
Start: 2022-08-10 — End: 2023-01-20

## 2022-08-10 MED ORDER — PALONOSETRON HCL INJECTION 0.25 MG/5ML
0.2500 mg | Freq: Once | INTRAVENOUS | Status: AC
  Administered 2022-08-10: 0.25 mg via INTRAVENOUS
  Filled 2022-08-10: qty 5

## 2022-08-10 MED ORDER — SODIUM CHLORIDE 0.9 % IV SOLN: Freq: Once | INTRAVENOUS | Status: AC

## 2022-08-10 MED ORDER — DIAZEPAM 2 MG PO TABS
2.0000 mg | ORAL_TABLET | Freq: Every day | ORAL | 0 refills | Status: DC
Start: 2022-08-10 — End: 2022-08-31

## 2022-08-10 MED ORDER — FAMOTIDINE IN NACL 20-0.9 MG/50ML-% IV SOLN
20.0000 mg | Freq: Once | INTRAVENOUS | Status: AC
  Administered 2022-08-10: 20 mg via INTRAVENOUS
  Filled 2022-08-10: qty 50

## 2022-08-10 MED ORDER — DIPHENHYDRAMINE HCL 50 MG/ML IJ SOLN
50.0000 mg | Freq: Once | INTRAMUSCULAR | Status: AC
  Administered 2022-08-10: 50 mg via INTRAVENOUS
  Filled 2022-08-10: qty 1

## 2022-08-10 NOTE — Progress Notes (Signed)
[place]  Telephone:(336) [phone] Fax:(336) [phone]   Name: Paul Mclean Date: 08/10/2022 MRN: [id number]  DOB: 02/19/1961  Patient Care Team: [name], MD as PCP - General (Internal Medicine) Pickenpack-Cousar, Carlena Sax, NP as Nurse Practitioner (Nurse Practitioner)    INTERVAL HISTORY: Paul Mclean is a 62 y.o. 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  Palliative ask to see for pain management and goals of care.  SOCIAL HISTORY:     reports that he quit smoking about 16 years ago. His smoking use included cigarettes. He has a 60.00 pack-year smoking history. He has never used smokeless tobacco. He reports current alcohol use of about 1.0 - 2.0 standard drink of alcohol per week. He reports that he does not use drugs.  ADVANCE DIRECTIVES:  Advanced directives on file  CODE STATUS: Full code  PAST MEDICAL HISTORY: Past Medical History:  Diagnosis Date   Atrial flutter    question of brief aflutter 02/28/22 in setting of acute pericarditis (not captured on ECG)   Cancer    right lung cancer   DVT (deep venous thrombosis) 02/27/2021   LLE partially occlusive DVT 02/25/21, started on Xarelto   Dyspnea    due to cancer per pt   History of radiation therapy 11/12/2020   right lung  09/30/2020-11/12/2020  Dr Gery Pray   Hypothyroidism    Thyroid disease     ALLERGIES:  is allergic to carboplatin.  MEDICATIONS:  Current Outpatient Medications  Medication Sig Dispense Refill   albuterol (VENTOLIN HFA) 108 (90 Base) MCG/ACT inhaler Inhale 2 puffs into the lungs every 6 (six) hours as needed for wheezing or shortness of breath. 8 g 2   Budeson-Glycopyrrol-Formoterol (BREZTRI AEROSPHERE) 160-9-4.8 MCG/ACT AERO Inhale 2 puffs into the lungs in the morning and at bedtime. 10.7 g 11   diazepam (VALIUM) 2 MG tablet Take 1  tablet (2 mg total) by mouth at bedtime. 15 tablet 0   furosemide (LASIX) 20 MG tablet TAKE 1 TABLET BY MOUTH DAILY AS NEEDED FOR SWELLING 20 tablet 0   gabapentin (NEURONTIN) 300 MG capsule Take 1 capsule in the morning and 3 capsules at bedtime. 120 capsule 0   ibuprofen (ADVIL) 200 MG tablet Take 200 mg by mouth in the morning and at bedtime.     ipratropium-albuterol (DUONEB) 0.5-2.5 (3) MG/3ML SOLN Inhale 3 mLs into the lungs every 6 (six) hours as needed (wheezing/shortness of breath).     levothyroxine (SYNTHROID) 112 MCG tablet Take 112 mcg by mouth daily before breakfast.     morphine (MS CONTIN) 30 MG 12 hr tablet Take 1 tablet (30 mg total) by mouth every 8 (eight) hours. 90 tablet 0   morphine (MSIR) 15 MG tablet Take 1 tablet (15 mg total) by mouth every 4 (four) hours as needed for moderate pain or severe pain (pain.). 90 tablet 0   ondansetron (ZOFRAN) 8 MG tablet Take 1 tablet (8 mg total) by mouth every 8 (eight) hours as needed for nausea or vomiting. May take starting on day 3 following chemotherapy 30 tablet 1   pantoprazole (PROTONIX) 40 MG tablet Take 1 tablet (40 mg total) by mouth daily. 30 tablet 2   polyethylene glycol powder (MIRALAX) 17 GM/SCOOP powder Take 1 scoop (17GM) once daily. 255 g 0   potassium chloride (KLOR-CON M) 10 MEQ tablet  Take 1 tablet (10 mEq total) by mouth daily. 5 tablet 0   predniSONE (DELTASONE) 10 MG tablet Take 5 mg by mouth in the morning.     XARELTO 20 MG TABS tablet TAKE 1 TABLET(20 MG) BY MOUTH DAILY WITH SUPPER (Patient taking differently: Take 20 mg by mouth in the morning.) 30 tablet 3   No current facility-administered medications for this visit.    VITAL SIGNS: There were no vitals taken for this visit. There were no vitals filed for this visit.  Estimated body mass index is 29.74 kg/m as calculated from the following:   Height as of an earlier encounter on 08/10/22: 5\' 9"  (1.753 m).   Weight as of an earlier encounter on 08/10/22:  201 lb 6.4 oz (91.4 kg).   PERFORMANCE STATUS (ECOG) : 1 - Symptomatic but completely ambulatory   Physical Exam General: NAD Cardiovascular: regular rate and rhythm Pulmonary: normal breathing pattern  Abdomen: soft, nontender, + bowel sounds Extremities: no edema, no joint deformities Skin: no rashes Neurological: AAO x3   IMPRESSION: I saw Mr. Wiltgen during infusion. No acute distress noted. Family present. Overall patient reports doing well. Appetite is improving. He is trying to remain as active as possible. Denies nausea, vomiting, constipation, or diarrhea. Is sleeping somewhat better.   Neoplasm related pain Mr. Sebastiani expresses appreciation of pain being well controlled on current regimen.   We discussed his current regimen: MS Contin every 8 hours, MS IR 15 mg as needed for breakthrough pain (not requiring around the clock), and 300 mg gabapentin daily, 900 mg at bedtime.   Reports pain is severe with injections s/p chemo despite use of Claritin. MS IR provides some relief.   Anxiety is controlled with valium. Tolerating without difficulty. Using only as needed.   We will continue to closely monitor and provide support.    Decreased appetite/weight loss Mr. Behrmann reports appetite is ok. Some days better than others. Nausea and constipation well managed. Current weight 201lbs down from 204lbs. We will continue to closely monitor.   We discussed the importance of continued conversation with family and their medical providers regarding overall plan of care and treatment options, ensuring decisions are within the context of the patients values and GOCs.  PLAN:  MS Contin 30 mg three times daily MS IR 15mg  every 4-6 hours as needed for breakthrough pain. Not requiring daily. Gabapentin 600mg  daily and 900mg  at bedtime. Miralax daily for bowel regimen. We will continue to closely monitor and assist with symptom management.  Palliative team will plan to see patient back  in 3-4 weeks in collaboration to other oncology appointments. Patient and family knows to contact office if needed sooner.    Patient expressed understanding and was in agreement with this plan. He also understands that He can call the clinic at any time with any questions, concerns, or complaints.  Any controlled substances utilized were prescribed in the context of palliative care. PDMP has been reviewed.    Visit consisted of counseling and education dealing with the complex and emotionally intense issues of symptom management and palliative care in the setting of serious and potentially life-threatening illness.Greater than 50%  of this time was spent counseling and coordinating care related to the above assessment and plan.  Alda Lea, AGPCNP-BC  Palliative Medicine Team/[place]  *Please note that this is a verbal dictation therefore any spelling or grammatical errors are due to the "[employer]" system interpretation.

## 2022-08-10 NOTE — Patient Instructions (Signed)
[facility]  Discharge Instructions: Thank you for choosing [facility] to provide your oncology and hematology care.   If you have a lab appointment with the Cancer Center, please go directly to the Cancer Center and check in at the registration area.   Wear comfortable clothing and clothing appropriate for easy access to any Portacath or PICC line.   We strive to give you quality time with your provider. You may need to reschedule your appointment if you arrive late (15 or more minutes).  Arriving late affects you and other patients whose appointments are after yours.  Also, if you miss three or more appointments without notifying the office, you may be dismissed from the clinic at the provider's discretion.      For prescription refill requests, have your pharmacy contact our office and allow 72 hours for refills to be completed.    Today you received the following chemotherapy and/or immunotherapy agents Taxol      To help prevent nausea and vomiting after your treatment, we encourage you to take your nausea medication as directed.  BELOW ARE SYMPTOMS THAT SHOULD BE REPORTED IMMEDIATELY: *FEVER GREATER THAN 100.4 F (38 C) OR HIGHER *CHILLS OR SWEATING *NAUSEA AND VOMITING THAT IS NOT CONTROLLED WITH YOUR NAUSEA MEDICATION *UNUSUAL SHORTNESS OF BREATH *UNUSUAL BRUISING OR BLEEDING *URINARY PROBLEMS (pain or burning when urinating, or frequent urination) *BOWEL PROBLEMS (unusual diarrhea, constipation, pain near the anus) TENDERNESS IN MOUTH AND THROAT WITH OR WITHOUT PRESENCE OF ULCERS (sore throat, sores in mouth, or a toothache) UNUSUAL RASH, SWELLING OR PAIN  UNUSUAL VAGINAL DISCHARGE OR ITCHING   Items with * indicate a potential emergency and should be followed up as soon as possible or go to the Emergency Department if any problems should occur.  Please show the CHEMOTHERAPY ALERT CARD or IMMUNOTHERAPY ALERT CARD at check-in  to the Emergency Department and triage nurse.  Should you have questions after your visit or need to cancel or reschedule your appointment, please contact [facility]  Dept: [phone]  and follow the prompts.  Office hours are 8:00 a.m. to 4:30 p.m. Monday - Friday. Please note that voicemails left after 4:00 p.m. may not be returned until the following business day.  We are closed weekends and major holidays. You have access to a nurse at all times for urgent questions. Please call the main number to the clinic Dept: [phone] and follow the prompts.   For any non-urgent questions, you may also contact your provider using MyChart. We now offer e-Visits for anyone 18 and older to request care online for non-urgent symptoms. For details visit [email or web address].   Also download the MyChart app! Go to the app store, search "MyChart", open the app, select [facility], and log in with your MyChart username and password.   

## 2022-08-10 NOTE — Progress Notes (Signed)
Pt c/o of SOB and appeared to have increased pallor compared to baseline at the start of tx. This RN assessed Pt's O2 saturation via dynamap. Pt informed this RN that he uses oxygen at home as needed. Upon assessment SPO2 found to be 83%. This RN placed Pt on 2 L of O2 via n/c. This RN reassessed Pt's O2 saturation and found it to be 97% on 2L of O2 after about a minute on oxygen therapy. This RN made Dr. Julien Nordmann aware.  This RN monitored the Pt after treatment without oxygen for 15 minutes. Pt's O2 saturation remained consistently 90-92%. This RN made Dr. Julien Nordmann aware. Per Dr. Julien Nordmann OK to d/c Pt home. This RN educated Pt on monitoring oxygen saturation levels and using his home oxygen as needed. Pt and Pt's family were agreeable to education and verbalized understanding. Pt's VSS at discharge.  Pt ambulated to lobby without oxygen at the time of discharge.

## 2022-08-10 NOTE — Progress Notes (Signed)
Patient seen by Dr. Inez Pilgrim are not all within treatment parameters. Per Dr. Julien Nordmann , it is ok to treat pt today with  Taxol and heart rate of 110. Labs reviewed: and are within treatment parameters.  Per physician team, patient is ready for treatment and there are NO modifications to the treatment plan.

## 2022-08-10 NOTE — Progress Notes (Signed)
[facility] [phone]   [phone]  OFFICE PROGRESS NOTE  Paul Noble, MD [address] [place] [address]  DIAGNOSIS:  1) recurrent non-small cell lung cancer, squamous cell carcinoma initially diagnosed as stage IIIb (T3, N2, M0) non-small cell lung cancer, squamous cell carcinoma presented with large right upper lobe perihilar mass with right hilar and subcarinal lymphadenopathy diagnosed in April 2022. 2) left lower extremity deep venous thrombosis diagnosed in October 2022.  Currently on treatment with Xarelto.  PRIOR THERAPY:  1) Concurrent chemoradiation with weekly carboplatin for AUC of 2 and paclitaxel 45 Mg/M2.  Status post 8 cycles.  Last dose was given November 10, 2020. 2) Consolidation treatment with immunotherapy with Imfinzi 1500 Mg IV every 4 weeks.  First dose December 14, 2020.  Status post 3 cycles.  This was discontinued secondary to intolerance with highly suspicious immunotherapy mediated pneumonitis.  CURRENT THERAPY: Systemic chemotherapy with carboplatin for AUC of 5 and paclitaxel 175 Mg/M2 with Neulasta support every 3 weeks.  Status post 4 cycles.  INTERVAL HISTORY: Paul Mclean 62 y.o. male returns to the clinic today for follow-up visit accompanied by his daughter.  His wife was available by phone during the visit.  The patient is feeling fine except for fatigue and he has 1 day that he was exhausted but recovered well.  He also has mild peripheral neuropathy.  He denied having any current chest pain, shortness of breath, cough or hemoptysis.  He has no nausea, vomiting, diarrhea or constipation.  He has no headache or visual changes.  He has been tolerating his treatment with chemotherapy fairly well except for the fatigue.  Is here today for evaluation before starting cycle #5.    MEDICAL HISTORY: Past Medical History:  Diagnosis Date   Atrial flutter ([place])    question of brief aflutter 02/28/22 in setting of  acute pericarditis (not captured on ECG)   Cancer ([place])    right lung cancer   DVT (deep venous thrombosis) ([place]) 02/27/2021   LLE partially occlusive DVT 02/25/21, started on Xarelto   Dyspnea    due to cancer per pt   History of radiation therapy 11/12/2020   right lung  09/30/2020-11/12/2020  Dr Gery Pray   Hypothyroidism    Thyroid disease     ALLERGIES:  is allergic to carboplatin.  MEDICATIONS:  Current Outpatient Medications  Medication Sig Dispense Refill   albuterol (VENTOLIN HFA) 108 (90 Base) MCG/ACT inhaler Inhale 2 puffs into the lungs every 6 (six) hours as needed for wheezing or shortness of breath. 8 g 2   Budeson-Glycopyrrol-Formoterol (BREZTRI AEROSPHERE) 160-9-4.8 MCG/ACT AERO Inhale 2 puffs into the lungs in the morning and at bedtime. 10.7 g 11   diazepam (VALIUM) 2 MG tablet Take 1 tablet (2 mg total) by mouth at bedtime. 15 tablet 0   furosemide (LASIX) 20 MG tablet TAKE 1 TABLET BY MOUTH DAILY AS NEEDED FOR SWELLING 20 tablet 0   gabapentin (NEURONTIN) 300 MG capsule Take 1 capsule in the morning and 3 capsules at bedtime. 120 capsule 0   ibuprofen (ADVIL) 200 MG tablet Take 200 mg by mouth in the morning and at bedtime.     ipratropium-albuterol (DUONEB) 0.5-2.5 (3) MG/3ML SOLN Inhale 3 mLs into the lungs every 6 (six) hours as needed (wheezing/shortness of breath).     levothyroxine (SYNTHROID) 112 MCG tablet Take 112 mcg by mouth daily before breakfast.     morphine (MS  CONTIN) 30 MG 12 hr tablet Take 1 tablet (30 mg total) by mouth every 8 (eight) hours. 90 tablet 0   morphine (MSIR) 15 MG tablet Take 1 tablet (15 mg total) by mouth every 4 (four) hours as needed for moderate pain or severe pain (pain.). 90 tablet 0   ondansetron (ZOFRAN) 8 MG tablet Take 1 tablet (8 mg total) by mouth every 8 (eight) hours as needed for nausea or vomiting. May take starting on day 3 following chemotherapy 30 tablet 1   pantoprazole (PROTONIX) 40 MG tablet Take 1 tablet (40 mg  total) by mouth daily. 30 tablet 2   polyethylene glycol powder (MIRALAX) 17 GM/SCOOP powder Take 1 scoop (17GM) once daily. 255 g 0   potassium chloride (KLOR-CON M) 10 MEQ tablet Take 1 tablet (10 mEq total) by mouth daily. 5 tablet 0   predniSONE (DELTASONE) 10 MG tablet Take 5 mg by mouth in the morning.     XARELTO 20 MG TABS tablet TAKE 1 TABLET(20 MG) BY MOUTH DAILY WITH SUPPER (Patient taking differently: Take 20 mg by mouth in the morning.) 30 tablet 3   No current facility-administered medications for this visit.    SURGICAL HISTORY:  Past Surgical History:  Procedure Laterality Date   APPENDECTOMY     BRONCHIAL BIOPSY  09/01/2020   Procedure: BRONCHIAL BIOPSIES;  Surgeon: Garner Nash, DO;  Location: [facility];  Service: Pulmonary;;   BRONCHIAL BIOPSY  04/05/2022   Procedure: BRONCHIAL BIOPSIES;  Surgeon: Garner Nash, DO;  Location: [facility];  Service: Pulmonary;;   BRONCHIAL BRUSHINGS  09/01/2020   Procedure: BRONCHIAL BRUSHINGS;  Surgeon: Garner Nash, DO;  Location: [facility];  Service: Pulmonary;;   BRONCHIAL NEEDLE ASPIRATION BIOPSY  09/01/2020   Procedure: BRONCHIAL NEEDLE ASPIRATION BIOPSIES;  Surgeon: Garner Nash, DO;  Location: [facility];  Service: Pulmonary;;   BRONCHIAL WASHINGS  09/01/2020   Procedure: BRONCHIAL WASHINGS;  Surgeon: Garner Nash, DO;  Location: [facility];  Service: Pulmonary;;   FINE NEEDLE ASPIRATION  04/05/2022   Procedure: FINE NEEDLE ASPIRATION (FNA) LINEAR;  Surgeon: Garner Nash, DO;  Location: [facility];  Service: Pulmonary;;   HERNIA REPAIR     INCISION AND DRAINAGE ABSCESS N/A 08/25/2017   Procedure: INCISION AND DRAINAGE perineal  ABSCESS;  Surgeon: Festus Aloe, MD;  Location: [facility];  Service: Urology;  Laterality: N/A;   VIDEO BRONCHOSCOPY WITH ENDOBRONCHIAL ULTRASOUND N/A 09/01/2020   Procedure: VIDEO BRONCHOSCOPY WITH ENDOBRONCHIAL ULTRASOUND;  Surgeon: Garner Nash, DO;  Location: [facility];  Service: Pulmonary;  Laterality: N/A;   VIDEO BRONCHOSCOPY WITH ENDOBRONCHIAL ULTRASOUND Right 04/05/2022   Procedure: VIDEO BRONCHOSCOPY WITH ENDOBRONCHIAL ULTRASOUND;  Surgeon: Garner Nash, DO;  Location: [facility];  Service: Pulmonary;  Laterality: Right;    REVIEW OF SYSTEMS:  A comprehensive review of systems was negative except for: Constitutional: positive for fatigue Neurological: positive for paresthesia   PHYSICAL EXAMINATION: General appearance: alert, cooperative, fatigued, and no distress Head: Normocephalic, without obvious abnormality, atraumatic Neck: no adenopathy, no JVD, supple, symmetrical, trachea midline, and thyroid not enlarged, symmetric, no tenderness/mass/nodules Lymph nodes: Cervical, supraclavicular, and axillary nodes normal. Resp: clear to auscultation bilaterally Back: symmetric, no curvature. ROM normal. No CVA tenderness. Cardio: regular rate and rhythm, S1, S2 normal, no murmur, click, rub or gallop GI: soft, non-tender; bowel sounds normal; no masses,  no organomegaly Extremities: extremities normal, atraumatic, no cyanosis or edema  ECOG PERFORMANCE STATUS: 1 - Symptomatic but completely  ambulatory  Blood pressure 122/82, pulse (!) 110, temperature 98.2 F (36.8 C), temperature source Oral, resp. rate 18, height 5\' 9"  (1.753 m), weight 201 lb 6.4 oz (91.4 kg), SpO2 91 %.  LABORATORY DATA: Lab Results  Component Value Date   WBC 4.8 07/20/2022   HGB 11.2 (L) 07/20/2022   HCT 33.1 (L) 07/20/2022   MCV 89.9 07/20/2022   PLT 257 07/20/2022      Chemistry      Component Value Date/Time   NA 139 07/20/2022 0816   K 3.7 07/20/2022 0816   CL 98 07/20/2022 0816   CO2 33 (H) 07/20/2022 0816   BUN 10 07/20/2022 0816   CREATININE 0.85 07/20/2022 0816      Component Value Date/Time   CALCIUM 9.4 07/20/2022 0816   ALKPHOS 79 07/20/2022 0816   AST 23 07/20/2022 0816   ALT 31 07/20/2022 0816   BILITOT 0.5 07/20/2022 0816        RADIOGRAPHIC STUDIES: CT Chest W Contrast  Result Date: 07/20/2022 CLINICAL DATA:  Restaging non-small cell lung cancer. * Tracking Code: BO * EXAM: CT CHEST WITH CONTRAST TECHNIQUE: Multidetector CT imaging of the chest was performed during intravenous contrast administration. RADIATION DOSE REDUCTION: This exam was performed according to the departmental dose-optimization program which includes automated exposure control, adjustment of the mA and/or kV according to patient size and/or use of iterative reconstruction technique. CONTRAST:  59mL OMNIPAQUE IOHEXOL 300 MG/ML  SOLN COMPARISON:  Numerous prior imaging studies. The most recent CT scan is 12/13/2021 and the most recent PET-CT is 04/20/2022 FINDINGS: Cardiovascular: The heart is normal in size. No pericardial effusion. The aorta is normal in caliber. No dissection. The branch vessels are patent. No definite coronary artery calcifications. Mediastinum/Nodes: No mediastinal or hilar mass or lymphadenopathy. Stable small subcarinal lymph measuring 7 mm. The esophagus is grossly normal. Lungs/Pleura: The hypermetabolic right upper lobe/suprahilar masses has decreased in size since the PET-CT. On that study it measured approximately 6.1 x 5.2 cm and now measures 5.2 x 3.5 cm. 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 No new pulmonary nodules to suggest pulmonary metastatic disease. Small right pleural effusion with minimal overlying atelectasis. Stable underlying emphysematous changes and pulmonary scarring. Upper Abdomen: Stable advanced hepatic steatosis. No hepatic or adrenal gland lesions are identified. No upper abdominal lymphadenopathy. Musculoskeletal: No chest wall mass, supraclavicular or axillary adenopathy. Small scattered lymph nodes are stable. No significant bony findings. Stable sclerotic changes associated with thoracic and lumbar spine Schmorl's nodes. IMPRESSION: 1. Interval  decrease in size of the right upper lobe/suprahilar mass consistent with a positive response to treatment. 2. 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 3. No new pulmonary nodules to suggest pulmonary metastatic disease. 4. Small right pleural effusion with minimal overlying atelectasis. 5. Stable emphysematous changes and pulmonary scarring. 6. Stable advanced hepatic steatosis. Emphysema (ICD10-J43.9). Electronically Signed   By: Marijo Sanes M.D.   On: 07/20/2022 09:56     ASSESSMENT AND PLAN: This is a very pleasant 62 years old white male recently diagnosed with stage IIIb (T3, N2, M0) non-small cell lung cancer, squamous cell carcinoma presented with large right upper lobe perihilar mass with right hilar and subcarinal lymphadenopathy diagnosed in April 2022. The patient underwent a course of concurrent chemoradiation with weekly carboplatin and paclitaxel status post 8 cycles.  The patient tolerated his treatment well except for fatigue. His scan showed partial response with improvement in the right hilar mass as  well as resolution of the right upper lobe lung nodule. The patient started on treatment with consolidation immunotherapy with Imfinzi 1500 Mg IV every 4 weeks status post 3 cycles.  He has been tolerating the treatment well except for the worsening cough and shortness of breath. 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  He has repeat bronchoscopy by Dr. Valeta Harms that showed evidence for residual/recurrent squamous cell carcinoma of the lung in the right upper lobe.  The patient also had a PET scan and MRI performed recently.  I personally and independently reviewed the images and discussed  the result and showed the the images to the patient and his wife. His scan showed worsening and progressive hypermetabolic activity in the right upper lobe/hilar mass concerning for progressive disease locally. He was seen by Dr. Sondra Come for consideration of palliative radiotherapy to the recurrent disease in the right lung but Dr. Sondra Come did not feel that he can offer the patient any additional radiation because of the high-dose he received in the past. The patient started systemic chemotherapy with carboplatin for AUC of 5, paclitaxel 175 Mg/M2 and Keytruda 200 Mg IV every 3 weeks with Neulasta support last week.  Status post 4 cycles.  The patient has been tolerating this treatment fairly well except for the fatigue and peripheral neuropathy. I recommended for him to proceed with cycle #5 today as planned. I will see him back for follow-up visit in 3 weeks for evaluation before the last cycle of his treatment. For the pain management, he will continue on MS Contin as well as MS IR.  He is also seen by the palliative care team for management of his pain issues. For the swelling of the lower extremities is significantly improved with the Lasix treatment. For the history of pulmonary embolism, he will continue his current treatment with Xarelto. He was advised to call immediately if he has any other concerning symptoms in the interval. All questions were answered. The patient knows to call the clinic with any problems, questions or concerns. We can certainly see the patient much sooner if necessary.  The total time spent in the appointment was 20 minutes.  Disclaimer: This note was dictated with voice recognition software. Similar sounding words can inadvertently be transcribed and may not be corrected upon review.

## 2022-08-12 ENCOUNTER — Inpatient Hospital Stay: Payer: Managed Care, Other (non HMO)

## 2022-08-12 ENCOUNTER — Other Ambulatory Visit: Payer: Self-pay

## 2022-08-12 VITALS — BP 135/96 | HR 103 | Temp 98.0°F | Resp 20

## 2022-08-12 DIAGNOSIS — Z5111 Encounter for antineoplastic chemotherapy: Secondary | ICD-10-CM | POA: Diagnosis not present

## 2022-08-12 DIAGNOSIS — C3491 Malignant neoplasm of unspecified part of right bronchus or lung: Secondary | ICD-10-CM

## 2022-08-12 MED ORDER — PEGFILGRASTIM INJECTION 6 MG/0.6ML ~~LOC~~
6.0000 mg | PREFILLED_SYRINGE | Freq: Once | SUBCUTANEOUS | Status: AC
  Administered 2022-08-12: 6 mg via SUBCUTANEOUS
  Filled 2022-08-12: qty 0.6

## 2022-08-12 NOTE — Patient Instructions (Signed)

## 2022-08-30 MED FILL — Dexamethasone Sodium Phosphate Inj 100 MG/10ML: INTRAMUSCULAR | Qty: 1 | Status: AC

## 2022-08-30 NOTE — Progress Notes (Signed)
Palliative Medicine [facility]  Telephone:([phone] Fax:(336) [phone]   Name: Paul Mclean Date: 08/30/2022 MRN: [id number]  DOB: 1961-03-31  Patient Care Team: [name], MD as PCP - General (Internal Medicine) Pickenpack-Cousar, Arty Baumgartner, NP as Nurse Practitioner (Nurse Practitioner)    INTERVAL HISTORY: Paul Mclean is a 62 y.o. 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  Palliative ask to see for pain management and goals of care.  SOCIAL HISTORY:    Paul Mclean reports that he quit smoking about 16 years ago. His smoking use included cigarettes. He has a 60.00 pack-year smoking history. He has never used smokeless tobacco. He reports current alcohol use of about 1.0 - 2.0 standard drink of alcohol per week. He reports that he does not use drugs.  ADVANCE DIRECTIVES:  Advanced directives on file  CODE STATUS: Full code  PAST MEDICAL HISTORY: Past Medical History:  Diagnosis Date   Atrial flutter    question of brief aflutter 02/28/22 in setting of acute pericarditis (not captured on ECG)   Cancer    right lung cancer   DVT (deep venous thrombosis) 02/27/2021   LLE partially occlusive DVT 02/25/21, started on Xarelto   Dyspnea    due to cancer per pt   History of radiation therapy 11/12/2020   right lung  09/30/2020-11/12/2020  Dr Antony Blackbird   Hypothyroidism    Thyroid disease     ALLERGIES:  is allergic to carboplatin.  MEDICATIONS:  Current Outpatient Medications  Medication Sig Dispense Refill   albuterol (VENTOLIN HFA) 108 (90 Base) MCG/ACT inhaler Inhale 2 puffs into the lungs every 6 (six) hours as needed for wheezing or shortness of breath. 8 g 2   Budeson-Glycopyrrol-Formoterol (BREZTRI AEROSPHERE) 160-9-4.8 MCG/ACT AERO Inhale 2 puffs into the lungs in the morning and at bedtime. 10.7 g 11   diazepam (VALIUM) 2 MG tablet  Take 1 tablet (2 mg total) by mouth every 12 (twelve) hours as needed for anxiety. 45 tablet 0   furosemide (LASIX) 20 MG tablet TAKE 1 TABLET BY MOUTH DAILY AS NEEDED FOR SWELLING 20 tablet 0   gabapentin (NEURONTIN) 300 MG capsule Take 1 capsule in the morning and 3 capsules at bedtime. 120 capsule 0   ibuprofen (ADVIL) 200 MG tablet Take 200 mg by mouth in the morning and at bedtime.     ipratropium-albuterol (DUONEB) 0.5-2.5 (3) MG/3ML SOLN Inhale 3 mLs into the lungs every 6 (six) hours as needed (wheezing/shortness of breath).     levothyroxine (SYNTHROID) 112 MCG tablet Take 112 mcg by mouth daily before breakfast.     morphine (MS CONTIN) 30 MG 12 hr tablet Take 1 tablet (30 mg total) by mouth every 8 (eight) hours. 90 tablet 0   morphine (MSIR) 15 MG tablet Take 1 tablet (15 mg total) by mouth every 4 (four) hours as needed for moderate pain or severe pain (pain.). 90 tablet 0   ondansetron (ZOFRAN) 8 MG tablet Take 1 tablet (8 mg total) by mouth every 8 (eight) hours as needed for nausea or vomiting. May take starting on day 3 following chemotherapy 30 tablet 1   pantoprazole (PROTONIX) 40 MG tablet Take 1 tablet (40 mg total) by mouth daily. 30 tablet 2   polyethylene glycol powder (MIRALAX) 17 GM/SCOOP powder Take 1 scoop (17GM) once daily. 255 g 0  potassium chloride (KLOR-CON M) 10 MEQ tablet Take 1 tablet (10 mEq total) by mouth daily. 5 tablet 0   predniSONE (DELTASONE) 10 MG tablet Take 5 mg by mouth in the morning.     XARELTO 20 MG TABS tablet TAKE 1 TABLET(20 MG) BY MOUTH DAILY WITH SUPPER (Patient taking differently: Take 20 mg by mouth in the morning.) 30 tablet 3   No current facility-administered medications for this visit.    VITAL SIGNS: BP 136/84 (BP Location: Right Arm, Patient Position: Sitting)   Pulse (!) 122 Comment: nurse notified  Temp 98.2 F (36.8 C) (Oral)   Resp 18   Ht 5\' 9"  (1.753 m)   Wt 90.4 kg   SpO2 97%   BMI 29.43 kg/m  Filed Weights    08/31/22 1029  Weight: 90.4 kg    Estimated body mass index is 29.43 kg/m as calculated from the following:   Height as of this encounter: 5\' 9"  (1.753 m).   Weight as of this encounter: 90.4 kg.   PERFORMANCE STATUS (ECOG) : 1 - Symptomatic but completely ambulatory   Physical Exam General: NAD Cardiovascular: regular rate and rhythm Pulmonary: normal breathing pattern, lung sounds diminished right > left Abdomen: soft, nontender, + bowel sounds Extremities: no edema, no joint deformities Skin: no rashes Neurological: AAO x 4, slowed responses  IMPRESSION: Paul Mclean presents to visit with his wife and daughter. He expresses appreciation for the involvement and support in his care. No acute distress. Continuing to take things one day at a time. Remaining as active as possible.   Neoplasm related pain Paul Mclean reports that his pain is managed with current medication regimen.   We discussed his current regimen: MS Contin every 8 hours, MS IR 15 mg as needed for breakthrough pain (requiring this about once per day), and Gabapentin 300 mg in the morning, 900 mg at bedtime. Is also using Claritin before chemotherapy treatments.  2.   Anxiety Reports that Valium has been extremely helpful with allowing patient to rest at night. He is using this most nights.  Does experience anxiety with infusion days/multiple appointments. Advised patient and family that he may increase the Valium to twice daily as needed to help with day-time anxiety.  We will continue to closely monitor and provide support. Family aware to call in to the clinic for refill and for any questions, concerns, or needs.   Decreased appetite/weight loss Paul Mclean endorses a fluctuating appetite. Some days he only feels like eating 25-50% of meals. Weight has had slight decrease from 91.4 kg (4/3) to 91 kg (today).  Family reports that his taste buds have changed. Patient no longer likes the things that he used to such  as soup, grilled cheese sandwiches, and shrimp. He is now "craving" foods such as cheeseburgers, steak and cheese subs, spaghetti, and toast. Paul Mclean expresses appreciation that his family is willing to make frequent trips to the grocery store   Daughter has questions about patient receiving vitamin infusions. She is directed to reach out to Oncology for guidance on this.  We discussed the importance of continued conversation with family and their medical providers regarding overall plan of care and treatment options, ensuring decisions are within the context of the patients values and GOCs.  PLAN:  MS Contin 30 mg three times daily. MS IR 15mg  every 4-6 hours as needed for breakthrough pain. Taking about once per day. Gabapentin 600 mg daily and 900 mg at bedtime. Increase Valium to  2 mg BID as needed for anxiety. Miralax daily for bowel regimen. Instructed family to reach out to Oncology for their question regarding the possibility of patient receiving vitamin infusions. We will continue to closely monitor and assist with symptom management.  Palliative team will plan to see patient back in 3-4 weeks in collaboration to other oncology appointments. Patient and family knows to contact office if needed sooner.    Patient expressed understanding and was in agreement with this plan. He also understands that He can call the clinic at any time with any questions, concerns, or complaints.   Any controlled substances utilized were prescribed in the context of palliative care. PDMP has been reviewed.   I assessed patient with Marylene Land, NP Student. Agree with above findings.   Visit consisted of counseling and education dealing with the complex and emotionally intense issues of symptom management and palliative care in the setting of serious and potentially life-threatening illness.Greater than 50%  of this time was spent counseling and coordinating care related to the above assessment and  plan.   Signed by: Katy Apo, RN MSN [facility] / NP Student  Willette Alma, AGPCNP-BC  Palliative Medicine Team/[facility]  *Please note that this is a verbal dictation therefore any spelling or grammatical errors are due to the "Dragon Medical One" system interpretation.

## 2022-08-31 ENCOUNTER — Inpatient Hospital Stay: Payer: Managed Care, Other (non HMO)

## 2022-08-31 ENCOUNTER — Other Ambulatory Visit: Payer: Self-pay

## 2022-08-31 ENCOUNTER — Encounter: Payer: Self-pay | Admitting: Medical Oncology

## 2022-08-31 ENCOUNTER — Inpatient Hospital Stay (HOSPITAL_BASED_OUTPATIENT_CLINIC_OR_DEPARTMENT_OTHER): Payer: Managed Care, Other (non HMO) | Admitting: Internal Medicine

## 2022-08-31 ENCOUNTER — Encounter: Payer: Self-pay | Admitting: Nurse Practitioner

## 2022-08-31 ENCOUNTER — Inpatient Hospital Stay (HOSPITAL_BASED_OUTPATIENT_CLINIC_OR_DEPARTMENT_OTHER): Payer: Managed Care, Other (non HMO) | Admitting: Nurse Practitioner

## 2022-08-31 ENCOUNTER — Encounter: Payer: Self-pay | Admitting: Internal Medicine

## 2022-08-31 VITALS — BP 136/84 | HR 122 | Temp 98.2°F | Resp 18 | Ht 69.0 in | Wt 199.3 lb

## 2022-08-31 VITALS — BP 124/82 | HR 117 | Temp 97.7°F | Resp 18 | Wt 200.7 lb

## 2022-08-31 VITALS — BP 139/85 | HR 99 | Resp 18

## 2022-08-31 DIAGNOSIS — Z515 Encounter for palliative care: Secondary | ICD-10-CM

## 2022-08-31 DIAGNOSIS — C3491 Malignant neoplasm of unspecified part of right bronchus or lung: Secondary | ICD-10-CM

## 2022-08-31 DIAGNOSIS — G893 Neoplasm related pain (acute) (chronic): Secondary | ICD-10-CM

## 2022-08-31 DIAGNOSIS — F419 Anxiety disorder, unspecified: Secondary | ICD-10-CM | POA: Diagnosis not present

## 2022-08-31 DIAGNOSIS — R530 Neoplastic (malignant) related fatigue: Secondary | ICD-10-CM

## 2022-08-31 DIAGNOSIS — C3490 Malignant neoplasm of unspecified part of unspecified bronchus or lung: Secondary | ICD-10-CM | POA: Diagnosis not present

## 2022-08-31 DIAGNOSIS — Z5111 Encounter for antineoplastic chemotherapy: Secondary | ICD-10-CM | POA: Diagnosis not present

## 2022-08-31 LAB — CMP (CANCER CENTER ONLY)
ALT: 18 U/L (ref 0–44)
AST: 18 U/L (ref 15–41)
Albumin: 3.6 g/dL (ref 3.5–5.0)
Alkaline Phosphatase: 66 U/L (ref 38–126)
Anion gap: 5 (ref 5–15)
BUN: 14 mg/dL (ref 6–20)
CO2: 36 mmol/L — ABNORMAL HIGH (ref 22–32)
Calcium: 10 mg/dL (ref 8.9–10.3)
Chloride: 97 mmol/L — ABNORMAL LOW (ref 98–111)
Creatinine: 0.91 mg/dL (ref 0.61–1.24)
GFR, Estimated: >60 mL/min (ref >60)
Glucose, Bld: 117 mg/dL — ABNORMAL HIGH (ref 70–99)
Potassium: 4.4 mmol/L (ref 3.5–5.1)
Sodium: 138 mmol/L (ref 135–145)
Total Bilirubin: 1.1 mg/dL (ref 0.3–1.2)
Total Protein: 7.4 g/dL (ref 6.5–8.1)

## 2022-08-31 LAB — CBC WITH DIFFERENTIAL (CANCER CENTER ONLY)
Abs Immature Granulocytes: 0.04 10*3/uL (ref 0.00–0.07)
Basophils Absolute: 0.1 10*3/uL (ref 0.0–0.1)
Basophils Relative: 1 %
Eosinophils Absolute: 0.1 10*3/uL (ref 0.0–0.5)
Eosinophils Relative: 1 %
HCT: 34.7 % — ABNORMAL LOW (ref 39.0–52.0)
Hemoglobin: 11.3 g/dL — ABNORMAL LOW (ref 13.0–17.0)
Immature Granulocytes: 1 %
Lymphocytes Relative: 18 %
Lymphs Abs: 1.5 10*3/uL (ref 0.7–4.0)
MCH: 31.7 pg (ref 26.0–34.0)
MCHC: 32.6 g/dL (ref 30.0–36.0)
MCV: 97.5 fL (ref 80.0–100.0)
Monocytes Absolute: 1.2 10*3/uL — ABNORMAL HIGH (ref 0.1–1.0)
Monocytes Relative: 14 %
Neutro Abs: 5.6 10*3/uL (ref 1.7–7.7)
Neutrophils Relative %: 65 %
Platelet Count: 299 10*3/uL (ref 150–400)
RBC: 3.56 MIL/uL — ABNORMAL LOW (ref 4.22–5.81)
RDW: 15.2 % (ref 11.5–15.5)
WBC Count: 8.4 10*3/uL (ref 4.0–10.5)
nRBC: 0 % (ref 0.0–0.2)

## 2022-08-31 MED ORDER — SODIUM CHLORIDE 0.9 % IV SOLN: Freq: Once | INTRAVENOUS | Status: AC

## 2022-08-31 MED ORDER — DIAZEPAM 2 MG PO TABS
2.0000 mg | ORAL_TABLET | Freq: Two times a day (BID) | ORAL | 0 refills | Status: DC | PRN
Start: 2022-08-31 — End: 2022-09-27

## 2022-08-31 MED ORDER — PALONOSETRON HCL INJECTION 0.25 MG/5ML
0.2500 mg | Freq: Once | INTRAVENOUS | Status: AC
  Administered 2022-08-31: 0.25 mg via INTRAVENOUS
  Filled 2022-08-31: qty 5

## 2022-08-31 MED ORDER — SODIUM CHLORIDE 0.9 % IV SOLN
10.0000 mg | Freq: Once | INTRAVENOUS | Status: AC
  Administered 2022-08-31: 10 mg via INTRAVENOUS
  Filled 2022-08-31: qty 10

## 2022-08-31 MED ORDER — SODIUM CHLORIDE 0.9 % IV SOLN
175.0000 mg/m2 | Freq: Once | INTRAVENOUS | Status: AC
  Administered 2022-08-31: 378 mg via INTRAVENOUS
  Filled 2022-08-31: qty 63

## 2022-08-31 MED ORDER — DIPHENHYDRAMINE HCL 50 MG/ML IJ SOLN
50.0000 mg | Freq: Once | INTRAMUSCULAR | Status: AC
  Administered 2022-08-31: 50 mg via INTRAVENOUS
  Filled 2022-08-31: qty 1

## 2022-08-31 MED ORDER — FAMOTIDINE IN NACL 20-0.9 MG/50ML-% IV SOLN
20.0000 mg | Freq: Once | INTRAVENOUS | Status: AC
  Administered 2022-08-31: 20 mg via INTRAVENOUS
  Filled 2022-08-31: qty 50

## 2022-08-31 NOTE — Progress Notes (Signed)
[facility] [phone]) [phone]   Fax:(336) [phone]  OFFICE PROGRESS NOTE  [name], MD [address] [place] [address]  DIAGNOSIS:  1) recurrent non-small cell lung cancer, squamous cell carcinoma initially diagnosed as stage IIIb (T3, N2, M0) non-small cell lung cancer, squamous cell carcinoma presented with large right upper lobe perihilar mass with right hilar and subcarinal lymphadenopathy diagnosed in April 2022. 2) left lower extremity deep venous thrombosis diagnosed in October 2022.  Currently on treatment with Xarelto.  PRIOR THERAPY:  1) Concurrent chemoradiation with weekly carboplatin for AUC of 2 and paclitaxel 45 Mg/M2.  Status post 8 cycles.  Last dose was given November 10, 2020. 2) Consolidation treatment with immunotherapy with Imfinzi 1500 Mg IV every 4 weeks.  First dose December 14, 2020.  Status post 3 cycles.  This was discontinued secondary to intolerance with highly suspicious immunotherapy mediated pneumonitis.  CURRENT THERAPY: Systemic chemotherapy with carboplatin for AUC of 5 and paclitaxel 175 Mg/M2 with Neulasta support every 3 weeks.  Status post 5 cycles.  INTERVAL HISTORY: Paul Mclean 62 y.o. male returns to the clinic today for follow-up visit accompanied by his wife and daughter.  The patient continues to complain of increasing fatigue and weakness as well as peripheral neuropathy worse in the toes.  He also has near falls and sleeps a lot with his chemotherapy.  He denied having any current chest pain, but has shortness of breath shortness of breath at baseline increased with exertion with no hemoptysis.  He has no fever or chills.  The patient has no recent weight loss or night sweats.  He is here today for evaluation before starting cycle #6 of his treatment.  MEDICAL HISTORY: Past Medical History:  Diagnosis Date   Atrial flutter    question of brief aflutter 02/28/22 in setting of acute pericarditis (not captured  on ECG)   Cancer    right lung cancer   DVT (deep venous thrombosis) 02/27/2021   LLE partially occlusive DVT 02/25/21, started on Xarelto   Dyspnea    due to cancer per pt   History of radiation therapy 11/12/2020   right lung  09/30/2020-11/12/2020  Dr Antony Blackbird   Hypothyroidism    Thyroid disease     ALLERGIES:  is allergic to carboplatin.  MEDICATIONS:  Current Outpatient Medications  Medication Sig Dispense Refill   albuterol (VENTOLIN HFA) 108 (90 Base) MCG/ACT inhaler Inhale 2 puffs into the lungs every 6 (six) hours as needed for wheezing or shortness of breath. 8 g 2   Budeson-Glycopyrrol-Formoterol (BREZTRI AEROSPHERE) 160-9-4.8 MCG/ACT AERO Inhale 2 puffs into the lungs in the morning and at bedtime. 10.7 g 11   diazepam (VALIUM) 2 MG tablet Take 1 tablet (2 mg total) by mouth at bedtime. 21 tablet 0   furosemide (LASIX) 20 MG tablet TAKE 1 TABLET BY MOUTH DAILY AS NEEDED FOR SWELLING 20 tablet 0   gabapentin (NEURONTIN) 300 MG capsule Take 1 capsule in the morning and 3 capsules at bedtime. 120 capsule 0   ibuprofen (ADVIL) 200 MG tablet Take 200 mg by mouth in the morning and at bedtime.     ipratropium-albuterol (DUONEB) 0.5-2.5 (3) MG/3ML SOLN Inhale 3 mLs into the lungs every 6 (six) hours as needed (wheezing/shortness of breath).     levothyroxine (SYNTHROID) 112 MCG tablet Take 112 mcg by mouth daily before breakfast.     morphine (MS CONTIN) 30 MG 12 hr tablet Take 1  tablet (30 mg total) by mouth every 8 (eight) hours. 90 tablet 0   morphine (MSIR) 15 MG tablet Take 1 tablet (15 mg total) by mouth every 4 (four) hours as needed for moderate pain or severe pain (pain.). 90 tablet 0   ondansetron (ZOFRAN) 8 MG tablet Take 1 tablet (8 mg total) by mouth every 8 (eight) hours as needed for nausea or vomiting. May take starting on day 3 following chemotherapy 30 tablet 1   pantoprazole (PROTONIX) 40 MG tablet Take 1 tablet (40 mg total) by mouth daily. 30 tablet 2    polyethylene glycol powder (MIRALAX) 17 GM/SCOOP powder Take 1 scoop (17GM) once daily. 255 g 0   potassium chloride (KLOR-CON M) 10 MEQ tablet Take 1 tablet (10 mEq total) by mouth daily. 5 tablet 0   predniSONE (DELTASONE) 10 MG tablet Take 5 mg by mouth in the morning.     XARELTO 20 MG TABS tablet TAKE 1 TABLET(20 MG) BY MOUTH DAILY WITH SUPPER (Patient taking differently: Take 20 mg by mouth in the morning.) 30 tablet 3   No current facility-administered medications for this visit.    SURGICAL HISTORY:  Past Surgical History:  Procedure Laterality Date   APPENDECTOMY     BRONCHIAL BIOPSY  09/01/2020   Procedure: BRONCHIAL BIOPSIES;  Surgeon: Josephine Igo, DO;  Location: [facility];  Service: Pulmonary;;   BRONCHIAL BIOPSY  04/05/2022   Procedure: BRONCHIAL BIOPSIES;  Surgeon: Josephine Igo, DO;  Location: [facility];  Service: Pulmonary;;   BRONCHIAL BRUSHINGS  09/01/2020   Procedure: BRONCHIAL BRUSHINGS;  Surgeon: Josephine Igo, DO;  Location: [facility];  Service: Pulmonary;;   BRONCHIAL NEEDLE ASPIRATION BIOPSY  09/01/2020   Procedure: BRONCHIAL NEEDLE ASPIRATION BIOPSIES;  Surgeon: Josephine Igo, DO;  Location: [facility];  Service: Pulmonary;;   BRONCHIAL WASHINGS  09/01/2020   Procedure: BRONCHIAL WASHINGS;  Surgeon: Josephine Igo, DO;  Location: [facility];  Service: Pulmonary;;   FINE NEEDLE ASPIRATION  04/05/2022   Procedure: FINE NEEDLE ASPIRATION (FNA) LINEAR;  Surgeon: Josephine Igo, DO;  Location: [facility];  Service: Pulmonary;;   HERNIA REPAIR     INCISION AND DRAINAGE ABSCESS N/A 08/25/2017   Procedure: INCISION AND DRAINAGE perineal  ABSCESS;  Surgeon: Jerilee Field, MD;  Location: [facility];  Service: Urology;  Laterality: N/A;   VIDEO BRONCHOSCOPY WITH ENDOBRONCHIAL ULTRASOUND N/A 09/01/2020   Procedure: VIDEO BRONCHOSCOPY WITH ENDOBRONCHIAL ULTRASOUND;  Surgeon: Josephine Igo, DO;  Location: [facility];  Service: Pulmonary;   Laterality: N/A;   VIDEO BRONCHOSCOPY WITH ENDOBRONCHIAL ULTRASOUND Right 04/05/2022   Procedure: VIDEO BRONCHOSCOPY WITH ENDOBRONCHIAL ULTRASOUND;  Surgeon: Josephine Igo, DO;  Location: [facility];  Service: Pulmonary;  Laterality: Right;    REVIEW OF SYSTEMS:  Constitutional: positive for fatigue Eyes: negative Ears, nose, mouth, throat, and face: negative Respiratory: positive for cough and dyspnea on exertion Cardiovascular: negative Gastrointestinal: negative Genitourinary:negative Integument/breast: negative Hematologic/lymphatic: negative Musculoskeletal:negative Neurological: positive for paresthesia Behavioral/Psych: negative Endocrine: negative Allergic/Immunologic: negative   PHYSICAL EXAMINATION: General appearance: alert, cooperative, fatigued, and no distress Head: Normocephalic, without obvious abnormality, atraumatic Neck: no adenopathy, no JVD, supple, symmetrical, trachea midline, and thyroid not enlarged, symmetric, no tenderness/mass/nodules Lymph nodes: Cervical, supraclavicular, and axillary nodes normal. Resp: clear to auscultation bilaterally Back: symmetric, no curvature. ROM normal. No CVA tenderness. Cardio: regular rate and rhythm, S1, S2 normal, no murmur, click, rub or gallop GI: soft, non-tender; bowel sounds normal; no masses,  no organomegaly Extremities:  extremities normal, atraumatic, no cyanosis or edema Neurologic: Alert and oriented X 3, normal strength and tone. Normal symmetric reflexes. Normal coordination and gait  ECOG PERFORMANCE STATUS: 1 - Symptomatic but completely ambulatory  Blood pressure 124/82, pulse (!) 117, temperature 97.7 F (36.5 C), temperature source Temporal, resp. rate 18, weight 200 lb 11.2 oz (91 kg), SpO2 (!) 83 %.  LABORATORY DATA: Lab Results  Component Value Date   WBC 6.0 08/10/2022   HGB 10.4 (L) 08/10/2022   HCT 30.8 (L) 08/10/2022   MCV 93.9 08/10/2022   PLT 242 08/10/2022      Chemistry       Component Value Date/Time   NA 139 08/10/2022 1010   K 3.4 (L) 08/10/2022 1010   CL 100 08/10/2022 1010   CO2 30 08/10/2022 1010   BUN 11 08/10/2022 1010   CREATININE 0.81 08/10/2022 1010      Component Value Date/Time   CALCIUM 9.4 08/10/2022 1010   ALKPHOS 72 08/10/2022 1010   AST 21 08/10/2022 1010   ALT 29 08/10/2022 1010   BILITOT 0.5 08/10/2022 1010       RADIOGRAPHIC STUDIES: No results found.   ASSESSMENT AND PLAN: This is a very pleasant 62 years old white male recently diagnosed with stage IIIb (T3, N2, M0) non-small cell lung cancer, squamous cell carcinoma presented with large right upper lobe perihilar mass with right hilar and subcarinal lymphadenopathy diagnosed in April 2022. The patient underwent a course of concurrent chemoradiation with weekly carboplatin and paclitaxel status post 8 cycles.  The patient tolerated his treatment well except for fatigue. His scan showed partial response with improvement in the right hilar mass as well as resolution of the right upper lobe lung nodule. The patient started on treatment with consolidation immunotherapy with Imfinzi 1500 Mg IV every 4 weeks status post 3 cycles.  He has been tolerating the treatment well except for the worsening cough and shortness of breath. 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  He has repeat bronchoscopy by Dr. Tonia Brooms that showed evidence for residual/recurrent squamous cell carcinoma of the lung in the right upper lobe.  The patient also had a PET scan and MRI performed recently.  I personally and independently reviewed the images and discussed the result and showed the the images to the patient and his wife. His scan showed  worsening and progressive hypermetabolic activity in the right upper lobe/hilar mass concerning for progressive disease locally. He was seen by Dr. Roselind Messier for consideration of palliative radiotherapy to the recurrent disease in the right lung but Dr. Roselind Messier did not feel that he can offer the patient any additional radiation because of the high-dose he received in the past. The patient started systemic chemotherapy with carboplatin for AUC of 5, paclitaxel 175 Mg/M2 and Keytruda 200 Mg IV every 3 weeks.  Status post 5 cycles.  He has been tolerating this treatment well with no concerning adverse effects except for fatigue as well as the peripheral neuropathy. I had a lengthy discussion with the patient today about proceeding with cycle #6 versus close monitoring and repeat imaging studies after cycle #5.  After a lengthy discussion with the patient and his family he decided to proceed with the treatment today  as planned.  His wife was completely against him receiving treatment from the beginning but she left it for him to decide regarding the last cycle of his treatment. I will see him back for follow-up visit in 4 weeks with repeat CT scan of the chest, abdomen and pelvis for restaging of his disease. For the pain management, he will continue on MS Contin as well as MS IR.  He is also seen by the palliative care team for management of his pain issues. For the history of pulmonary embolism, he will continue his current treatment with Xarelto. The patient was advised to call immediately if he has any other concerning symptoms in the interval. All questions were answered. The patient knows to call the clinic with any problems, questions or concerns. We can certainly see the patient much sooner if necessary.  The total time spent in the appointment was 30 minutes.  Disclaimer: This note was dictated with voice recognition software. Similar sounding words can inadvertently be transcribed and may not be  corrected upon review.

## 2022-08-31 NOTE — Progress Notes (Signed)
Patient seen by Dr. Mohamed  Vitals are within treatment parameters.  Labs reviewed: and are within treatment parameters.  Per physician team, patient is ready for treatment and there are NO modifications to the treatment plan.  

## 2022-08-31 NOTE — Patient Instructions (Signed)
[facility]  Discharge Instructions: Thank you for choosing [facility] to provide your oncology and hematology care.   If you have a lab appointment with the Cancer Center, please go directly to the Cancer Center and check in at the registration area.   Wear comfortable clothing and clothing appropriate for easy access to any Portacath or PICC line.   We strive to give you quality time with your provider. You may need to reschedule your appointment if you arrive late (15 or more minutes).  Arriving late affects you and other patients whose appointments are after yours.  Also, if you miss three or more appointments without notifying the office, you may be dismissed from the clinic at the provider's discretion.      For prescription refill requests, have your pharmacy contact our office and allow 72 hours for refills to be completed.    Today you received the following chemotherapy and/or immunotherapy agents Taxol      To help prevent nausea and vomiting after your treatment, we encourage you to take your nausea medication as directed.  BELOW ARE SYMPTOMS THAT SHOULD BE REPORTED IMMEDIATELY: *FEVER GREATER THAN 100.4 F (38 C) OR HIGHER *CHILLS OR SWEATING *NAUSEA AND VOMITING THAT IS NOT CONTROLLED WITH YOUR NAUSEA MEDICATION *UNUSUAL SHORTNESS OF BREATH *UNUSUAL BRUISING OR BLEEDING *URINARY PROBLEMS (pain or burning when urinating, or frequent urination) *BOWEL PROBLEMS (unusual diarrhea, constipation, pain near the anus) TENDERNESS IN MOUTH AND THROAT WITH OR WITHOUT PRESENCE OF ULCERS (sore throat, sores in mouth, or a toothache) UNUSUAL RASH, SWELLING OR PAIN  UNUSUAL VAGINAL DISCHARGE OR ITCHING   Items with * indicate a potential emergency and should be followed up as soon as possible or go to the Emergency Department if any problems should occur.  Please show the CHEMOTHERAPY ALERT CARD or IMMUNOTHERAPY ALERT CARD at check-in  to the Emergency Department and triage nurse.  Should you have questions after your visit or need to cancel or reschedule your appointment, please contact  [facility]  Dept: [phone]  and follow the prompts.  Office hours are 8:00 a.m. to 4:30 p.m. Monday - Friday. Please note that voicemails left after 4:00 p.m. may not be returned until the following business day.  We are closed weekends and major holidays. You have access to a nurse at all times for urgent questions. Please call the main number to the clinic Dept: [phone] and follow the prompts.   For any non-urgent questions, you may also contact your provider using MyChart. We now offer e-Visits for anyone 18 and older to request care online for non-urgent symptoms. For details visit [email or web address].   Also download the MyChart app! Go to the app store, search "MyChart", open the app, select , and log in with your MyChart username and password.   

## 2022-08-31 NOTE — Progress Notes (Signed)
Patient seen by Dr. Gypsy Balsam are not all within treatment parameters. Per Dr. Arbutus Ped , it is ok to treat pt today with  taxol and heart rate of 117  Labs reviewed: and are within treatment parameters.  Per physician team, patient is ready for treatment and there are NO modifications to the treatment plan.

## 2022-09-02 ENCOUNTER — Other Ambulatory Visit: Payer: Self-pay

## 2022-09-02 ENCOUNTER — Telehealth: Payer: Self-pay | Admitting: Internal Medicine

## 2022-09-02 ENCOUNTER — Inpatient Hospital Stay: Payer: Managed Care, Other (non HMO)

## 2022-09-02 VITALS — BP 113/75 | HR 108 | Temp 98.2°F | Resp 20

## 2022-09-02 DIAGNOSIS — C3491 Malignant neoplasm of unspecified part of right bronchus or lung: Secondary | ICD-10-CM

## 2022-09-02 DIAGNOSIS — Z5111 Encounter for antineoplastic chemotherapy: Secondary | ICD-10-CM | POA: Diagnosis not present

## 2022-09-02 MED ORDER — PEGFILGRASTIM INJECTION 6 MG/0.6ML ~~LOC~~
6.0000 mg | PREFILLED_SYRINGE | Freq: Once | SUBCUTANEOUS | Status: AC
  Administered 2022-09-02: 6 mg via SUBCUTANEOUS
  Filled 2022-09-02: qty 0.6

## 2022-09-02 NOTE — Telephone Encounter (Signed)
Scheduled per 04/23 los, patient has been called and voicemail was left. 

## 2022-09-05 ENCOUNTER — Other Ambulatory Visit: Payer: Self-pay | Admitting: Nurse Practitioner

## 2022-09-05 DIAGNOSIS — G893 Neoplasm related pain (acute) (chronic): Secondary | ICD-10-CM

## 2022-09-05 DIAGNOSIS — K5903 Drug induced constipation: Secondary | ICD-10-CM

## 2022-09-05 DIAGNOSIS — M792 Neuralgia and neuritis, unspecified: Secondary | ICD-10-CM

## 2022-09-05 DIAGNOSIS — C3491 Malignant neoplasm of unspecified part of right bronchus or lung: Secondary | ICD-10-CM

## 2022-09-05 DIAGNOSIS — F419 Anxiety disorder, unspecified: Secondary | ICD-10-CM

## 2022-09-05 DIAGNOSIS — Z515 Encounter for palliative care: Secondary | ICD-10-CM

## 2022-09-05 MED ORDER — GABAPENTIN 300 MG PO CAPS
ORAL_CAPSULE | ORAL | 2 refills | Status: DC
Start: 2022-09-05 — End: 2022-09-27

## 2022-09-05 MED ORDER — FUROSEMIDE 20 MG PO TABS
ORAL_TABLET | ORAL | 3 refills | Status: DC
Start: 2022-09-05 — End: 2023-01-20

## 2022-09-05 MED ORDER — MORPHINE SULFATE ER 30 MG PO TBCR
30.0000 mg | EXTENDED_RELEASE_TABLET | Freq: Three times a day (TID) | ORAL | 0 refills | Status: DC
Start: 2022-09-05 — End: 2022-09-27

## 2022-09-05 MED ORDER — POLYETHYLENE GLYCOL 3350 17 GM/SCOOP PO POWD
ORAL | 3 refills | Status: DC
Start: 2022-09-05 — End: 2022-09-27

## 2022-09-05 MED ORDER — MORPHINE SULFATE 15 MG PO TABS
15.0000 mg | ORAL_TABLET | ORAL | 0 refills | Status: DC | PRN
Start: 2022-09-05 — End: 2022-09-27

## 2022-09-05 NOTE — Telephone Encounter (Signed)
From: Aggie Moats To: Mayra Reel, NP Sent: 09/05/2022 2:22 PM EDT Subject: Medication Renewal Request  Refills have been requested for the following medications:   gabapentin (NEURONTIN) 300 MG capsule [Elric Tirado [facility]]   polyethylene glycol powder (MIRALAX) 17 GM/SCOOP powder [Alexios Keown [facility]]   morphine (MSIR) 15 MG tablet [Payge Eppes [facility]]   furosemide (LASIX) 20 MG tablet Jake Samples [facility]]   diazepam (VALIUM) 2 MG tablet Jake Samples [facility]]  Preferred pharmacy: [facility] [phone] - [place], [place] - [address] AT [facility] [address] Delivery method: Pickup

## 2022-09-23 ENCOUNTER — Encounter (HOSPITAL_COMMUNITY): Payer: Self-pay

## 2022-09-23 ENCOUNTER — Inpatient Hospital Stay: Payer: Managed Care, Other (non HMO) | Attending: Physician Assistant

## 2022-09-23 ENCOUNTER — Ambulatory Visit (HOSPITAL_COMMUNITY)
Admission: RE | Admit: 2022-09-23 | Discharge: 2022-09-23 | Disposition: A | Discharge status: Home / Self Care | Payer: Managed Care, Other (non HMO) | Source: Ambulatory Visit | Attending: Internal Medicine | Admitting: Internal Medicine

## 2022-09-23 ENCOUNTER — Other Ambulatory Visit: Payer: Self-pay

## 2022-09-23 DIAGNOSIS — G893 Neoplasm related pain (acute) (chronic): Secondary | ICD-10-CM | POA: Insufficient documentation

## 2022-09-23 DIAGNOSIS — C3490 Malignant neoplasm of unspecified part of unspecified bronchus or lung: Secondary | ICD-10-CM | POA: Diagnosis present

## 2022-09-23 DIAGNOSIS — F419 Anxiety disorder, unspecified: Secondary | ICD-10-CM | POA: Insufficient documentation

## 2022-09-23 DIAGNOSIS — C3431 Malignant neoplasm of lower lobe, right bronchus or lung: Secondary | ICD-10-CM | POA: Insufficient documentation

## 2022-09-23 DIAGNOSIS — C3491 Malignant neoplasm of unspecified part of right bronchus or lung: Secondary | ICD-10-CM

## 2022-09-23 LAB — CMP (CANCER CENTER ONLY)
ALT: 15 U/L (ref 0–44)
AST: 15 U/L (ref 15–41)
Albumin: 3.6 g/dL (ref 3.5–5.0)
Alkaline Phosphatase: 70 U/L (ref 38–126)
Anion gap: 7 (ref 5–15)
BUN: 13 mg/dL (ref 8–23)
CO2: 32 mmol/L (ref 22–32)
Calcium: 9.2 mg/dL (ref 8.9–10.3)
Chloride: 99 mmol/L (ref 98–111)
Creatinine: 0.76 mg/dL (ref 0.61–1.24)
GFR, Estimated: >60 mL/min (ref >60)
Glucose, Bld: 100 mg/dL — ABNORMAL HIGH (ref 70–99)
Potassium: 3.6 mmol/L (ref 3.5–5.1)
Sodium: 138 mmol/L (ref 135–145)
Total Bilirubin: 0.5 mg/dL (ref 0.3–1.2)
Total Protein: 7.2 g/dL (ref 6.5–8.1)

## 2022-09-23 LAB — CBC WITH DIFFERENTIAL (CANCER CENTER ONLY)
Abs Immature Granulocytes: 0.02 10*3/uL (ref 0.00–0.07)
Basophils Absolute: 0.1 10*3/uL (ref 0.0–0.1)
Basophils Relative: 1 %
Eosinophils Absolute: 0.1 10*3/uL (ref 0.0–0.5)
Eosinophils Relative: 2 %
HCT: 31.2 % — ABNORMAL LOW (ref 39.0–52.0)
Hemoglobin: 10.7 g/dL — ABNORMAL LOW (ref 13.0–17.0)
Immature Granulocytes: 0 %
Lymphocytes Relative: 20 %
Lymphs Abs: 1.3 10*3/uL (ref 0.7–4.0)
MCH: 31.7 pg (ref 26.0–34.0)
MCHC: 34.3 g/dL (ref 30.0–36.0)
MCV: 92.3 fL (ref 80.0–100.0)
Monocytes Absolute: 1.1 10*3/uL — ABNORMAL HIGH (ref 0.1–1.0)
Monocytes Relative: 18 %
Neutro Abs: 3.8 10*3/uL (ref 1.7–7.7)
Neutrophils Relative %: 59 %
Platelet Count: 248 10*3/uL (ref 150–400)
RBC: 3.38 MIL/uL — ABNORMAL LOW (ref 4.22–5.81)
RDW: 13.9 % (ref 11.5–15.5)
WBC Count: 6.3 10*3/uL (ref 4.0–10.5)
nRBC: 0 % (ref 0.0–0.2)

## 2022-09-23 MED ORDER — IOHEXOL 300 MG/ML  SOLN
100.0000 mL | Freq: Once | INTRAMUSCULAR | Status: AC | PRN
  Administered 2022-09-23: 100 mL via INTRAVENOUS

## 2022-09-23 NOTE — Progress Notes (Unsigned)
Palliative Medicine [facility]  [phone] [phone]) [phone]   Name: Paul Mclean Date: 09/23/2022 MRN: [id number]  DOB: December 30, 1960  Patient Care Team: [name], MD as PCP - General (Internal Medicine) [facility], Arty Baumgartner, NP as Nurse Practitioner (Nurse Practitioner)    INTERVAL HISTORY: Paul Mclean is a 62 y.o. 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  Palliative ask to see for pain management and goals of care.  SOCIAL HISTORY:    [name] reports that he quit smoking about 16 years ago. His smoking use included cigarettes. He has a 60.00 pack-year smoking history. He has never used smokeless tobacco. He reports current alcohol use of about 1.0 - 2.0 standard drink of alcohol per week. He reports that he does not use drugs.  ADVANCE DIRECTIVES:  Advanced directives on file  CODE STATUS: Full code  PAST MEDICAL HISTORY: Past Medical History:  Diagnosis Date   Atrial flutter (HCC)    question of brief aflutter 02/28/22 in setting of acute pericarditis (not captured on ECG)   Cancer (HCC)    right lung cancer   DVT (deep venous thrombosis) (HCC) 02/27/2021   LLE partially occlusive DVT 02/25/21, started on Xarelto   Dyspnea    due to cancer per pt   History of radiation therapy 11/12/2020   right lung  09/30/2020-11/12/2020  Dr Antony Blackbird   Hypothyroidism    Thyroid disease     ALLERGIES:  is allergic to carboplatin.  MEDICATIONS:  Current Outpatient Medications  Medication Sig Dispense Refill   albuterol (VENTOLIN HFA) 108 (90 Base) MCG/ACT inhaler Inhale 2 puffs into the lungs every 6 (six) hours as needed for wheezing or shortness of breath. 8 g 2   Budeson-Glycopyrrol-Formoterol (BREZTRI AEROSPHERE) 160-9-4.8 MCG/ACT AERO Inhale 2 puffs into the lungs in the morning and at bedtime. 10.7 g 11   diazepam  (VALIUM) 2 MG tablet Take 1 tablet (2 mg total) by mouth every 12 (twelve) hours as needed for anxiety. 45 tablet 0   furosemide (LASIX) 20 MG tablet TAKE 1 TABLET BY MOUTH DAILY AS NEEDED FOR SWELLING 20 tablet 3   gabapentin (NEURONTIN) 300 MG capsule Take 1 capsule in the morning and 3 capsules at bedtime. 120 capsule 2   ibuprofen (ADVIL) 200 MG tablet Take 200 mg by mouth in the morning and at bedtime.     ipratropium-albuterol (DUONEB) 0.5-2.5 (3) MG/3ML SOLN Inhale 3 mLs into the lungs every 6 (six) hours as needed (wheezing/shortness of breath).     levothyroxine (SYNTHROID) 112 MCG tablet Take 112 mcg by mouth daily before breakfast.     morphine (MS CONTIN) 30 MG 12 hr tablet Take 1 tablet (30 mg total) by mouth every 8 (eight) hours. 90 tablet 0   morphine (MSIR) 15 MG tablet Take 1 tablet (15 mg total) by mouth every 4 (four) hours as needed for moderate pain or severe pain (pain.). 90 tablet 0   ondansetron (ZOFRAN) 8 MG tablet Take 1 tablet (8 mg total) by mouth every 8 (eight) hours as needed for nausea or vomiting. May take starting on day 3 following chemotherapy 30 tablet 1   pantoprazole (PROTONIX) 40 MG tablet Take 1 tablet (40 mg total) by mouth daily. 30 tablet 2   polyethylene glycol powder (MIRALAX) 17 GM/SCOOP powder Take 1 scoop (17GM) once daily. 255 g  3   potassium chloride (KLOR-CON M) 10 MEQ tablet Take 1 tablet (10 mEq total) by mouth daily. 5 tablet 0   predniSONE (DELTASONE) 10 MG tablet Take 5 mg by mouth in the morning.     XARELTO 20 MG TABS tablet TAKE 1 TABLET(20 MG) BY MOUTH DAILY WITH SUPPER (Patient taking differently: Take 20 mg by mouth in the morning.) 30 tablet 3   No current facility-administered medications for this visit.    VITAL SIGNS: There were no vitals taken for this visit. There were no vitals filed for this visit.   Estimated body mass index is 29.43 kg/m as calculated from the following:   Height as of 08/31/22: 5\' 9"  (1.753 m).    Weight as of 08/31/22: 199 lb 4.8 oz (90.4 kg).   PERFORMANCE STATUS (ECOG) : 1 - Symptomatic but completely ambulatory   Physical Exam General: NAD Cardiovascular: regular rate and rhythm Pulmonary: normal breathing pattern, lung sounds diminished right > left Abdomen: soft, nontender, + bowel sounds Extremities: no edema, no joint deformities Skin: no rashes Neurological: AAO x 4, slowed responses  IMPRESSION:   Neoplasm related pain [name] reports that his pain is managed with current medication regimen.   We discussed his current regimen: MS Contin every 8 hours, MS IR 15 mg as needed for breakthrough pain (requiring this about once per day), and Gabapentin 300 mg in the morning, 900 mg at bedtime. Is also using Claritin before chemotherapy treatments.  2.   Anxiety    3. Decreased appetite/weight loss  We discussed the importance of continued conversation with family and their medical providers regarding overall plan of care and treatment options, ensuring decisions are within the context of the patients values and GOCs.  PLAN:  MS Contin 30 mg three times daily. MS IR 15mg  every 4-6 hours as needed for breakthrough pain. Taking about once per day. Gabapentin 600 mg daily and 900 mg at bedtime. Increase Valium to 2 mg BID as needed for anxiety. Miralax daily for bowel regimen. Instructed family to reach out to Oncology for their question regarding the possibility of patient receiving vitamin infusions. We will continue to closely monitor and assist with symptom management.  Palliative team will plan to see patient back in 3-4 weeks in collaboration to other oncology appointments. Patient and family knows to contact office if needed sooner.    Patient expressed understanding and was in agreement with this plan. He also understands that He can call the clinic at any time with any questions, concerns, or complaints.   Any controlled substances utilized were prescribed  in the context of palliative care. PDMP has been reviewed.     Visit consisted of counseling and education dealing with the complex and emotionally intense issues of symptom management and palliative care in the setting of serious and potentially life-threatening illness.Greater than 50%  of this time was spent counseling and coordinating care related to the above assessment and plan.   Willette Alma, AGPCNP-BC  Palliative Medicine Team/[facility]  *Please note that this is a verbal dictation therefore any spelling or grammatical errors are due to the "[employer] One" system interpretation.

## 2022-09-24 NOTE — Progress Notes (Unsigned)
[facility] OFFICE PROGRESS NOTE  Paul Perches, MD [address] [place] [address]  DIAGNOSIS:  1) recurrent non-small cell lung cancer, squamous cell carcinoma initially diagnosed as stage IIIb (T3, N2, M0) non-small cell lung cancer, squamous cell carcinoma presented with large right upper lobe perihilar mass with right hilar and subcarinal lymphadenopathy diagnosed in April 2022. 2) left lower extremity deep venous thrombosis diagnosed in October 2022.  Currently on treatment with Xarelto  PRIOR THERAPY: 1) Concurrent chemoradiation with weekly carboplatin for AUC of 2 and paclitaxel 45 Mg/M2.  Status post 8 cycles.  Last dose was given November 10, 2020. 2) Consolidation treatment with immunotherapy with Imfinzi 1500 Mg IV every 4 weeks.  First dose December 14, 2020.  Status post 3 cycles.  This was discontinued secondary to intolerance with highly suspicious immunotherapy mediated pneumonitis. 3) Systemic chemotherapy with carboplatin for AUC of 5 and paclitaxel 175 Mg/M2 with Neulasta support every 3 weeks. Status post 6 cycles. Carboplatin discontinued starting from cycle #4 due to infusion reaction.   CURRENT THERAPY: Observation   INTERVAL HISTORY: Paul Mclean 62 y.o. male returns to the clinic today for a follow-up visit accompanied by his daughter and wife. The patient recently underwent chemotherapy which he tolerated fair but had a lot of fatigue. His fatigue is improving. His neuropathy is stable. He still able to perform fine motor functions. He denies any fever or chills. The patient is followed closely by palliative care for his rib pain   Her pain is better controlled at this time. He has supplemental oxygen PRN. Overall, he feels like his dyspnea on exertion is a little better than prior.  Denies any significant cough.  Denies any nausea or vomiting. Denies any recent diarrhea or constipation.  Denies any headache or visual changes. The patient recently had a  restaging CT scan. He is here today for evaluation and to review his scan results before discussing the next steps.   MEDICAL HISTORY: Past Medical History:  Diagnosis Date   Atrial flutter (HCC)    question of brief aflutter 02/28/22 in setting of acute pericarditis (not captured on ECG)   Cancer (HCC)    right lung cancer   DVT (deep venous thrombosis) (HCC) 02/27/2021   LLE partially occlusive DVT 02/25/21, started on Xarelto   Dyspnea    due to cancer per pt   History of radiation therapy 11/12/2020   right lung  09/30/2020-11/12/2020  Dr Antony Blackbird   Hypothyroidism    Thyroid disease     ALLERGIES:  is allergic to carboplatin.  MEDICATIONS:  Current Outpatient Medications  Medication Sig Dispense Refill   albuterol (VENTOLIN HFA) 108 (90 Base) MCG/ACT inhaler Inhale 2 puffs into the lungs every 6 (six) hours as needed for wheezing or shortness of breath. 8 g 2   Budeson-Glycopyrrol-Formoterol (BREZTRI AEROSPHERE) 160-9-4.8 MCG/ACT AERO Inhale 2 puffs into the lungs in the morning and at bedtime. 10.7 g 11   diazepam (VALIUM) 2 MG tablet Take 1 tablet (2 mg total) by mouth every 12 (twelve) hours as needed for anxiety. 45 tablet 0   furosemide (LASIX) 20 MG tablet TAKE 1 TABLET BY MOUTH DAILY AS NEEDED FOR SWELLING 20 tablet 3   gabapentin (NEURONTIN) 300 MG capsule Take 1 capsule in the morning and 3 capsules at bedtime. 120 capsule 2   ibuprofen (ADVIL) 200 MG tablet Take 200 mg by mouth in the morning and at bedtime.     ipratropium-albuterol (DUONEB)  0.5-2.5 (3) MG/3ML SOLN Inhale 3 mLs into the lungs every 6 (six) hours as needed (wheezing/shortness of breath).     levothyroxine (SYNTHROID) 112 MCG tablet Take 112 mcg by mouth daily before breakfast.     morphine (MS CONTIN) 30 MG 12 hr tablet Take 1 tablet (30 mg total) by mouth every 8 (eight) hours. 90 tablet 0   morphine (MSIR) 15 MG tablet Take 1 tablet (15 mg total) by mouth every 4 (four) hours as needed for moderate  pain or severe pain (pain.). 90 tablet 0   ondansetron (ZOFRAN) 8 MG tablet Take 1 tablet (8 mg total) by mouth every 8 (eight) hours as needed for nausea or vomiting. May take starting on day 3 following chemotherapy 30 tablet 1   pantoprazole (PROTONIX) 40 MG tablet Take 1 tablet (40 mg total) by mouth daily. 30 tablet 2   polyethylene glycol powder (MIRALAX) 17 GM/SCOOP powder Take 1 scoop (17GM) once daily. 255 g 3   potassium chloride (KLOR-CON M) 10 MEQ tablet Take 1 tablet (10 mEq total) by mouth daily. 5 tablet 0   predniSONE (DELTASONE) 10 MG tablet Take 5 mg by mouth in the morning.     XARELTO 20 MG TABS tablet TAKE 1 TABLET(20 MG) BY MOUTH DAILY WITH SUPPER (Patient taking differently: Take 20 mg by mouth in the morning.) 30 tablet 3   No current facility-administered medications for this visit.    SURGICAL HISTORY:  Past Surgical History:  Procedure Laterality Date   APPENDECTOMY     BRONCHIAL BIOPSY  09/01/2020   Procedure: BRONCHIAL BIOPSIES;  Surgeon: Josephine Igo, DO;  Location: [facility];  Service: Pulmonary;;   BRONCHIAL BIOPSY  04/05/2022   Procedure: BRONCHIAL BIOPSIES;  Surgeon: Josephine Igo, DO;  Location: [facility];  Service: Pulmonary;;   BRONCHIAL BRUSHINGS  09/01/2020   Procedure: BRONCHIAL BRUSHINGS;  Surgeon: Josephine Igo, DO;  Location: [facility];  Service: Pulmonary;;   BRONCHIAL NEEDLE ASPIRATION BIOPSY  09/01/2020   Procedure: BRONCHIAL NEEDLE ASPIRATION BIOPSIES;  Surgeon: Josephine Igo, DO;  Location: [facility];  Service: Pulmonary;;   BRONCHIAL WASHINGS  09/01/2020   Procedure: BRONCHIAL WASHINGS;  Surgeon: Josephine Igo, DO;  Location: [facility];  Service: Pulmonary;;   FINE NEEDLE ASPIRATION  04/05/2022   Procedure: FINE NEEDLE ASPIRATION (FNA) LINEAR;  Surgeon: Josephine Igo, DO;  Location: [facility];  Service: Pulmonary;;   HERNIA REPAIR     INCISION AND DRAINAGE ABSCESS N/A 08/25/2017   Procedure: INCISION AND  DRAINAGE perineal  ABSCESS;  Surgeon: Jerilee Field, MD;  Location: [facility];  Service: Urology;  Laterality: N/A;   VIDEO BRONCHOSCOPY WITH ENDOBRONCHIAL ULTRASOUND N/A 09/01/2020   Procedure: VIDEO BRONCHOSCOPY WITH ENDOBRONCHIAL ULTRASOUND;  Surgeon: Josephine Igo, DO;  Location: [facility];  Service: Pulmonary;  Laterality: N/A;   VIDEO BRONCHOSCOPY WITH ENDOBRONCHIAL ULTRASOUND Right 04/05/2022   Procedure: VIDEO BRONCHOSCOPY WITH ENDOBRONCHIAL ULTRASOUND;  Surgeon: Josephine Igo, DO;  Location: [facility];  Service: Pulmonary;  Laterality: Right;    REVIEW OF SYSTEMS:   Review of Systems  Constitutional: Positive for improving fatigue. Negative for appetite change, chills, fever and unexpected weight change.  HENT: Negative for mouth sores, nosebleeds, sore throat and trouble swallowing.   Eyes: Negative for eye problems and icterus.  Respiratory: Improving but persistent dyspnea on exertion. Negative for cough, hemoptysis, and wheezing.   Cardiovascular: Negative for chest pain and leg swelling.  Gastrointestinal: Negative for abdominal pain, constipation, diarrhea,  nausea and vomiting.  Genitourinary: Negative for bladder incontinence, difficulty urinating, dysuria, frequency and hematuria.   Musculoskeletal: Negative for back pain, gait problem, neck pain and neck stiffness.  Skin: Negative for itching and rash.  Neurological: Positive for peripheral neuropathy. Negative for dizziness, extremity weakness, gait problem, headaches, light-headedness and seizures.  Hematological: Negative for adenopathy. Does not bruise/bleed easily.  Psychiatric/Behavioral: Negative for confusion, depression and sleep disturbance. The patient is not nervous/anxious.     PHYSICAL EXAMINATION:  Blood pressure 122/72, pulse (!) 107, temperature 97.7 F (36.5 C), temperature source Temporal, resp. rate 17, height 5\' 9"  (1.753 m), weight 203 lb 12.8 oz (92.4 kg), SpO2 94 %.  ECOG PERFORMANCE  STATUS: 1  Physical Exam  Constitutional: Oriented to person, place, and time and well-developed, well-nourished, and in no distress.  HENT:  Head: Normocephalic and atraumatic.  Mouth/Throat: Oropharynx is clear and moist. No oropharyngeal exudate.  Eyes: Conjunctivae are normal. Right eye exhibits no discharge. Left eye exhibits no discharge. No scleral icterus.  Neck: Normal range of motion. Neck supple.  Cardiovascular: Normal rate, regular rhythm, normal heart sounds and intact distal pulses.   Pulmonary/Chest: Effort normal. Quiet breath sounds bilaterally.  No respiratory distress. No wheezes. No rales.   Abdominal: Soft. Bowel sounds are normal. Exhibits no distension and no mass. There is no tenderness.  Musculoskeletal: Normal range of motion. Exhibits no edema.  Lymphadenopathy:    No cervical adenopathy.  Neurological: Alert and oriented to person, place, and time. Exhibits normal muscle tone. Gait normal. Coordination normal.  Skin: Skin is warm and dry. No rash noted. Not diaphoretic. No erythema. No pallor.  Psychiatric: Mood, memory and judgment normal.  Vitals reviewed  LABORATORY DATA: Lab Results  Component Value Date   WBC 6.3 09/23/2022   HGB 10.7 (L) 09/23/2022   HCT 31.2 (L) 09/23/2022   MCV 92.3 09/23/2022   PLT 248 09/23/2022      Chemistry      Component Value Date/Time   NA 138 09/23/2022 1109   K 3.6 09/23/2022 1109   CL 99 09/23/2022 1109   CO2 32 09/23/2022 1109   BUN 13 09/23/2022 1109   CREATININE 0.76 09/23/2022 1109      Component Value Date/Time   CALCIUM 9.2 09/23/2022 1109   ALKPHOS 70 09/23/2022 1109   AST 15 09/23/2022 1109   ALT 15 09/23/2022 1109   BILITOT 0.5 09/23/2022 1109       RADIOGRAPHIC STUDIES:  CT Chest W Contrast  Result Date: 09/26/2022 CLINICAL DATA:  Staging non-small-cell lung cancer. Chemotherapy completion 1 week ago. * Tracking Code: BO * EXAM: CT CHEST, ABDOMEN, AND PELVIS WITH CONTRAST TECHNIQUE:  Multidetector CT imaging of the chest, abdomen and pelvis was performed following the standard protocol during bolus administration of intravenous contrast. RADIATION DOSE REDUCTION: This exam was performed according to the departmental dose-optimization program which includes automated exposure control, adjustment of the mA and/or kV according to patient size and/or use of iterative reconstruction technique. CONTRAST:  OMNIPAQUE IOHEXOL 300 MG/ML  SOLN COMPARISON:  CT scan abdomen 03/16/2022. Chest 07/19/2022 and older. PET-CT 04/20/2022 FINDINGS: CT CHEST FINDINGS Cardiovascular: Thoracic aorta overall has a normal course and caliber with some mild atherosclerotic plaque. There is a bovine type aortic arch, a normal variant. Trace pericardial fluid. Heart is nonenlarged. Confluence mass in the right upper thorax does extend along the mediastinum and extends along the course of the right upper lobe pulmonary artery with some narrowing. Mediastinum/Nodes: Small thyroid  gland. Normal caliber thoracic esophagus. No specific abnormal lymph node enlargement identified in the axillary region, hilum on the left or mediastinum. 7 mm subcarinal node on the previous exam is stable today on series 2, image 29. Lungs/Pleura: Centrilobular emphysematous lung changes are identified once again. Left lung is without consolidation, pneumothorax or effusion. There is some new mild nonspecific apical ground-glass such as axial image 27. The right lung once again has a large confluence area of opacity with distortion of bronchi in the medial right upper lobe extending down along the interlobar fissures and extending back to the hilum with a encasement of vessels and narrowing of bronchi. The extent and distribution is similar. The prior study had provided measurement 5.2 x 3.5 cm. Attempting to measure this in the same fashion for continuity on soft tissue windows the area would have measured today 5.7 by 4.5 cm. Areas of  right-sided pleural thickening identified. No new mass lesion. Musculoskeletal: Scattered degenerative changes along the spine. There is a focal lipoma along the right lateral chest wall on image 58 of series 2. CT ABDOMEN PELVIS FINDINGS Hepatobiliary: Fatty liver infiltration identified. No space-occupying liver lesion. The gallbladder is present. Patent portal vein. Pancreas: Unremarkable. No pancreatic ductal dilatation or surrounding inflammatory changes. Spleen: Normal in size without focal abnormality. Adrenals/Urinary Tract: The adrenal glands are preserved. There is a tiny low-attenuation lesion along the upper pole of the right kidney which is too small to completely characterize although likely a benign cyst no specific imaging follow-up. Bosniak 2 lesion. The ureters have normal course and caliber down to the bladder. Slight wall thickening of the urinary bladder. Stomach/Bowel: Diffuse colonic stool. Small and large bowel are nondilated. Nonspecific distal small bowel stool appearance. The stomach is collapsed. Vascular/Lymphatic: Aortic atherosclerosis. No enlarged abdominal or pelvic lymph nodes. Reproductive: Prostate is unremarkable. Other: Small right inguinal fat containing hernia. Small umbilical hernia. Musculoskeletal: Scattered degenerative changes of the spine and pelvis. Focal hemangioma of the vertebral body of L5. multifocal disc bulging lumbar spine with some potential stenosis at L3-4. IMPRESSION: Essentially stable confluence mass along the medial right upper lobe abutting the mediastinum, pleura and extending back to the hilum with distortion of surrounding structures including bronchi and vasculature. Associated volume loss of the right hemithorax. No new developing mass lesion, fluid collection or lymph node enlargement. New areas of mild ground-glass along the left upper lobe, nonspecific. Recommend attention on follow-up. Fatty liver infiltration. Electronically Signed   By: Karen Kays M.D.   On: 09/26/2022 16:08   CT Abdomen Pelvis W Contrast  Result Date: 09/26/2022 CLINICAL DATA:  Staging non-small-cell lung cancer. Chemotherapy completion 1 week ago. * Tracking Code: BO * EXAM: CT CHEST, ABDOMEN, AND PELVIS WITH CONTRAST TECHNIQUE: Multidetector CT imaging of the chest, abdomen and pelvis was performed following the standard protocol during bolus administration of intravenous contrast. RADIATION DOSE REDUCTION: This exam was performed according to the departmental dose-optimization program which includes automated exposure control, adjustment of the mA and/or kV according to patient size and/or use of iterative reconstruction technique. CONTRAST:  OMNIPAQUE IOHEXOL 300 MG/ML  SOLN COMPARISON:  CT scan abdomen 03/16/2022. Chest 07/19/2022 and older. PET-CT 04/20/2022 FINDINGS: CT CHEST FINDINGS Cardiovascular: Thoracic aorta overall has a normal course and caliber with some mild atherosclerotic plaque. There is a bovine type aortic arch, a normal variant. Trace pericardial fluid. Heart is nonenlarged. Confluence mass in the right upper thorax does extend along the mediastinum and extends along the course of  the right upper lobe pulmonary artery with some narrowing. Mediastinum/Nodes: Small thyroid gland. Normal caliber thoracic esophagus. No specific abnormal lymph node enlargement identified in the axillary region, hilum on the left or mediastinum. 7 mm subcarinal node on the previous exam is stable today on series 2, image 29. Lungs/Pleura: Centrilobular emphysematous lung changes are identified once again. Left lung is without consolidation, pneumothorax or effusion. There is some new mild nonspecific apical ground-glass such as axial image 27. The right lung once again has a large confluence area of opacity with distortion of bronchi in the medial right upper lobe extending down along the interlobar fissures and extending back to the hilum with a encasement of vessels and  narrowing of bronchi. The extent and distribution is similar. The prior study had provided measurement 5.2 x 3.5 cm. Attempting to measure this in the same fashion for continuity on soft tissue windows the area would have measured today 5.7 by 4.5 cm. Areas of right-sided pleural thickening identified. No new mass lesion. Musculoskeletal: Scattered degenerative changes along the spine. There is a focal lipoma along the right lateral chest wall on image 58 of series 2. CT ABDOMEN PELVIS FINDINGS Hepatobiliary: Fatty liver infiltration identified. No space-occupying liver lesion. The gallbladder is present. Patent portal vein. Pancreas: Unremarkable. No pancreatic ductal dilatation or surrounding inflammatory changes. Spleen: Normal in size without focal abnormality. Adrenals/Urinary Tract: The adrenal glands are preserved. There is a tiny low-attenuation lesion along the upper pole of the right kidney which is too small to completely characterize although likely a benign cyst no specific imaging follow-up. Bosniak 2 lesion. The ureters have normal course and caliber down to the bladder. Slight wall thickening of the urinary bladder. Stomach/Bowel: Diffuse colonic stool. Small and large bowel are nondilated. Nonspecific distal small bowel stool appearance. The stomach is collapsed. Vascular/Lymphatic: Aortic atherosclerosis. No enlarged abdominal or pelvic lymph nodes. Reproductive: Prostate is unremarkable. Other: Small right inguinal fat containing hernia. Small umbilical hernia. Musculoskeletal: Scattered degenerative changes of the spine and pelvis. Focal hemangioma of the vertebral body of L5. multifocal disc bulging lumbar spine with some potential stenosis at L3-4. IMPRESSION: Essentially stable confluence mass along the medial right upper lobe abutting the mediastinum, pleura and extending back to the hilum with distortion of surrounding structures including bronchi and vasculature. Associated volume loss of  the right hemithorax. No new developing mass lesion, fluid collection or lymph node enlargement. New areas of mild ground-glass along the left upper lobe, nonspecific. Recommend attention on follow-up. Fatty liver infiltration. Electronically Signed   By: Karen Kays M.D.   On: 09/26/2022 16:08     ASSESSMENT/PLAN:  This is a very pleasant 62 year old Caucasian male with recurrent lung cancer, initially diagnosed with stage IIIb (T3, N2, M0) non-small cell lung cancer, squamous cell carcinoma.  The patient presented with a large right upper lobe perihilar mass with right hilar and subcarinal lymphadenopathy.  The patient was diagnosed in April 2022.   The patient completed a course of concurrent chemoradiation with weekly carboplatin and paclitaxel.  The patient is currently status post 8 cycles. His last dose was on 11/10/20.   He then was on immunotherapy with Imfinzi 1500 mg IV every 4 weeks.  He is status post 3 cycles.  It was discontinued secondary to concerns for pneumonitis.  He had been on observation until his PCP at [facility] ordered a CT scan which showed consolidation in the right hilum concerning for a lung mass.   Dr. Tonia Brooms arrange for  a bronchoscopy on 04/05/2022 and the final pathology was consistent with squamous cell carcinoma. The patient also had a PET scan and MRI performed recently. His scan showed worsening and progressive hypermetabolic activity in the right upper lobe/hilar mass concerning for progressive disease locally.    He was seen by Dr. Roselind Messier for consideration of palliative radiotherapy to the recurrent disease in the right lung but Dr. Roselind Messier did not feel that he can offer the patient any additional radiation because of the high-dose he received in the past. The patient started systemic chemotherapy with carboplatin for AUC of 5 and paclitaxel 175 Mg/M2 IV every 3 weeks with Neulasta support last week.  Status post 6 cycles. The patient has significant  reaction to carboplatin with cycle #3 with diaphoresis, color change, shortness of breath, and tachycardia.  Carboplatin was discontinued from there from his care plan.   The patient was seen with Dr. Arbutus Ped today. The patient recently had a restaging CT scan performed.  Dr. Arbutus Ped personally and independently reviewed the scan and discussed the results with the patient today.  The scan showed essentially stable confluence mass along the medial mass along the RUL.   The patient's family is concerned because the radiologist measured this larger when compared to his last scan. Dr. Arbutus Ped will order a PET scan to further evaluate this.   We will see him back in 3 weeks to review the results. If stable, Dr. Arbutus Ped would recommend 3 month break. If disease progression, then we would have to discuss alternative therapies which likely will have more side effects than his most recent chemotherapy which he had a lot of fatigue and neuropathy.  He will continue taking gabapentin for his peripheral neuropathy.  He is scheduled to see palliative care today. I reviewed that there is room to titrate up his gabapentin if needed. However, gabapentin can increase drowsiness so if he has intolerance, to lower the dose.    He will continue taking Xarelto for his history of pulmonary embolism.   The patient was advised to call immediately if he has any concerning symptoms in the interval. The patient voices understanding of current disease status and treatment options and is in agreement with the current care plan. All questions were answered. The patient knows to call the clinic with any problems, questions or concerns. We can certainly see the patient much sooner if necessary       Orders Placed This Encounter  Procedures   NM PET Image Initial (PI) Skull Base To Thigh    Standing Status:   Future    Standing Expiration Date:   09/27/2023    Order Specific Question:   If indicated for the ordered  procedure, I authorize the administration of a radiopharmaceutical per Radiology protocol    Answer:   Yes    Order Specific Question:   Preferred imaging location?    Answer:   [facility]      Paul Eudy L Shilo Philipson, PA-C 09/27/22  ADDENDUM: Hematology/Oncology Attending: I had a face-to-face encounter with the patient today.  I reviewed his record, lab, scan and recommended his care plan.  This is a very pleasant 62 years old white male with recurrent non-small cell lung cancer, squamous cell carcinoma that was initially diagnosed as stage IIIb in April 2022 status post course of concurrent chemoradiation followed by 3 cycles of consolidation treatment with immunotherapy with Imfinzi discontinued secondary to suspicious immunotherapy mediated pneumonitis.  The patient had evidence for disease recurrence in December  2023.  He was treated with 6 cycles of systemic chemotherapy with carboplatin and paclitaxel.  He tolerated his treatment well except for the fatigue as well as the peripheral neuropathy.  His shortness of breath has improved on the treatment.  The patient had repeat CT scan of the chest, abdomen and pelvis performed recently.  I personally and independently reviewed the scan images and discussed the result and showed the images to the patient and his family.  His scan showed a stable disease but because of some descriptive text on the scan report with concern about increase in size of the consolidation in the right lung, the patient and his wife are very interested in having a PET scan for further evaluation of this lesion. We will order the PET scan to be done in the next 2-3 weeks and the patient will come back for follow-up visit after the scan. If there is no concerning finding for disease progression, the patient will continue on observation and close monitoring. He was advised to call immediately if he has any other concerning symptoms in the interval. The total time spent in  the appointment was 30 minutes. Disclaimer: This note was dictated with voice recognition software. Similar sounding words can inadvertently be transcribed and may be missed upon review. Lajuana Matte, MD

## 2022-09-27 ENCOUNTER — Encounter: Payer: Self-pay | Admitting: Nurse Practitioner

## 2022-09-27 ENCOUNTER — Other Ambulatory Visit: Payer: Self-pay

## 2022-09-27 ENCOUNTER — Inpatient Hospital Stay (HOSPITAL_BASED_OUTPATIENT_CLINIC_OR_DEPARTMENT_OTHER): Payer: Managed Care, Other (non HMO) | Admitting: Nurse Practitioner

## 2022-09-27 ENCOUNTER — Inpatient Hospital Stay: Payer: Managed Care, Other (non HMO) | Admitting: Physician Assistant

## 2022-09-27 VITALS — BP 122/72 | HR 107 | Temp 97.7°F | Resp 17 | Ht 69.0 in | Wt 203.8 lb

## 2022-09-27 DIAGNOSIS — K5903 Drug induced constipation: Secondary | ICD-10-CM

## 2022-09-27 DIAGNOSIS — C3491 Malignant neoplasm of unspecified part of right bronchus or lung: Secondary | ICD-10-CM | POA: Diagnosis not present

## 2022-09-27 DIAGNOSIS — F419 Anxiety disorder, unspecified: Secondary | ICD-10-CM | POA: Diagnosis not present

## 2022-09-27 DIAGNOSIS — M792 Neuralgia and neuritis, unspecified: Secondary | ICD-10-CM

## 2022-09-27 DIAGNOSIS — Z7189 Other specified counseling: Secondary | ICD-10-CM | POA: Diagnosis not present

## 2022-09-27 DIAGNOSIS — C3431 Malignant neoplasm of lower lobe, right bronchus or lung: Secondary | ICD-10-CM | POA: Diagnosis not present

## 2022-09-27 DIAGNOSIS — Z515 Encounter for palliative care: Secondary | ICD-10-CM | POA: Diagnosis not present

## 2022-09-27 DIAGNOSIS — G893 Neoplasm related pain (acute) (chronic): Secondary | ICD-10-CM

## 2022-09-27 MED ORDER — DIAZEPAM 2 MG PO TABS
2.0000 mg | ORAL_TABLET | Freq: Two times a day (BID) | ORAL | 0 refills | Status: DC | PRN
Start: 2022-09-27 — End: 2023-01-20

## 2022-09-27 MED ORDER — MORPHINE SULFATE 15 MG PO TABS
15.0000 mg | ORAL_TABLET | ORAL | 0 refills | Status: DC | PRN
Start: 2022-09-27 — End: 2023-01-20

## 2022-09-27 MED ORDER — POLYETHYLENE GLYCOL 3350 17 GM/SCOOP PO POWD
ORAL | 3 refills | Status: DC
Start: 2022-09-27 — End: 2023-01-20

## 2022-09-27 MED ORDER — MORPHINE SULFATE ER 30 MG PO TBCR
30.0000 mg | EXTENDED_RELEASE_TABLET | Freq: Three times a day (TID) | ORAL | 0 refills | Status: DC
Start: 2022-10-07 — End: 2022-10-17

## 2022-09-27 MED ORDER — GABAPENTIN 300 MG PO CAPS
ORAL_CAPSULE | ORAL | 1 refills | Status: DC
Start: 2022-09-27 — End: 2023-01-20

## 2022-10-17 ENCOUNTER — Other Ambulatory Visit: Payer: Self-pay | Admitting: Nurse Practitioner

## 2022-10-17 ENCOUNTER — Encounter (HOSPITAL_COMMUNITY)
Admission: RE | Admit: 2022-10-17 | Discharge: 2022-10-17 | Disposition: A | Discharge status: Home / Self Care | Payer: Managed Care, Other (non HMO) | Source: Ambulatory Visit | Attending: Physician Assistant | Admitting: Physician Assistant

## 2022-10-17 DIAGNOSIS — C3491 Malignant neoplasm of unspecified part of right bronchus or lung: Secondary | ICD-10-CM | POA: Diagnosis not present

## 2022-10-17 DIAGNOSIS — G893 Neoplasm related pain (acute) (chronic): Secondary | ICD-10-CM

## 2022-10-17 DIAGNOSIS — Z515 Encounter for palliative care: Secondary | ICD-10-CM

## 2022-10-17 LAB — GLUCOSE, CAPILLARY: Glucose-Capillary: 105 mg/dL — ABNORMAL HIGH (ref 70–99)

## 2022-10-17 MED ORDER — MORPHINE SULFATE ER 30 MG PO TBCR
30.0000 mg | EXTENDED_RELEASE_TABLET | Freq: Three times a day (TID) | ORAL | 0 refills | Status: DC
Start: 2022-10-17 — End: 2023-01-20

## 2022-10-17 MED ORDER — FLUDEOXYGLUCOSE F - 18 (FDG) INJECTION
9.0000 | Freq: Once | INTRAVENOUS | Status: AC | PRN
  Administered 2022-10-17: 10.1 via INTRAVENOUS

## 2022-10-17 NOTE — Telephone Encounter (Signed)
From: Aggie Moats To: Mayra Reel, NP Sent: 10/17/2022 3:29 PM EDT Subject: Medication Renewal Request  Refills have been requested for the following medications:   morphine (MS CONTIN) 30 MG 12 hr tablet Jake Samples [place]]  Preferred pharmacy: [facility] [phone] - [place],  - [address] AT [facility] OF [address] Delivery method: Pickup

## 2022-10-19 IMAGING — PT NM PET TUM IMG INITIAL (PI) SKULL BASE T - THIGH
1 series · 7 of 7 positions shown · non-contrast
Comparison: CT angio chest 08/10/2020

CLINICAL DATA: Initial treatment strategy for non-small cell lung
cancer.

EXAM:
NUCLEAR MEDICINE PET SKULL BASE TO THIGH
TECHNIQUE: 9.85 mCi F-18 FDG was injected intravenously. Full-ring PET imaging
was performed from the skull base to thigh after the radiotracer. CT
data was obtained and used for attenuation correction and anatomic
localization.
Fasting blood glucose: 100 mg/dl

[Series 1093: results mm oncology reading · 0.9mm · 0.42mm/px · 7 of 7 slices shown]
[im 1/7]
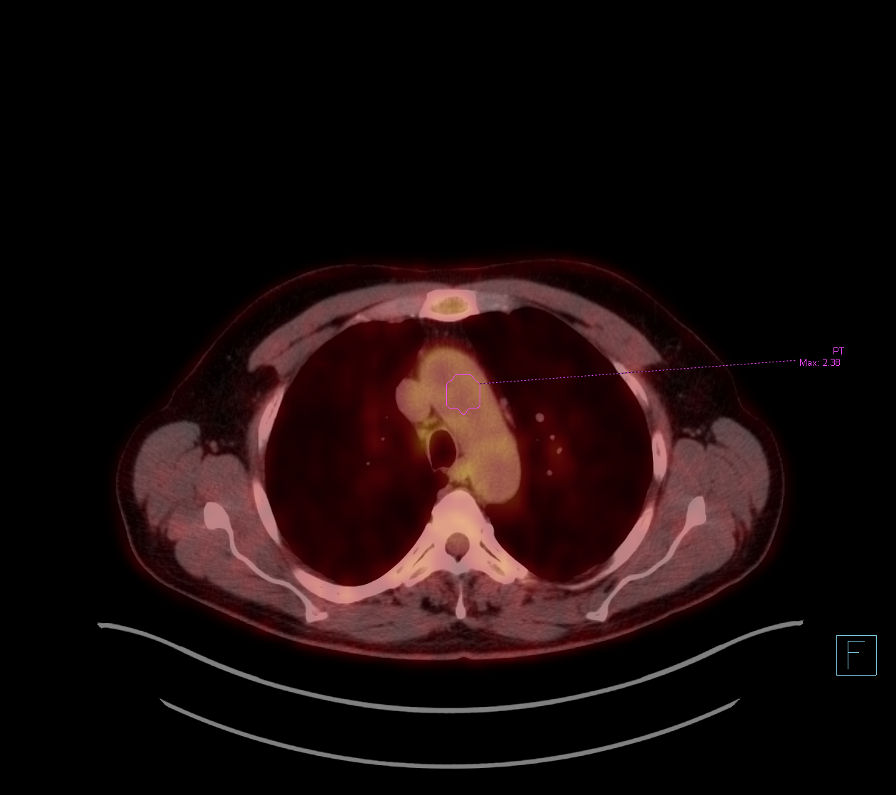
[im 2/7]
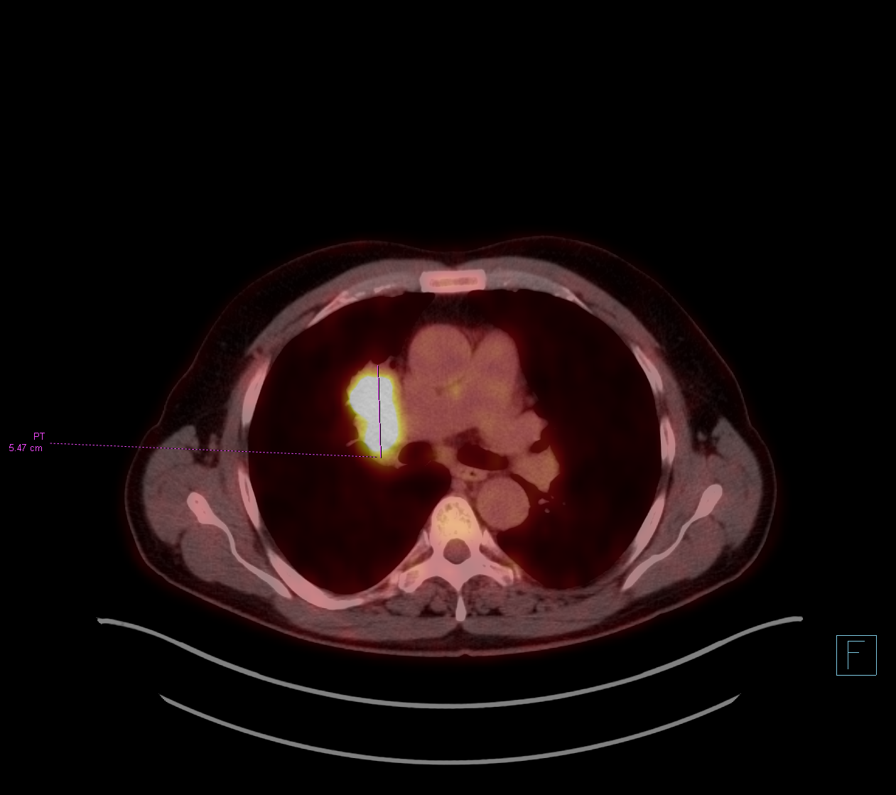
[im 3/7]
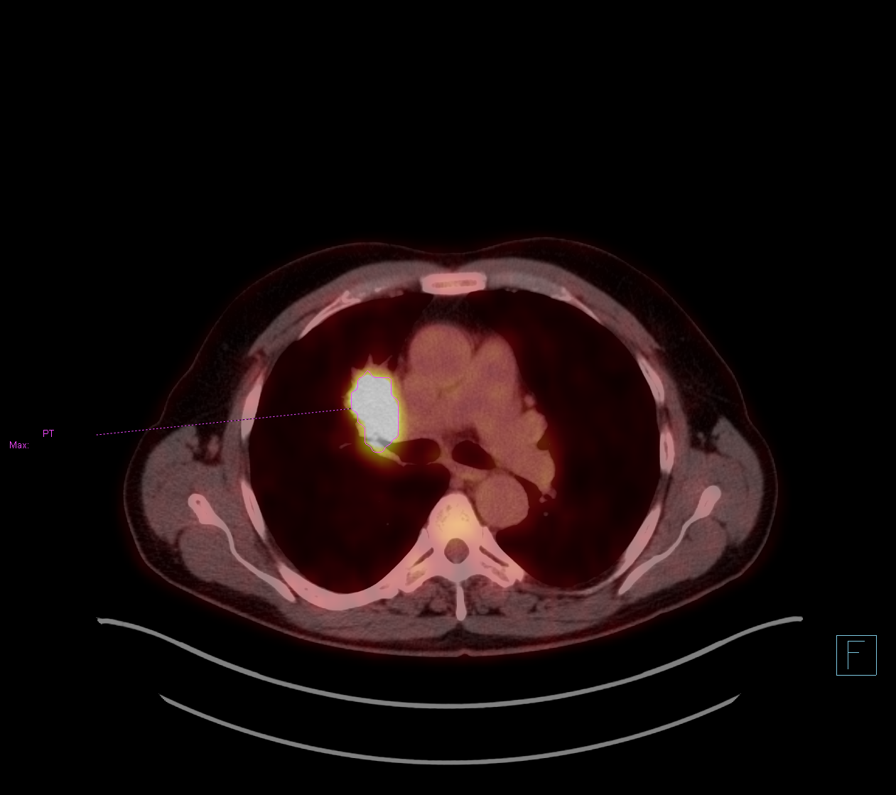
[im 4/7]
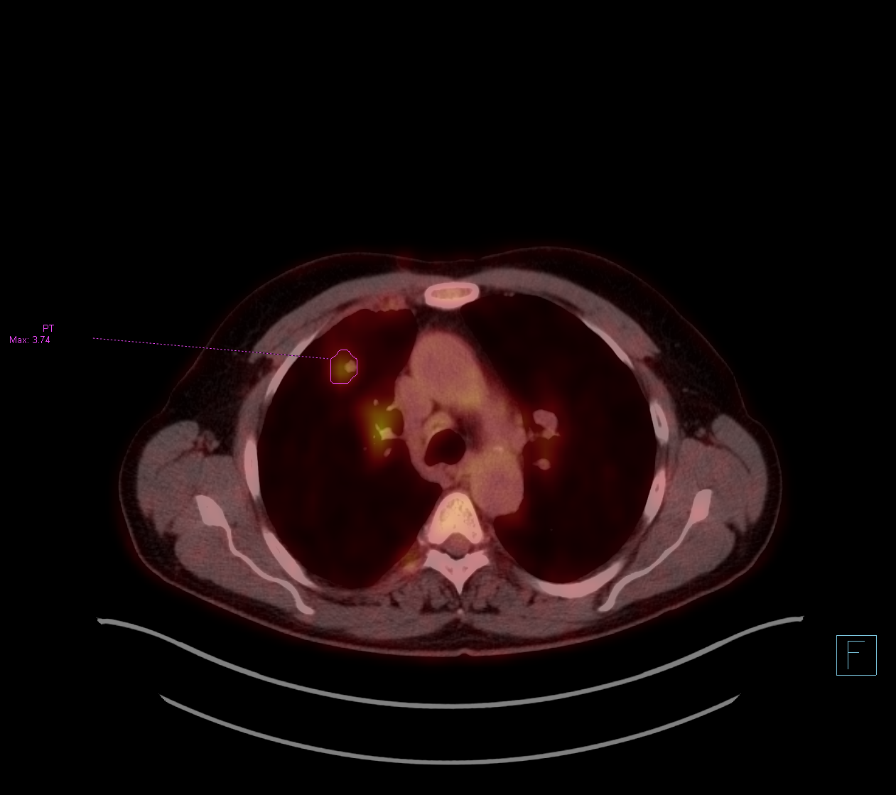
[im 5/7]
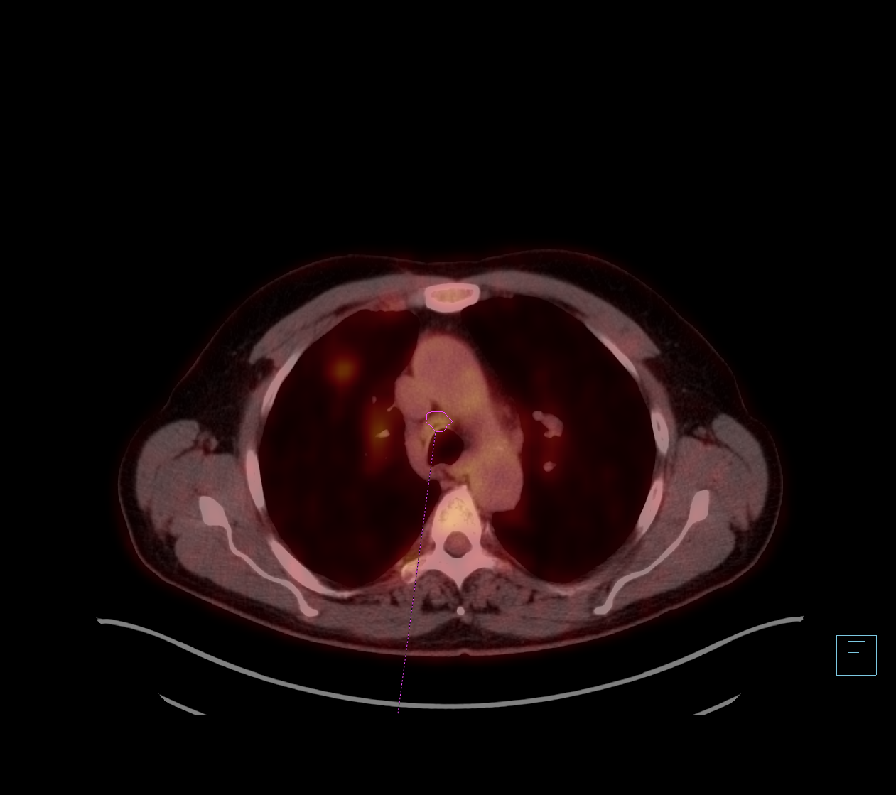
[im 6/7]
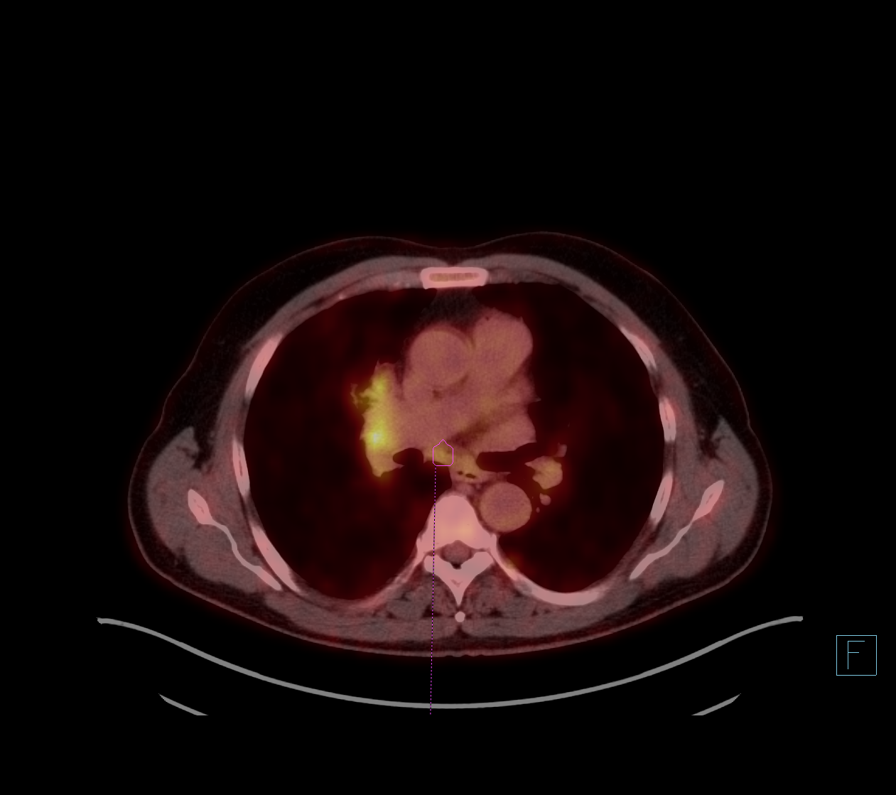
[im 7/7]
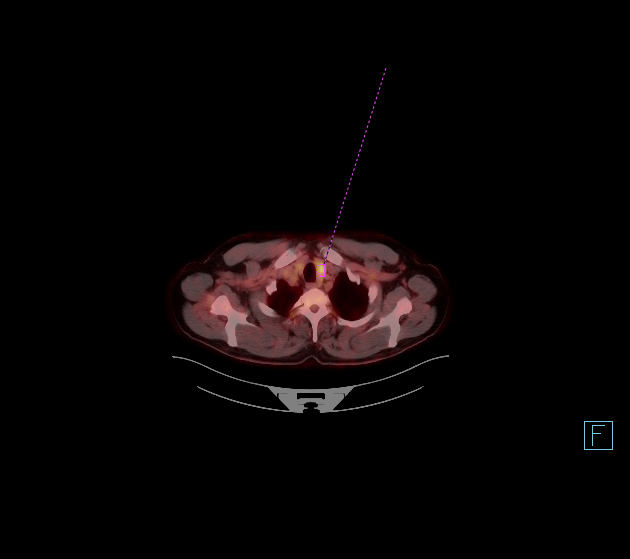

[7 of 7 positions shown; findings below may reference images not displayed]

FINDINGS: Mediastinal blood pool activity: SUV max

Liver activity: SUV max NA

NECK: Asymmetric increased uptake localizing to the inferior pole of
left lobe of thyroid gland may reflect FDG avid thyroid nodule. The
SUV max is equal to 6.37.

No FDG avid lymph nodes identified within the soft tissues of the
neck.

Incidental CT findings: none

CHEST: Right upper lobe perihilar lung mass is FDG avid measuring
5.47 with SUV max of 14.4.

Signs of postobstructive pneumonitis is noted with increased
ground-glass attenuation in the anterior and anteromedial right
upper lobe. Within this area there is a 1.2 cm nodule with SUV max
of 3.74, image [DATE].

5.6 mm low right paratracheal lymph node has an SUV max of 3.28,
image 67/4.

8 mm subcarinal lymph node has an SUV max of 3.88, image 72/4.

Incidental CT findings: Centrilobular emphysema. Aortic
atherosclerosis. No pericardial effusion.

ABDOMEN/PELVIS: No abnormal hypermetabolic activity within the
liver, pancreas, adrenal glands, or spleen. No hypermetabolic lymph
nodes in the abdomen or pelvis.

Incidental CT findings: Aortic atherosclerosis. No aneurysm. Small
fat containing umbilical hernia.

SKELETON: No focal hypermetabolic activity to suggest skeletal
metastasis.

Incidental CT findings: none
IMPRESSION: 1. Intense FDG uptake is associated with the right upper lobe
perihilar lung mass compatible with primary bronchogenic carcinoma.
2. Progressive postobstructive pneumonitis within the anterior and
anteromedial right upper lobe. Within this area there is a new
nodular density measuring 1.2 cm within SUV max of 3.74.
3. Small, less than 1 cm, low right paratracheal and subcarinal
lymph nodes exhibit mild FDG uptake just above blood pool activity.
4. Asymmetric FDG uptake localizes to the inferior pole of left lobe
of thyroid gland. Cannot exclude FDG avid thyroid nodule. Recommend
thyroid US (ref: [HOSPITAL]. [DATE]): 143-50).
5. Aortic Atherosclerosis (03O9Y-4FD.D) and Emphysema (03O9Y-5A9.B).

## 2022-10-24 ENCOUNTER — Other Ambulatory Visit: Payer: Self-pay

## 2022-10-24 ENCOUNTER — Inpatient Hospital Stay: Payer: Managed Care, Other (non HMO) | Attending: Physician Assistant | Admitting: Internal Medicine

## 2022-10-24 ENCOUNTER — Telehealth: Payer: Self-pay | Admitting: *Deleted

## 2022-10-24 VITALS — BP 121/93 | HR 95 | Temp 97.8°F | Resp 18 | Ht 69.0 in | Wt 202.6 lb

## 2022-10-24 DIAGNOSIS — Z923 Personal history of irradiation: Secondary | ICD-10-CM | POA: Insufficient documentation

## 2022-10-24 DIAGNOSIS — Z79899 Other long term (current) drug therapy: Secondary | ICD-10-CM | POA: Insufficient documentation

## 2022-10-24 DIAGNOSIS — Z7901 Long term (current) use of anticoagulants: Secondary | ICD-10-CM | POA: Insufficient documentation

## 2022-10-24 DIAGNOSIS — C3491 Malignant neoplasm of unspecified part of right bronchus or lung: Secondary | ICD-10-CM

## 2022-10-24 DIAGNOSIS — C3411 Malignant neoplasm of upper lobe, right bronchus or lung: Secondary | ICD-10-CM | POA: Insufficient documentation

## 2022-10-24 DIAGNOSIS — Z86718 Personal history of other venous thrombosis and embolism: Secondary | ICD-10-CM | POA: Diagnosis not present

## 2022-10-24 DIAGNOSIS — Z86711 Personal history of pulmonary embolism: Secondary | ICD-10-CM | POA: Insufficient documentation

## 2022-10-24 NOTE — Telephone Encounter (Signed)
Referral called to [facility]. LM for intake to call

## 2022-10-24 NOTE — Progress Notes (Signed)
[facility] [phone]   Fax:([phone]  OFFICE PROGRESS NOTE  [name], MD [address] [place] [address]  DIAGNOSIS:  1) recurrent non-small cell lung cancer, squamous cell carcinoma initially diagnosed as stage IIIb (T3, N2, M0) non-small cell lung cancer, squamous cell carcinoma presented with large right upper lobe perihilar mass with right hilar and subcarinal lymphadenopathy diagnosed in April 2022. 2) left lower extremity deep venous thrombosis diagnosed in October 2022.  Currently on treatment with Xarelto.  PRIOR THERAPY:  1) Concurrent chemoradiation with weekly carboplatin for AUC of 2 and paclitaxel 45 Mg/M2.  Status post 8 cycles.  Last dose was given November 10, 2020. 2) Consolidation treatment with immunotherapy with Imfinzi 1500 Mg IV every 4 weeks.  First dose December 14, 2020.  Status post 3 cycles.  This was discontinued secondary to intolerance with highly suspicious immunotherapy mediated pneumonitis. 3) Systemic chemotherapy with carboplatin for AUC of 5 and paclitaxel 175 Mg/M2 with Neulasta support every 3 weeks.  Status post 6 cycles.  CURRENT THERAPY: Hospice and palliative care  INTERVAL HISTORY: Paul Mclean 62 y.o. male returns to the clinic today for follow-up visit accompanied by his wife and daughter.  The patient is feeling fine today except for the baseline shortness of breath with mild cough.  He has good and bad days.  He also continues to have mild fatigue.  He has no nausea, vomiting, diarrhea or constipation.  He has no headache or visual changes.  He denied having any fever or chills.  He has no hemoptysis.  He had a PET scan performed recently and he is here for evaluation and discussion of his scan results and recommendation regarding his condition.  MEDICAL HISTORY: Past Medical History:  Diagnosis Date   Atrial flutter (HCC)    question of brief aflutter 02/28/22 in setting of acute pericarditis  (not captured on ECG)   Cancer (HCC)    right lung cancer   DVT (deep venous thrombosis) (HCC) 02/27/2021   LLE partially occlusive DVT 02/25/21, started on Xarelto   Dyspnea    due to cancer per pt   History of radiation therapy 11/12/2020   right lung  09/30/2020-11/12/2020  Dr Antony Blackbird   Hypothyroidism    Thyroid disease     ALLERGIES:  is allergic to carboplatin.  MEDICATIONS:  Current Outpatient Medications  Medication Sig Dispense Refill   albuterol (VENTOLIN HFA) 108 (90 Base) MCG/ACT inhaler Inhale 2 puffs into the lungs every 6 (six) hours as needed for wheezing or shortness of breath. 8 g 2   Budeson-Glycopyrrol-Formoterol (BREZTRI AEROSPHERE) 160-9-4.8 MCG/ACT AERO Inhale 2 puffs into the lungs in the morning and at bedtime. 10.7 g 11   diazepam (VALIUM) 2 MG tablet Take 1 tablet (2 mg total) by mouth every 12 (twelve) hours as needed for anxiety. 45 tablet 0   furosemide (LASIX) 20 MG tablet TAKE 1 TABLET BY MOUTH DAILY AS NEEDED FOR SWELLING 20 tablet 3   gabapentin (NEURONTIN) 300 MG capsule Take 2 capsules in the morning, one capsule midday, and 3 capsules at bedtime. 180 capsule 1   ibuprofen (ADVIL) 200 MG tablet Take 200 mg by mouth in the morning and at bedtime.     ipratropium-albuterol (DUONEB) 0.5-2.5 (3) MG/3ML SOLN Inhale 3 mLs into the lungs every 6 (six) hours as needed (wheezing/shortness of breath).     levothyroxine (SYNTHROID) 112 MCG tablet Take 112 mcg by mouth daily before  breakfast.     morphine (MS CONTIN) 30 MG 12 hr tablet Take 1 tablet (30 mg total) by mouth every 8 (eight) hours. 90 tablet 0   morphine (MSIR) 15 MG tablet Take 1 tablet (15 mg total) by mouth every 4 (four) hours as needed for moderate pain or severe pain (pain.). 90 tablet 0   ondansetron (ZOFRAN) 8 MG tablet Take 1 tablet (8 mg total) by mouth every 8 (eight) hours as needed for nausea or vomiting. May take starting on day 3 following chemotherapy 30 tablet 1   pantoprazole  (PROTONIX) 40 MG tablet Take 1 tablet (40 mg total) by mouth daily. 30 tablet 2   polyethylene glycol powder (MIRALAX) 17 GM/SCOOP powder Take 1 scoop (17GM) once daily. 255 g 3   potassium chloride (KLOR-CON M) 10 MEQ tablet Take 1 tablet (10 mEq total) by mouth daily. 5 tablet 0   predniSONE (DELTASONE) 10 MG tablet Take 5 mg by mouth in the morning.     XARELTO 20 MG TABS tablet TAKE 1 TABLET(20 MG) BY MOUTH DAILY WITH SUPPER (Patient taking differently: Take 20 mg by mouth in the morning.) 30 tablet 3   No current facility-administered medications for this visit.    SURGICAL HISTORY:  Past Surgical History:  Procedure Laterality Date   APPENDECTOMY     BRONCHIAL BIOPSY  09/01/2020   Procedure: BRONCHIAL BIOPSIES;  Surgeon: Josephine Igo, DO;  Location: [facility];  Service: Pulmonary;;   BRONCHIAL BIOPSY  04/05/2022   Procedure: BRONCHIAL BIOPSIES;  Surgeon: Josephine Igo, DO;  Location: [facility];  Service: Pulmonary;;   BRONCHIAL BRUSHINGS  09/01/2020   Procedure: BRONCHIAL BRUSHINGS;  Surgeon: Josephine Igo, DO;  Location: [facility];  Service: Pulmonary;;   BRONCHIAL NEEDLE ASPIRATION BIOPSY  09/01/2020   Procedure: BRONCHIAL NEEDLE ASPIRATION BIOPSIES;  Surgeon: Josephine Igo, DO;  Location: [facility];  Service: Pulmonary;;   BRONCHIAL WASHINGS  09/01/2020   Procedure: BRONCHIAL WASHINGS;  Surgeon: Josephine Igo, DO;  Location: [facility];  Service: Pulmonary;;   FINE NEEDLE ASPIRATION  04/05/2022   Procedure: FINE NEEDLE ASPIRATION (FNA) LINEAR;  Surgeon: Josephine Igo, DO;  Location: [facility];  Service: Pulmonary;;   HERNIA REPAIR     INCISION AND DRAINAGE ABSCESS N/A 08/25/2017   Procedure: INCISION AND DRAINAGE perineal  ABSCESS;  Surgeon: Jerilee Field, MD;  Location: [facility];  Service: Urology;  Laterality: N/A;   VIDEO BRONCHOSCOPY WITH ENDOBRONCHIAL ULTRASOUND N/A 09/01/2020   Procedure: VIDEO BRONCHOSCOPY WITH ENDOBRONCHIAL ULTRASOUND;   Surgeon: Josephine Igo, DO;  Location: [facility];  Service: Pulmonary;  Laterality: N/A;   VIDEO BRONCHOSCOPY WITH ENDOBRONCHIAL ULTRASOUND Right 04/05/2022   Procedure: VIDEO BRONCHOSCOPY WITH ENDOBRONCHIAL ULTRASOUND;  Surgeon: Josephine Igo, DO;  Location: [facility];  Service: Pulmonary;  Laterality: Right;    REVIEW OF SYSTEMS:  Constitutional: positive for fatigue Eyes: negative Ears, nose, mouth, throat, and face: negative Respiratory: positive for cough and dyspnea on exertion Cardiovascular: negative Gastrointestinal: negative Genitourinary:negative Integument/breast: negative Hematologic/lymphatic: negative Musculoskeletal:negative Neurological: positive for paresthesia Behavioral/Psych: negative Endocrine: negative Allergic/Immunologic: negative   PHYSICAL EXAMINATION: General appearance: alert, cooperative, fatigued, and no distress Head: Normocephalic, without obvious abnormality, atraumatic Neck: no adenopathy, no JVD, supple, symmetrical, trachea midline, and thyroid not enlarged, symmetric, no tenderness/mass/nodules Lymph nodes: Cervical, supraclavicular, and axillary nodes normal. Resp: clear to auscultation bilaterally Back: symmetric, no curvature. ROM normal. No CVA tenderness. Cardio: regular rate and rhythm, S1, S2 normal, no murmur, click,  rub or gallop GI: soft, non-tender; bowel sounds normal; no masses,  no organomegaly Extremities: extremities normal, atraumatic, no cyanosis or edema Neurologic: Alert and oriented X 3, normal strength and tone. Normal symmetric reflexes. Normal coordination and gait  ECOG PERFORMANCE STATUS: 1 - Symptomatic but completely ambulatory  Blood pressure (!) 121/93, pulse 95, temperature 97.8 F (36.6 C), temperature source Oral, resp. rate 18, height 5\' 9"  (1.753 m), weight 202 lb 9.6 oz (91.9 kg).  LABORATORY DATA: Lab Results  Component Value Date   WBC 6.3 09/23/2022   HGB 10.7 (L) 09/23/2022   HCT 31.2  (L) 09/23/2022   MCV 92.3 09/23/2022   PLT 248 09/23/2022      Chemistry      Component Value Date/Time   NA 138 09/23/2022 1109   K 3.6 09/23/2022 1109   CL 99 09/23/2022 1109   CO2 32 09/23/2022 1109   BUN 13 09/23/2022 1109   CREATININE 0.76 09/23/2022 1109      Component Value Date/Time   CALCIUM 9.2 09/23/2022 1109   ALKPHOS 70 09/23/2022 1109   AST 15 09/23/2022 1109   ALT 15 09/23/2022 1109   BILITOT 0.5 09/23/2022 1109       RADIOGRAPHIC STUDIES: NM PET Image Initial (PI) Skull Base To Thigh  Result Date: 10/22/2022 CLINICAL DATA:  Subsequent treatment strategy for metastatic lung cancer. EXAM: NUCLEAR MEDICINE PET SKULL BASE TO THIGH TECHNIQUE: 10.1 mCi F-18 FDG was injected intravenously. Full-ring PET imaging was performed from the skull base to thigh after the radiotracer. CT data was obtained and used for attenuation correction and anatomic localization. Fasting blood glucose: 105 mg/dl COMPARISON:  CT chest abdomen pelvis dated 09/23/2022. PET-CT dated 04/20/2022. FINDINGS: Mediastinal blood pool activity: SUV max 3.6 Liver activity: SUV max NA NECK: No hypermetabolic cervical lymphadenopathy. Incidental CT findings: None. CHEST: 6.1 x 3.7 cm masslike opacity in the medial right upper lung abutting the mediastinum (series 4/image 32), max SUV 15.7. Associated radiation changes and right upper lobe collapse with volume loss. Bilateral supraclavicular nodes, measuring up to 2.6 cm short axis on the right (series 4/image 82), max SUV 2.4. This previously measured 2.0 cm short axis on recent CT. 8 mm short axis subcarinal node (series 4/image 76), max SUV 4.3. Small right pleural effusion, max SUV 3.0. Associated 6 x 11 mm pleural-based nodule along the right aspect of the T6 vertebral body (series 4/image 72), new, max SUV 6.3. Incidental CT findings: None. ABDOMEN/PELVIS: No abnormal hypermetabolism in the liver, spleen, pancreas, or adrenal glands. No hypermetabolic  abdominopelvic lymphadenopathy. Incidental CT findings: Mild hepatic steatosis. Atherosclerotic calcifications the abdominal aorta and branch vessels. Small fat containing right inguinal hernia. SKELETON: No focal hypermetabolic activity to suggest skeletal metastasis. Incidental CT findings: Degenerative changes of the visualized thoracolumbar spine. IMPRESSION: 6.1 cm masslike opacity in the medial right upper lung abutting the mediastinum, corresponding to the patient's known primary bronchogenic carcinoma. Associated radiation changes and right upper lobe collapse. Progressive bilateral supraclavicular and subcarinal nodal metastases, as above. Small right pleural effusion with suspected new pleural metastasis. No evidence of metastatic disease in the abdomen/pelvis. Electronically Signed   By: Charline Bills M.D.   On: 10/22/2022 23:47     ASSESSMENT AND PLAN: This is a very pleasant 62 years old white male recently diagnosed with stage IIIb (T3, N2, M0) non-small cell lung cancer, squamous cell carcinoma presented with large right upper lobe perihilar mass with right hilar and subcarinal lymphadenopathy diagnosed in April 2022. The  patient underwent a course of concurrent chemoradiation with weekly carboplatin and paclitaxel status post 8 cycles.  The patient tolerated his treatment well except for fatigue. His scan showed partial response with improvement in the right hilar mass as well as resolution of the right upper lobe lung nodule. The patient started on treatment with consolidation immunotherapy with Imfinzi 1500 Mg IV every 4 weeks status post 3 cycles.  He has been tolerating the treatment well except for the worsening cough and shortness of breath. His restaging scan after cycle #3 was concerning for immunotherapy mediated pneumonitis his treatment was discontinued and the patient was treated with a tapered dose of prednisone The patient has been on observation since that time but  recently started having more chest pain as well as shortness of breath and cough.  He has repeat bronchoscopy by Dr. Tonia Brooms that showed evidence for residual/recurrent squamous cell carcinoma of the lung in the right upper lobe.  The patient also had a PET scan and MRI performed recently.  I personally and independently reviewed the images and discussed the result and showed the the images to the patient and his wife. His scan showed worsening and progressive hypermetabolic activity in the right upper lobe/hilar mass concerning for progressive disease locally. He was seen by Dr. Roselind Messier for consideration of palliative radiotherapy to the recurrent disease in the right lung but Dr. Roselind Messier did not feel that he can offer the patient any additional radiation because of the high-dose he received in the past. The patient started systemic chemotherapy with carboplatin for AUC of 5, paclitaxel 175 Mg/M2 and Keytruda 200 Mg IV every 3 weeks.  Status post 6 cycles.  He tolerated the treatment well except for fatigue. He had repeat imaging studies including CT scan of the chest, abdomen pelvis as well as PET scan that showed evidence for disease progression after completion of cycle #6. I had a lengthy discussion with the patient and his family about his condition and treatment options.  I gave the patient the option of palliative care and hospice referral versus consideration of systemic chemotherapy with single agent gemcitabine.  The patient and his family have no interest in proceeding with any additional chemo at this point and they prefer hospice referral. I will refer him to the hospice service of [facility]. I will see the patient on as-needed basis at this point. For the pain management, he will continue on MS Contin as well as MS IR.  He is also seen by the palliative care team for management of his pain issues. For the history of pulmonary embolism, he will continue his current treatment with  Xarelto. The patient was advised to call immediately if he has any other concerning symptoms in the interval. All questions were answered. The patient knows to call the clinic with any problems, questions or concerns. We can certainly see the patient much sooner if necessary.  The total time spent in the appointment was 30 minutes.  Disclaimer: This note was dictated with voice recognition software. Similar sounding words can inadvertently be transcribed and may not be corrected upon review.

## 2022-10-25 ENCOUNTER — Telehealth: Payer: Self-pay | Admitting: Medical Oncology

## 2022-10-25 NOTE — Telephone Encounter (Signed)
Will Dr. Arbutus Ped be the Hospice attending for this pt .

## 2022-10-25 NOTE — Telephone Encounter (Signed)
Will Dr Arbutus Ped be attending? ILVM that Arbutus Ped said : "Yes if needed but Dr. Cleone Slim can do it too since he is the medical director for [facility] hospice".

## 2022-10-28 NOTE — Progress Notes (Unsigned)
Palliative Medicine [facility]  [phone] [phone]   Name: Paul Mclean Date: 10/28/2022 MRN: [id number]  DOB: March 01, 1961  Patient Care Team: [name], MD as PCP - General (Internal Medicine) Pickenpack-Cousar, Arty Baumgartner, NP as Nurse Practitioner (Nurse Practitioner)   I connected with Paul Mclean on 10/28/22 at 11:30 AM EDT by phone and verified that I am speaking with the correct person using two identifiers.   I discussed the limitations, risks, security and privacy concerns of performing an evaluation and management service by telemedicine and the availability of in-person appointments. I also discussed with the patient that there may be a patient responsible charge related to this service. The patient expressed understanding and agreed to proceed.   Other persons participating in the visit and their role in the encounter: n/a   Patient's location: home   Provider's location: [facility]   Chief Complaint: f/u of symptom management    INTERVAL HISTORY: Paul Mclean is a 62 y.o. 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  Palliative ask to see for pain management and goals of care.  SOCIAL HISTORY:    Paul Mclean reports that he quit smoking about 16 years ago. His smoking use included cigarettes. He has a 60.00 pack-year smoking history. He has never used smokeless tobacco. He reports current alcohol use of about 1.0 - 2.0 standard drink of alcohol per week. He reports that he does not use drugs.  ADVANCE DIRECTIVES:  Advanced directives on file  CODE STATUS: Full code  PAST MEDICAL HISTORY: Past Medical History:  Diagnosis Date   Atrial flutter (HCC)    question of brief aflutter 02/28/22 in setting of acute pericarditis (not captured on ECG)   Cancer (HCC)    right lung cancer   DVT (deep venous thrombosis) (HCC)  02/27/2021   LLE partially occlusive DVT 02/25/21, started on Xarelto   Dyspnea    due to cancer per pt   History of radiation therapy 11/12/2020   right lung  09/30/2020-11/12/2020  Dr Antony Blackbird   Hypothyroidism    Thyroid disease     ALLERGIES:  is allergic to carboplatin.  MEDICATIONS:  Current Outpatient Medications  Medication Sig Dispense Refill   albuterol (VENTOLIN HFA) 108 (90 Base) MCG/ACT inhaler Inhale 2 puffs into the lungs every 6 (six) hours as needed for wheezing or shortness of breath. 8 g 2   Budeson-Glycopyrrol-Formoterol (BREZTRI AEROSPHERE) 160-9-4.8 MCG/ACT AERO Inhale 2 puffs into the lungs in the morning and at bedtime. 10.7 g 11   diazepam (VALIUM) 2 MG tablet Take 1 tablet (2 mg total) by mouth every 12 (twelve) hours as needed for anxiety. 45 tablet 0   furosemide (LASIX) 20 MG tablet TAKE 1 TABLET BY MOUTH DAILY AS NEEDED FOR SWELLING 20 tablet 3   gabapentin (NEURONTIN) 300 MG capsule Take 2 capsules in the morning, one capsule midday, and 3 capsules at bedtime. 180 capsule 1   ibuprofen (ADVIL) 200 MG tablet Take 200 mg by mouth in the morning and at bedtime.     ipratropium-albuterol (DUONEB) 0.5-2.5 (3) MG/3ML SOLN Inhale 3 mLs into the lungs every 6 (six) hours as needed (wheezing/shortness of breath).     levothyroxine (SYNTHROID) 112 MCG tablet Take 112 mcg by mouth daily before breakfast.     morphine (MS CONTIN) 30 MG 12 hr tablet Take  1 tablet (30 mg total) by mouth every 8 (eight) hours. 90 tablet 0   morphine (MSIR) 15 MG tablet Take 1 tablet (15 mg total) by mouth every 4 (four) hours as needed for moderate pain or severe pain (pain.). 90 tablet 0   ondansetron (ZOFRAN) 8 MG tablet Take 1 tablet (8 mg total) by mouth every 8 (eight) hours as needed for nausea or vomiting. May take starting on day 3 following chemotherapy 30 tablet 1   pantoprazole (PROTONIX) 40 MG tablet Take 1 tablet (40 mg total) by mouth daily. 30 tablet 2   polyethylene glycol  powder (MIRALAX) 17 GM/SCOOP powder Take 1 scoop (17GM) once daily. 255 g 3   potassium chloride (KLOR-CON M) 10 MEQ tablet Take 1 tablet (10 mEq total) by mouth daily. 5 tablet 0   predniSONE (DELTASONE) 10 MG tablet Take 5 mg by mouth in the morning.     XARELTO 20 MG TABS tablet TAKE 1 TABLET(20 MG) BY MOUTH DAILY WITH SUPPER (Patient taking differently: Take 20 mg by mouth in the morning.) 30 tablet 3   No current facility-administered medications for this visit.    VITAL SIGNS: There were no vitals taken for this visit. There were no vitals filed for this visit.   Estimated body mass index is 29.92 kg/m as calculated from the following:   Height as of 10/24/22: 5\' 9"  (1.753 m).   Weight as of 10/24/22: 202 lb 9.6 oz (91.9 kg).   PERFORMANCE STATUS (ECOG) : 1 - Symptomatic but completely ambulatory   Physical Exam General: NAD Cardiovascular: regular rate and rhythm Pulmonary: normal breathing pattern Abdomen: soft, nontender, + bowel sounds Extremities: no edema, no joint deformities Skin: no rashes Neurological: AAO x 4, slowed responses  IMPRESSION:   Neoplasm related pain Paul Mclean reports that his pain is managed with current medication regimen. Some days are better than others. He shares several days ago he had a bad "pain crisis".   We discussed his current regimen: MS Contin every 8 hours, MS IR 15 mg as needed for breakthrough pain (requiring this about once per day), and Gabapentin 600 mg in the morning, 900 mg at bedtime. Is also using Claritin before chemotherapy treatments. We discussed increasing gabapentin to 900mg  twice daily and 300mg  midday. For additional support.   Will continue to closely monitor.   2.   Anxiety Controlled with Valium as needed twice daily.   3. Decreased appetite/weight loss Appetite is improving.  Current weight is 203 pounds up from 199 pounds on 4/24.  We discussed the importance of continued conversation with family and their  medical providers regarding overall plan of care and treatment options, ensuring decisions are within the context of the patients values and GOCs.  PLAN:  MS Contin 30 mg three times daily. MS IR 15-30mg  every 4-6 hours as needed for breakthrough pain.  Gabapentin 900 mg every morning, 300 mg midday, and 900 mg at bedtime. Valium to 2 mg BID as needed for anxiety. Miralax daily for bowel regimen. We will continue to closely monitor and assist with symptom management.  Palliative team will plan to see patient back in 3-4 weeks in collaboration to other oncology appointments. Patient and family knows to contact office if needed sooner.    Patient expressed understanding and was in agreement with this plan. He also understands that He can call the clinic at any time with any questions, concerns, or complaints.   Any controlled substances utilized were prescribed in  the context of palliative care. PDMP has been reviewed.     Visit consisted of counseling and education dealing with the complex and emotionally intense issues of symptom management and palliative care in the setting of serious and potentially life-threatening illness.Greater than 50%  of this time was spent counseling and coordinating care related to the above assessment and plan.   Willette Alma, AGPCNP-BC  Palliative Medicine Team/[facility]  *Please note that this is a verbal dictation therefore any spelling or grammatical errors are due to the "[employer] One" system interpretation.

## 2022-11-02 ENCOUNTER — Inpatient Hospital Stay: Payer: Managed Care, Other (non HMO) | Admitting: Nurse Practitioner

## 2022-11-04 NOTE — Progress Notes (Signed)
Pt under hospice care per note

## 2023-01-08 DEATH — deceased
